# Patient Record
Sex: Male | Born: 1950 | Race: Black or African American | Hispanic: No | Marital: Married | Smoking: Current every day smoker
Health system: Southern US, Community
[De-identification: ages and names within clinical notes are randomized; demographics above are authoritative.]

## PROBLEM LIST (undated history)

## (undated) DIAGNOSIS — E559 Vitamin D deficiency, unspecified: Secondary | ICD-10-CM

## (undated) DIAGNOSIS — K219 Gastro-esophageal reflux disease without esophagitis: Secondary | ICD-10-CM

## (undated) DIAGNOSIS — E291 Testicular hypofunction: Secondary | ICD-10-CM

## (undated) DIAGNOSIS — F17210 Nicotine dependence, cigarettes, uncomplicated: Secondary | ICD-10-CM

## (undated) DIAGNOSIS — N528 Other male erectile dysfunction: Secondary | ICD-10-CM

## (undated) DIAGNOSIS — E119 Type 2 diabetes mellitus without complications: Secondary | ICD-10-CM

## (undated) DIAGNOSIS — E785 Hyperlipidemia, unspecified: Secondary | ICD-10-CM

## (undated) DIAGNOSIS — I10 Essential (primary) hypertension: Secondary | ICD-10-CM

## (undated) HISTORY — DX: Testicular hypofunction: E29.1

## (undated) HISTORY — DX: Vitamin D deficiency, unspecified: E55.9

## (undated) HISTORY — DX: Nicotine dependence, cigarettes, uncomplicated: F17.210

## (undated) HISTORY — DX: Other male erectile dysfunction: N52.8

---

## 1998-04-15 DIAGNOSIS — K279 Peptic ulcer, site unspecified, unspecified as acute or chronic, without hemorrhage or perforation: Secondary | ICD-10-CM

## 1998-04-15 HISTORY — DX: Peptic ulcer, site unspecified, unspecified as acute or chronic, without hemorrhage or perforation: K27.9

## 2009-07-14 ENCOUNTER — Ambulatory Visit: Payer: Self-pay | Admitting: Diagnostic Radiology

## 2009-07-14 ENCOUNTER — Emergency Department (HOSPITAL_BASED_OUTPATIENT_CLINIC_OR_DEPARTMENT_OTHER): Admission: EM | Admit: 2009-07-14 | Discharge: 2009-07-14 | Payer: Self-pay | Admitting: Emergency Medicine

## 2010-01-27 ENCOUNTER — Emergency Department (HOSPITAL_BASED_OUTPATIENT_CLINIC_OR_DEPARTMENT_OTHER): Admission: EM | Admit: 2010-01-27 | Discharge: 2010-01-27 | Payer: Self-pay | Admitting: Emergency Medicine

## 2010-06-27 LAB — URINE CULTURE
Colony Count: NO GROWTH
Culture: NO GROWTH

## 2010-06-27 LAB — URINALYSIS, ROUTINE W REFLEX MICROSCOPIC
Glucose, UA: NEGATIVE mg/dL
Nitrite: NEGATIVE
Urobilinogen, UA: 1 mg/dL (ref 0.0–1.0)
pH: 6 (ref 5.0–8.0)

## 2010-12-22 ENCOUNTER — Inpatient Hospital Stay (HOSPITAL_COMMUNITY)
Admission: AD | Admit: 2010-12-22 | Discharge: 2010-12-23 | DRG: 392 | Disposition: A | Discharge status: Home / Self Care | Payer: Self-pay | Source: Other Acute Inpatient Hospital | Attending: Internal Medicine | Admitting: Internal Medicine

## 2010-12-22 ENCOUNTER — Other Ambulatory Visit: Payer: Self-pay

## 2010-12-22 ENCOUNTER — Encounter: Payer: Self-pay | Admitting: Emergency Medicine

## 2010-12-22 ENCOUNTER — Emergency Department (HOSPITAL_BASED_OUTPATIENT_CLINIC_OR_DEPARTMENT_OTHER)
Admission: EM | Admit: 2010-12-22 | Discharge: 2010-12-22 | Disposition: A | Discharge status: Other Acute Inpatient Hospital | Payer: Self-pay | Attending: Emergency Medicine | Admitting: Emergency Medicine

## 2010-12-22 ENCOUNTER — Emergency Department (INDEPENDENT_AMBULATORY_CARE_PROVIDER_SITE_OTHER): Payer: Self-pay

## 2010-12-22 DIAGNOSIS — R112 Nausea with vomiting, unspecified: Secondary | ICD-10-CM

## 2010-12-22 DIAGNOSIS — K219 Gastro-esophageal reflux disease without esophagitis: Principal | ICD-10-CM | POA: Diagnosis present

## 2010-12-22 DIAGNOSIS — R61 Generalized hyperhidrosis: Secondary | ICD-10-CM

## 2010-12-22 DIAGNOSIS — E785 Hyperlipidemia, unspecified: Secondary | ICD-10-CM | POA: Insufficient documentation

## 2010-12-22 DIAGNOSIS — R109 Unspecified abdominal pain: Secondary | ICD-10-CM | POA: Insufficient documentation

## 2010-12-22 DIAGNOSIS — R0789 Other chest pain: Secondary | ICD-10-CM | POA: Diagnosis present

## 2010-12-22 DIAGNOSIS — F17200 Nicotine dependence, unspecified, uncomplicated: Secondary | ICD-10-CM | POA: Insufficient documentation

## 2010-12-22 DIAGNOSIS — R0602 Shortness of breath: Secondary | ICD-10-CM

## 2010-12-22 DIAGNOSIS — R079 Chest pain, unspecified: Secondary | ICD-10-CM | POA: Insufficient documentation

## 2010-12-22 HISTORY — DX: Gastro-esophageal reflux disease without esophagitis: K21.9

## 2010-12-22 HISTORY — DX: Hyperlipidemia, unspecified: E78.5

## 2010-12-22 LAB — HEPATIC FUNCTION PANEL
Albumin: 4 g/dL (ref 3.5–5.2)
Alkaline Phosphatase: 87 U/L (ref 39–117)
Indirect Bilirubin: 0.3 mg/dL (ref 0.3–0.9)

## 2010-12-22 LAB — LIPASE, BLOOD: Lipase: 69 U/L — ABNORMAL HIGH (ref 11–59)

## 2010-12-22 LAB — BASIC METABOLIC PANEL
GFR calc Af Amer: >60 mL/min (ref >60)
GFR calc non Af Amer: 57 mL/min — ABNORMAL LOW (ref >60)
Potassium: 4.6 mEq/L (ref 3.5–5.1)

## 2010-12-22 LAB — CARDIAC PANEL(CRET KIN+CKTOT+MB+TROPI)
Relative Index: 2.1 (ref 0.0–2.5)
Troponin I: <0.3 ng/mL (ref <0.30)

## 2010-12-22 LAB — CBC
HCT: 43.1 % (ref 39.0–52.0)
MCHC: 35.5 g/dL (ref 30.0–36.0)
MCV: 92.3 fL (ref 78.0–100.0)
Platelets: 145 10*3/uL — ABNORMAL LOW (ref 150–400)
RBC: 4.67 MIL/uL (ref 4.22–5.81)
RDW: 11.6 % (ref 11.5–15.5)
WBC: 3.3 10*3/uL — ABNORMAL LOW (ref 4.0–10.5)

## 2010-12-22 MED ORDER — ASPIRIN 325 MG PO TABS: 325.0000 mg | ORAL_TABLET | ORAL | Status: DC

## 2010-12-22 MED ORDER — ASPIRIN 81 MG PO CHEW
CHEWABLE_TABLET | ORAL | Status: AC
  Administered 2010-12-22: 324 mg via ORAL
  Filled 2010-12-22: qty 4

## 2010-12-22 MED ORDER — ASPIRIN 81 MG PO CHEW
324.0000 mg | CHEWABLE_TABLET | Freq: Once | ORAL | Status: AC
  Administered 2010-12-22: 324 mg via ORAL

## 2010-12-22 NOTE — ED Provider Notes (Signed)
History     CSN: [id number] Arrival date & time: 12/22/2010 12:08 PM  Chief Complaint  Patient presents with  . Abdominal Pain  . Chest Pain   HPI History provided by pt.  Pt woke w/ mild, sharp, non-radiating pain in left lateral chest this morning.  Only feels it if he moves or takes a deep breath.   Seems to worsen w/ exertion.  Currently has discomfort in left anterior chest.  Has had a cough productive of white sputum recently but no associated fever, SOB, diaphoresis, N/V.  Pt had an episode of "feeling bad" three days ago.  Did not have CP/SOB at that time but became diaphoretic, lightheaded and nauseous. Risk factors for MI include smoking and hyperlipidemia.  No risk factors for PE and denies LE pain/edema.  No known trauma.   Has also had intermittent pain in center of abd that is aggravated by eating spicy food x 2 weeks.  No vomiting or diarrhea.  H/o acid reflux and this feels similar.   Past Medical History  Diagnosis Date  . Hyperlipemia   . GERD (gastroesophageal reflux disease)     History reviewed. No pertinent past surgical history.  History reviewed. No pertinent family history.  History  Substance Use Topics  . Smoking status: Current Everyday Smoker -- 1.0 packs/day for 35 years    Types: Cigarettes  . Smokeless tobacco: Not on file  . Alcohol Use: 14.4 oz/week    24 Cans of beer per week     daily      Review of Systems  All other systems reviewed and are negative.    Physical Exam  BP 145/93  Pulse 84  Temp(Src) 98.3 F (36.8 C) (Oral)  Resp 22  Ht 6\' 3"  (1.905 m)  Wt 210 lb (95.255 kg)  BMI 26.25 kg/m2  SpO2 98%  Physical Exam  Nursing note and vitals reviewed. Constitutional: He is oriented to person, place, and time. He appears well-developed and well-nourished. No distress.  HENT:  Head: Normocephalic and atraumatic.  Eyes:       Normal appearance  Neck: Normal range of motion.  Cardiovascular: Normal rate and regular rhythm.     Pulmonary/Chest: Effort normal and breath sounds normal. No respiratory distress.       Mild left lateral chest ttp  Abdominal: Soft. Bowel sounds are normal. He exhibits no distension. There is no guarding.       Mild ttp of left upper/lower quadrant  Musculoskeletal: He exhibits no edema and no tenderness.       2+ Dorsalis Pedis pulses  Neurological: He is alert and oriented to person, place, and time.  Skin: Skin is warm and dry. No rash noted. He is not diaphoretic.  Psychiatric: He has a normal mood and affect. His behavior is normal.    Date: 12/23/2010  Rate: 86  Rhythm: normal sinus rhythm  QRS Axis: normal  Intervals: normal  ST/T Wave abnormalities: normal  Conduction Disutrbances:none  Narrative Interpretation:   Old EKG Reviewed: none available   ED Course  Procedures  MDM 59yo smoker w/ hyperlipidema, otherwise healthy, presents w/ pleuritic left lateral CP since waking this am.  No associated symptoms.  Had an episode of diaphoresis, nausea and lightheadedness 3 days ago.  No risk factors for PE, not tachycardic/tachypneic and no LE edema/ttp on exam.  CP is not reproducible w/ palpation and movement.  EKG w/out signs of ischemia, first troponin neg and labs otherwise unremarkable.  CXR shows  early CHF which is new since 07/2009.  VSS and pt currently feeling better.  Dr. Juleen China has seen pt and agrees that he should be evaluated further.  Will consult [facility] for admission b/c no CP protocol on weekend.      [facility] to admit for CP.  4:37 PM     Otilio Miu, PA 12/23/10 1121

## 2010-12-22 NOTE — ED Notes (Signed)
Family at bedside. Update on transfer to [facility]- pt denies pain- iv nsl

## 2010-12-22 NOTE — ED Notes (Signed)
Pending admission bed assignment- pt aware of admission

## 2010-12-22 NOTE — ED Notes (Signed)
Attempt to call report to [facility]- room assignment 1420 received- RN will call back when able to take report

## 2010-12-22 NOTE — ED Notes (Addendum)
Pt c/o generalized abdominal pain since Wednesday, started having chest pain along left ribs today.  Some diaphoresis, nausea and lightheadedness on Wed.  No Diarrhea.  Some SOB today.  No dizziness today.  Eating and drinking ok.  Gait steady.  No weakness today.  No recent travel.  No known fever.  Some productive cough, white sputum.  No peripheral edema.

## 2010-12-22 NOTE — ED Notes (Signed)
Report given to [name]

## 2010-12-23 LAB — CARDIAC PANEL(CRET KIN+CKTOT+MB+TROPI)
CK, MB: 2 ng/mL (ref 0.3–4.0)
CK, MB: 2 ng/mL (ref 0.3–4.0)
CK, MB: 2 ng/mL (ref 0.3–4.0)
Relative Index: INVALID (ref 0.0–2.5)
Relative Index: INVALID (ref 0.0–2.5)
Total CK: 95 U/L (ref 7–232)
Total CK: 89 U/L (ref 7–232)
Troponin I: <0.3 ng/mL (ref <0.30)
Troponin I: <0.3 ng/mL (ref <0.30)

## 2010-12-23 LAB — LIPASE, BLOOD: Lipase: 59 U/L (ref 11–59)

## 2010-12-23 LAB — LIPID PANEL
Cholesterol: 159 mg/dL (ref 0–200)
VLDL: 23 mg/dL (ref 0–40)

## 2010-12-23 LAB — CBC
HCT: 41.7 % (ref 39.0–52.0)
MCHC: 35 g/dL (ref 30.0–36.0)
MCV: 93.3 fL (ref 78.0–100.0)
RBC: 4.47 MIL/uL (ref 4.22–5.81)
WBC: 3.2 10*3/uL — ABNORMAL LOW (ref 4.0–10.5)

## 2010-12-23 NOTE — Discharge Summary (Signed)
NAMESHERLOCK, NANCARROW               ACCOUNT NO.:  [id number]  MEDICAL RECORD NO.:  [id number]  LOCATION:  1420                         FACILITY:  [facility]  PHYSICIAN:  Talmage Nap, MD  DATE OF BIRTH:  November 30, 1950  DATE OF ADMISSION:  12/22/2010 DATE OF DISCHARGE:  12/23/2010                        DISCHARGE SUMMARY - REFERRING   PRIMARY CARE PHYSICIAN:  None.  DISCHARGE DIAGNOSES: 1. Chest pain, most likely noncardiac.  Cardiac enzymes are negative.     The patient to have stress test to be arranged as outpatient when     appointment is made for him to follow up at [facility]. 2. Gastroesophageal reflux disease. 3. Hyperlipidemia.  HISTORY OF PRESENT ILLNESS:  The patient is a 60 year old African American male with history of GERD and hyperlipidemia, and also chronic tobacco use, who was admitted to the hospital on December 22, 2010, by Dr. Calvert Cantor with complaints of chest pain.  The patient described pain as constant, located in the left chest and was reproducible.  He denied any associated shortness of breath.  He denied any radiation of pain.  He denied any diaphoresis, nausea, or vomiting.  No fever, no chills, no rigor.  The patient claimed he took Aleve, pain did not improve; hence [facility].  Before the [facility] got [facility], pain had resolved and at [facility], he was given aspirin and subsequently asked to come to [facility] for further evaluation.  MEDICATIONS:  Preadmission med without dosages include Nexium.  SOCIAL HISTORY:  The patient smokes about a pack of cigarettes per day for the past 30-35 years and drinks 2 ounces of beer every evening, and works in a funeral home.  FAMILY HISTORY:  York Spaniel to be positive for lung carcinoma and throat carcinoma.  Past surgical history and review of systems, please refer to the initial history and physical dictated by Dr. Calvert Cantor.  PHYSICAL EXAMINATION:  At the time the  patient was seen by the admitting physician; VITAL SIGNS:  Temperature was 98.3, pulse 70, respiratory 18, blood pressure 166/89, and pulse ox was 99%.  Repeat of blood pressure was 136/84. GENERAL:  He was said not to be in any distress. HEENT:  Pupils are reactive to light and extraocular muscles are intact. NECK:  He had no jugular venous distention, no carotid bruit, and no lymphadenopathy. CHEST:  Clear to auscultation. HEART:  Sounds are 1 and 2. ABDOMEN:  Soft, nontender.  Liver, spleen, kidney not palpable.  Bowel sounds are positive. EXTREMITIES:  No pedal edema. NEUROLOGIC:  Exam was nonfocal. MUSCULOSKELETAL SYSTEM:  Unremarkable. SKIN:  Normal turgor.  LAB DATA:  Initial complete blood count with no differential showed WBC of 3.3, hemoglobin of 15.3, hematocrit of 43.1, MCV of 92.3, platelet count of 145.  Basic metabolic panel showed sodium of 139, potassium of 4.2, chloride of 102, a bicarb of 30, glucose is 94, BUN is 14, creatinine is 1.30.  Three set of cardiac markers troponin-I less than 0.30, less than 0.30, less than 0.30, all were within normal range and CK-MB was normal as well.  LFT normal.  Lipase is 69, slightly elevated and a repeat lipase  done on December 23, 2010, was 5.  Lipid panel was essentially unremarkable and a repeat CBC done on December 23, 2010, showed WBC of 3.2, hemoglobin of 14.6, hematocrit of 41.7, MCV of 93.2, with a platelet count of 149.  EKG done on admission was said to have showed normal sinus rhythm with a rate of 86 beats per minute.  No ST wave change noted.  Chest x-ray by my own interpretation was essentially normal; however, a partial report showed borderline cardiomegaly with mild changes of congestive heart failure (I do not believe that this is not true).  HOSPITAL COURSE:  The patient was admitted to telemetry, was given Zofran for nausea, Ambien for insomnia.  Pain control was done with Tylenol and hydrocodone/APAP.   He was also given a nicotine transdermal patch.  The patient was seen by me for the very first time in this index admission today which is December 23, 2010, chest pain-free; however, according to the patient, when he developed chest pain initially, it was reproducible.  Presently, the patient is chest pain-free.  He denies any shortness of breath, no diaphoresis, or palpitations.  Examination was essentially unremarkable.  Vital Signs:  Blood pressure is 146/81, pulse 69, respiratory 18, temperature is 98.2.  The patient is, however, medically stable.  PLAN:  The patient to be discharged home today on activity as tolerated. Low-sodium, low-cholesterol diet.  Appointment will be made for the patient to follow up at [facility] that is arranged by the case manager.  DISCHARGE MEDICATIONS:  Medication to be taken at home include: 1. Aspirin 81 mg p.o. daily. 2. Hydrocodone/APAP 5/325 1-2 tablets p.o. q.4 p.r.n. 3. Nitroglycerin 0.4 mg sublingual q.5 minutes x3 p.r.n. 4. Protonix 40 mg p.o. daily. 5. Zocor 20 mg p.o. daily.     Talmage Nap, MD     CN/MEDQ  D:  12/23/2010  T:  12/23/2010  Job:  [id number]  cc:   [facility] Fax: [phone]  Electronically Signed by Talmage Nap  on 12/23/2010 06:56:09 PM

## 2011-01-03 NOTE — ED Provider Notes (Signed)
Medical screening examination/treatment/procedure(s) were performed by non-physician practitioner and as supervising physician I was immediately available for consultation/collaboration.  Raeford Razor, MD 01/03/11 [phone]

## 2011-01-30 NOTE — H&P (Signed)
Nathaniel Nicholson, Nathaniel Nicholson               ACCOUNT NO.:  [id number]  MEDICAL RECORD NO.:  [id number]  LOCATION:  1420                         FACILITY:  [facility]  PHYSICIAN:  Calvert Cantor, M.D.     DATE OF BIRTH:  [birth year]  DATE OF ADMISSION:  12/22/2010 DATE OF DISCHARGE:                             HISTORY & PHYSICAL   PRIMARY CARE PHYSICIAN:  None.  PRESENTING COMPLAINT:  Chest pain.  HISTORY OF PRESENT ILLNESS:  This is a 60 year old male with a history of hypercholesterolemia, gastroesophageal reflux disease, and smoking. The patient states he woke up this morning with left-sided chest pain. He points to the very side of his left chest, almost towards the axillary line.  The patient states that it was constant.  He took some Aleve for it, but it did not improve and, therefore, went to [facility].  At [facility], he was evaluated and referred for admission.  The patient states that the pain is gone.  He only received 4 baby aspirin at [facility].  He did not receive any nitroglycerin. The pain was not associated with any shortness of breath, diaphoresis, or nausea.  He did note that when he had the pain, he felt like he had some trouble moving his left arm as well and may have had some trouble taking a deep breath.  A few days ago, he did have an episode where he went into the bank to pick up some mail and developed diaphoresis, nausea, and felt like he was going to pass out.  He may have been short of breath, but he cannot recall.  He came in and sat in his car, rested, and then drove home and went to sleep for a few hours.  When he woke up, his symptoms had resolved and did not return.  However, he has been feeling a little unwell since then.  When asked about a cough, he admits to having a cough productive of whitish-colored sputum.  PAST MEDICAL HISTORY: 1. Hyperlipidemia.  He takes a cholesterol pill that starts with a P. 2. Gastroesophageal  reflux disease.  He takes Nexium when he can go to     a doctor, get a prescription, and afford to buy the medication.  PAST SURGICAL HISTORY:  Boil removed from his left arm.  SOCIAL HISTORY:  Smokes about a pack a day, has been smoking for 30-35 years.  He works in a funeral home.  He is married.  Drinks two 40 ounces of beer every evening.  FAMILY HISTORY:  Father had lung cancer.  Brother had throat cancer.  MEDICATIONS: 1. Vitamins 2. Cholesterol pill that starts with a P.  ALLERGIES:  No known drug allergies.  REVIEW OF SYSTEMS:  Weight is about the same.  No frequent headaches. No blurred vision, double vision, sore throat, sinus trouble, or earache.  RESPIRATORY:  Positive for cough with sputum.  No wheezing. No shortness of breath.  CARDIAC:  Chest pain as mentioned in HPI.  No palpitations or pedal edema.  GI:  Has reflux and when he cannot afford his Nexium, this is bad.  GU:  No dysuria, hematuria, or incontinence. HEMATOLOGIC:  No easy bruising.  SKIN:  No rash.  MUSCULOSKELETAL:  Has pain across his shoulders sometimes.  NEUROLOGIC:  No history of stroke, seizure, or mini strokes.  No numbness or tingling.  PSYCHOLOGIC:  No anxiety or depression.  PHYSICAL EXAMINATION:  VITAL SIGNS:  Temperature 98.3, pulse 70, respirations 18, blood pressure 166/89, pulse ox 99% on room air.  Blood pressure over at [facility] was 136/84. HEENT:  Pupils equal, round, reactive to light.  Extraocular movements are intact.  Conjunctivae are pink.  No scleral icterus.  Oral mucosa moist. NECK:  Supple.  No thyromegaly, lymphadenopathy, or carotid bruits. HEART:  Regular rate and rhythm.  No murmurs, rubs, or gallops. LUNGS:  Clear bilaterally.  Normal respiratory effort.  No use of accessory muscles. ABDOMEN:  Soft, nontender, nondistended.  Bowel sounds positive.  No organomegaly. CHEST:  There is no reproducible tenderness in the area.  No reproducible pain when he moves  his left arm, but he states he still feels sore. EXTREMITIES:  No cyanosis, clubbing, or edema.  Pedal pulses positive. NEUROLOGIC:  Cranial nerves II through XII intact.  Strength intact in all 4 extremities. PSYCHOLOGIC:  Awake, alert, oriented x3.  Mood and affect normal. SKIN:  Warm and dry.  No rash or bruising.  BLOOD WORK:  WBC count is 3.3, platelets 145, lipase is very mildly elevated at 69.  Point-of-care markers is negative.  EKG, sinus rhythm at 86 beats per minute with no ST or T-wave changes.  ASSESSMENT AND PLAN: 1. Chest pain, sounds quite musculoskeletal.  He states that he still     feels some soreness and is asking me for pain medicine.  I will     order some Vicodin for him.  We will check 3 sets of cardiac     enzymes.  I will also check a lipid profile and repeat his CBC and     lipase as there are some abnormalities present.  We will monitor     him on telemetry.  I will not order a stress test as of yet.  I     have told him if chest pain continues, then he should follow up     with a PCP for further investigations and possibly a stress test.     He does not have any insurance and, therefore, we can most likely     give him a number and an appointment with [facility]. 2. Hyperlipidemia.  I suspect he takes pravastatin for this.  We can     give him a prescription if he is running low. 3. Gastroesophageal reflux disease.  I recommended that he try over-     the-counter Prilosec, but he looks hesitant to believe that it will     work.  We may need to give him a prescription for Nexium at     discharge. 4. Mild leukopenia. 5. Mild thrombocytopenia. 6. Mildly elevated lipase.  We will be repeating blood work as mentioned to recheck those abnormal labs.  The patient will need the number for [facility] on discharge.  Time on the patient's care was 45 minutes.     Calvert Cantor, M.D.     SR/MEDQ  D:  12/22/2010  T:  12/22/2010  Job:   [id number]  Electronically Signed by Calvert Cantor M.D. on 01/30/2011 57:84:69 PM

## 2011-05-21 ENCOUNTER — Emergency Department (INDEPENDENT_AMBULATORY_CARE_PROVIDER_SITE_OTHER): Payer: Self-pay

## 2011-05-21 ENCOUNTER — Emergency Department (HOSPITAL_BASED_OUTPATIENT_CLINIC_OR_DEPARTMENT_OTHER)
Admission: EM | Admit: 2011-05-21 | Discharge: 2011-05-21 | Disposition: A | Discharge status: Home / Self Care | Payer: Self-pay | Attending: Emergency Medicine | Admitting: Emergency Medicine

## 2011-05-21 ENCOUNTER — Encounter (HOSPITAL_BASED_OUTPATIENT_CLINIC_OR_DEPARTMENT_OTHER): Payer: Self-pay | Admitting: *Deleted

## 2011-05-21 DIAGNOSIS — M25469 Effusion, unspecified knee: Secondary | ICD-10-CM

## 2011-05-21 DIAGNOSIS — M25569 Pain in unspecified knee: Secondary | ICD-10-CM | POA: Insufficient documentation

## 2011-05-21 DIAGNOSIS — F17200 Nicotine dependence, unspecified, uncomplicated: Secondary | ICD-10-CM | POA: Insufficient documentation

## 2011-05-21 DIAGNOSIS — Z79899 Other long term (current) drug therapy: Secondary | ICD-10-CM | POA: Insufficient documentation

## 2011-05-21 DIAGNOSIS — K219 Gastro-esophageal reflux disease without esophagitis: Secondary | ICD-10-CM | POA: Insufficient documentation

## 2011-05-21 DIAGNOSIS — M25562 Pain in left knee: Secondary | ICD-10-CM

## 2011-05-21 DIAGNOSIS — E785 Hyperlipidemia, unspecified: Secondary | ICD-10-CM | POA: Insufficient documentation

## 2011-05-21 DIAGNOSIS — M79609 Pain in unspecified limb: Secondary | ICD-10-CM

## 2011-05-21 MED ORDER — IBUPROFEN 800 MG PO TABS
800.0000 mg | ORAL_TABLET | Freq: Once | ORAL | Status: AC
  Administered 2011-05-21: 800 mg via ORAL
  Filled 2011-05-21: qty 1

## 2011-05-21 MED ORDER — OXYCODONE-ACETAMINOPHEN 5-325 MG PO TABS
1.0000 | ORAL_TABLET | Freq: Four times a day (QID) | ORAL | Status: AC | PRN
Start: 2011-05-21 — End: 2011-05-31

## 2011-05-21 MED ORDER — NAPROXEN 500 MG PO TABS: 500.0000 mg | ORAL_TABLET | Freq: Two times a day (BID) | ORAL | Status: AC

## 2011-05-21 MED ORDER — OXYCODONE-ACETAMINOPHEN 5-325 MG PO TABS
2.0000 | ORAL_TABLET | Freq: Once | ORAL | Status: AC
  Administered 2011-05-21: 2 via ORAL
  Filled 2011-05-21: qty 2

## 2011-05-21 NOTE — ED Provider Notes (Signed)
History     CSN: [id number]  Arrival date & time 05/21/11  1619   First MD Initiated Contact with Patient 05/21/11 1710      Chief Complaint  Patient presents with  . Knee Pain    left    (Consider location/radiation/quality/duration/timing/severity/associated sxs/prior treatment) HPI Comments: Gradually worsening left knee swelling. No known injury. Denies chest pain, shortness of breath. No prior history of arthritis or injury to this knee. Denies paresthesias  Patient is a 61 y.o. male presenting with knee pain. The history is provided by the patient. No language interpreter was used.  Knee Pain This is a new problem. The current episode started more than 2 days ago (4 days). The problem occurs constantly. The problem has been gradually worsening. Pertinent negatives include no chest pain, no abdominal pain, no headaches and no shortness of breath. The symptoms are aggravated by walking and bending. The symptoms are relieved by nothing. He has tried acetaminophen and rest for the symptoms. The treatment provided no relief.    Past Medical History  Diagnosis Date  . Hyperlipemia   . GERD (gastroesophageal reflux disease)     History reviewed. No pertinent past surgical history.  No family history on file.  History  Substance Use Topics  . Smoking status: Current Everyday Smoker -- 1.0 packs/day for 35 years    Types: Cigarettes  . Smokeless tobacco: Not on file  . Alcohol Use: 3.6 oz/week    6 Cans of beer per week     daily      Review of Systems  Constitutional: Negative for fever, activity change, appetite change and fatigue.  HENT: Negative for congestion, sore throat, rhinorrhea, neck pain and neck stiffness.   Respiratory: Negative for cough and shortness of breath.   Cardiovascular: Negative for chest pain and palpitations.  Gastrointestinal: Negative for nausea, vomiting and abdominal pain.  Genitourinary: Negative for dysuria, urgency, frequency and flank  pain.  Musculoskeletal: Positive for myalgias, joint swelling and arthralgias. Negative for back pain.  Neurological: Negative for dizziness, weakness, light-headedness, numbness and headaches.  All other systems reviewed and are negative.    Allergies  Review of patient's allergies indicates no known allergies.  Home Medications   Current Outpatient Rx  Name Route Sig Dispense Refill  . CYANOCOBALAMIN 100 MCG PO TABS Oral Take 100 mcg by mouth daily.      . IBUPROFEN 200 MG PO TABS Oral Take 400 mg by mouth every 6 (six) hours as needed. For pain    . ONE-DAILY MULTI VITAMINS PO TABS Oral Take 1 tablet by mouth daily.      Marland Kitchen NAPHAZOLINE HCL 0.012 % OP SOLN Both Eyes Place 1 drop into both eyes daily.      Marland Kitchen FISH OIL 1000 MG PO CAPS Oral Take 1 capsule by mouth 3 (three) times daily.      Marland Kitchen OMEPRAZOLE 40 MG PO CPDR Oral Take 40 mg by mouth daily.      Marland Kitchen PRAVASTATIN SODIUM 40 MG PO TABS Oral Take 40 mg by mouth daily.      Marland Kitchen RANITIDINE HCL 75 MG PO TABS Oral Take 75 mg by mouth daily as needed. Acid reflux     . VITAMIN C 500 MG PO TABS Oral Take 500 mg by mouth daily.      Marland Kitchen NAPROXEN 500 MG PO TABS Oral Take 1 tablet (500 mg total) by mouth 2 (two) times daily. 30 tablet 0  . OXYCODONE-ACETAMINOPHEN 5-325 MG PO  TABS Oral Take 1-2 tablets by mouth every 6 (six) hours as needed for pain. 20 tablet 0    BP 138/83  Pulse 98  Temp(Src) 99.4 F (37.4 C) (Oral)  Resp 20  Ht 6\' 3"  (1.905 m)  Wt 210 lb (95.255 kg)  BMI 26.25 kg/m2  SpO2 99%  Physical Exam  Nursing note and vitals reviewed. Constitutional: He is oriented to person, place, and time. He appears well-developed and well-nourished. No distress.  HENT:  Head: Normocephalic and atraumatic.  Mouth/Throat: Oropharynx is clear and moist.  Eyes: Conjunctivae and EOM are normal. Pupils are equal, round, and reactive to light.  Neck: Normal range of motion. Neck supple.  Cardiovascular: Normal rate, regular rhythm, normal heart  sounds and intact distal pulses.  Exam reveals no gallop and no friction rub.   No murmur heard. Pulmonary/Chest: Effort normal and breath sounds normal. No respiratory distress.  Abdominal: Soft. Bowel sounds are normal. There is no tenderness.  Musculoskeletal:       Left knee: He exhibits decreased range of motion, swelling, effusion and bony tenderness. He exhibits no ecchymosis, no deformity, no LCL laxity and normal patellar mobility. tenderness found. Medial joint line and lateral joint line tenderness noted. No MCL and no LCL tenderness noted.       Legs: Neurological: He is alert and oriented to person, place, and time. No cranial nerve deficit.  Skin: Skin is warm and dry. No rash noted.    ED Course  Procedures (including critical care time)  Labs Reviewed - No data to display Dg Knee 1-2 Views Left  05/21/2011  *RADIOLOGY REPORT*  Clinical Data: Pain and swelling  LEFT KNEE - 1-2 VIEW  Comparison: None.  Findings: Small effusion in the suprapatellar bursa.  Negative for fracture, dislocation, or other acute bone injury.  No significant osseous degenerative change.  Normal mineralization and alignment.  IMPRESSION:  Small effusion.  No bony abnormality.  Original Report Authenticated By: Osa Craver, M.D.   US Venous Img Lower Unilateral Left  05/21/2011  *RADIOLOGY REPORT*  Clinical Data: Leg pain.  LEFT LOWER EXTREMITY VENOUS DUPLEX ULTRASOUND  Technique:  Gray-scale sonography with graded compression, as well as color Doppler and duplex ultrasound were performed to evaluate the deep venous system of the lower extremity from the level of the common femoral vein through the popliteal and proximal calf veins. Spectral Doppler was utilized to evaluate flow at rest and with distal augmentation maneuvers.  Comparison:  None.  Findings:  Normal compressibility of the common femoral, superficial femoral, and popliteal veins is demonstrated, as well as the visualized proximal calf  veins.  No filling defects to suggest DVT on grayscale or color Doppler imaging.  Doppler waveforms show normal direction of venous flow, normal respiratory phasicity and response to augmentation.  IMPRESSION: No evidence of lower extremity deep vein thrombosis.  Original Report Authenticated By: Elsie Stain, M.D.     1. Knee pain, left       MDM  No erythema or warmth to suggest a septic joint. He is a mild joint effusion. There is tenderness at the joint lines. Concern he has some early arthritis. DVT study ordered history is negative for DVT. He be placed in the immobilizer provided crutches. Chart and followup with sports medicine in one week. I encouraged him to take the anti-inflammatory medication scheduled for one week. I also encouraged him to apply ice several times daily for 15-20 minutes at a time to help  with swelling. Provided clear signs and symptoms for which to return        Dayton Bailiff, MD 05/21/11 [phone]

## 2011-05-21 NOTE — ED Notes (Signed)
Patient states he has had left knee pain for the last 4 days.  Pain and swelling has worsened over the last 2 days and is not in his left calf.  No known injury.

## 2012-08-06 ENCOUNTER — Emergency Department (HOSPITAL_BASED_OUTPATIENT_CLINIC_OR_DEPARTMENT_OTHER)
Admission: EM | Admit: 2012-08-06 | Discharge: 2012-08-06 | Disposition: A | Discharge status: Home / Self Care | Payer: Self-pay | Attending: Emergency Medicine | Admitting: Emergency Medicine

## 2012-08-06 ENCOUNTER — Encounter (HOSPITAL_BASED_OUTPATIENT_CLINIC_OR_DEPARTMENT_OTHER): Payer: Self-pay | Admitting: *Deleted

## 2012-08-06 ENCOUNTER — Emergency Department (HOSPITAL_BASED_OUTPATIENT_CLINIC_OR_DEPARTMENT_OTHER): Payer: Self-pay

## 2012-08-06 ENCOUNTER — Other Ambulatory Visit: Payer: Self-pay

## 2012-08-06 DIAGNOSIS — K219 Gastro-esophageal reflux disease without esophagitis: Secondary | ICD-10-CM | POA: Insufficient documentation

## 2012-08-06 DIAGNOSIS — E785 Hyperlipidemia, unspecified: Secondary | ICD-10-CM | POA: Insufficient documentation

## 2012-08-06 DIAGNOSIS — R0789 Other chest pain: Secondary | ICD-10-CM

## 2012-08-06 DIAGNOSIS — Z7982 Long term (current) use of aspirin: Secondary | ICD-10-CM | POA: Insufficient documentation

## 2012-08-06 DIAGNOSIS — F17200 Nicotine dependence, unspecified, uncomplicated: Secondary | ICD-10-CM | POA: Insufficient documentation

## 2012-08-06 DIAGNOSIS — R071 Chest pain on breathing: Secondary | ICD-10-CM | POA: Insufficient documentation

## 2012-08-06 DIAGNOSIS — Z79899 Other long term (current) drug therapy: Secondary | ICD-10-CM | POA: Insufficient documentation

## 2012-08-06 LAB — URINALYSIS, ROUTINE W REFLEX MICROSCOPIC
Leukocytes, UA: NEGATIVE
Protein, ur: NEGATIVE mg/dL
Urobilinogen, UA: 0.2 mg/dL (ref 0.0–1.0)

## 2012-08-06 LAB — CBC WITH DIFFERENTIAL/PLATELET
Basophils Relative: 0 % (ref 0–1)
Eosinophils Absolute: 0.1 10*3/uL (ref 0.0–0.7)
Lymphs Abs: 1.3 10*3/uL (ref 0.7–4.0)
MCH: 33.5 pg (ref 26.0–34.0)
Neutrophils Relative %: 44 % (ref 43–77)
Platelets: 131 10*3/uL — ABNORMAL LOW (ref 150–400)
RBC: 4.83 MIL/uL (ref 4.22–5.81)
WBC: 3.2 10*3/uL — ABNORMAL LOW (ref 4.0–10.5)

## 2012-08-06 LAB — BASIC METABOLIC PANEL
GFR calc Af Amer: >90 mL/min (ref >90)
GFR calc non Af Amer: >90 mL/min (ref >90)
Glucose, Bld: 168 mg/dL — ABNORMAL HIGH (ref 70–99)
Potassium: 4.2 mEq/L (ref 3.5–5.1)
Sodium: 138 mEq/L (ref 135–145)

## 2012-08-06 MED ORDER — IBUPROFEN 800 MG PO TABS: 800.0000 mg | ORAL_TABLET | Freq: Three times a day (TID) | ORAL | Status: DC

## 2012-08-06 MED ORDER — ASPIRIN 81 MG PO CHEW
324.0000 mg | CHEWABLE_TABLET | Freq: Once | ORAL | Status: AC
  Administered 2012-08-06: 324 mg via ORAL
  Filled 2012-08-06: qty 4

## 2012-08-06 NOTE — ED Notes (Signed)
Sharp left lateral chest pain x 2 weeks. Denies arm pain. Sore feels a little sore for 2 weeks. Cough for a while but pain with cough at onset of chest pain.

## 2012-08-06 NOTE — ED Provider Notes (Signed)
History     CSN: [id number]  Arrival date & time 08/06/12  1058   First MD Initiated Contact with Patient 08/06/12 1151      Chief Complaint  Patient presents with  . Chest Pain    (Consider location/radiation/quality/duration/timing/severity/associated sxs/prior treatment) Patient is a 62 y.o. male presenting with chest pain.  Chest Pain  Pt reports about 2 weeks of persistent mild sharp pain to bilateral lateral ribs, worse with coughing, denies SOB, sternal chest pain. No fever. He is a smoker, denies history of CAD, HTN or DM.   Also reports mild aching pain in R groin for over a month. Has had urinary hesitancy for that same time frame but no dysuria or swelling. He reports increased frequency the last few days.   Past Medical History  Diagnosis Date  . Hyperlipemia   . GERD (gastroesophageal reflux disease)     History reviewed. No pertinent past surgical history.  No family history on file.  History  Substance Use Topics  . Smoking status: Current Every Day Smoker -- 1.00 packs/day for 35 years    Types: Cigarettes  . Smokeless tobacco: Not on file  . Alcohol Use: 3.6 oz/week    6 Cans of beer per week     Comment: daily      Review of Systems  Cardiovascular: Positive for chest pain.   All other systems reviewed and are negative except as noted in HPI.   Allergies  Review of patient's allergies indicates no known allergies.  Home Medications   Current Outpatient Rx  Name  Route  Sig  Dispense  Refill  . aspirin 81 MG tablet   Oral   Take 81 mg by mouth daily.         . cyanocobalamin 100 MCG tablet   Oral   Take 100 mcg by mouth daily.           Marland Kitchen ibuprofen (ADVIL,MOTRIN) 200 MG tablet   Oral   Take 400 mg by mouth every 6 (six) hours as needed. For pain         . Multiple Vitamin (MULTIVITAMIN) tablet   Oral   Take 1 tablet by mouth daily.           . naphazoline (CLEAR EYES) 0.012 % ophthalmic solution   Both Eyes   Place 1  drop into both eyes daily.           . Omega-3 Fatty Acids (FISH OIL) 1000 MG CAPS   Oral   Take 1 capsule by mouth 3 (three) times daily.           Marland Kitchen omeprazole (PRILOSEC) 40 MG capsule   Oral   Take 40 mg by mouth daily.           . pravastatin (PRAVACHOL) 40 MG tablet   Oral   Take 40 mg by mouth daily.           . ranitidine (ZANTAC) 75 MG tablet   Oral   Take 75 mg by mouth daily as needed. Acid reflux          . vitamin C (ASCORBIC ACID) 500 MG tablet   Oral   Take 500 mg by mouth daily.             BP 139/86  Pulse 84  Temp(Src) 98.4 F (36.9 C) (Oral)  Resp 18  SpO2 99%  Physical Exam  Nursing note and vitals reviewed. Constitutional: He is oriented to person,  place, and time. He appears well-developed and well-nourished.  HENT:  Head: Normocephalic and atraumatic.  Eyes: EOM are normal. Pupils are equal, round, and reactive to light.  Neck: Normal range of motion. Neck supple.  Cardiovascular: Normal rate, normal heart sounds and intact distal pulses.   Pulmonary/Chest: Effort normal and breath sounds normal. He exhibits no tenderness.  Abdominal: Bowel sounds are normal. He exhibits no distension. There is no tenderness.  Genitourinary:  No hernias, no testicular pain or swelling, no penile sores or discharge  Musculoskeletal: Normal range of motion. He exhibits no edema and no tenderness.  Neurological: He is alert and oriented to person, place, and time. He has normal strength. No cranial nerve deficit or sensory deficit.  Skin: Skin is warm and dry. No rash noted.  Psychiatric: He has a normal mood and affect.    ED Course  Procedures (including critical care time)  Labs Reviewed  CBC WITH DIFFERENTIAL - Abnormal; Notable for the following:    WBC 3.2 (*)    RDW 11.3 (*)    Platelets 131 (*)    Neutro Abs 1.4 (*)    Monocytes Relative 13 (*)    All other components within normal limits  BASIC METABOLIC PANEL - Abnormal; Notable for  the following:    Glucose, Bld 168 (*)    All other components within normal limits  URINALYSIS, ROUTINE W REFLEX MICROSCOPIC - Abnormal; Notable for the following:    Specific Gravity, Urine 1.004 (*)    All other components within normal limits  TROPONIN I   Dg Chest 2 View  08/06/2012  *RADIOLOGY REPORT*  Clinical Data: Cough and chest pain  CHEST - 2 VIEW  Comparison: December 22, 2010  Findings:  Lungs are clear except for mild stable right apical pleural thickening.  Heart size and pulmonary vascularity are normal.  No adenopathy.  No bone lesions.  IMPRESSION: No edema or consolidation.  Stable mild right apical pleural thickening.   Original Report Authenticated By: Bretta Bang, M.D.      1. Chest wall pain       MDM   Date: 08/06/2012  Rate: 87  Rhythm: normal sinus rhythm  QRS Axis: normal  Intervals: normal  ST/T Wave abnormalities: normal  Conduction Disutrbances: none  Narrative Interpretation: unremarkable   1:55 PM Labs and imaging unremarkable. Pain is atypical for ACS and ongoing for 2 weeks. EKG shows no signs of ischemia. NSAIDs for chest wall pain. Advised to stop smoking. No signs of UTI or acute testicular process to account for his urinary symptoms. Advised PCP followup.           Lilana Blasko B. Bernette Mayers, MD 08/06/12 1356

## 2014-07-12 IMAGING — CR DG CHEST 2V
2 series · 2 of 2 positions shown · non-contrast
Comparison: December 22, 2010

CLINICAL DATA: Cough and chest pain

CHEST - 2 VIEW

[w chest pa]
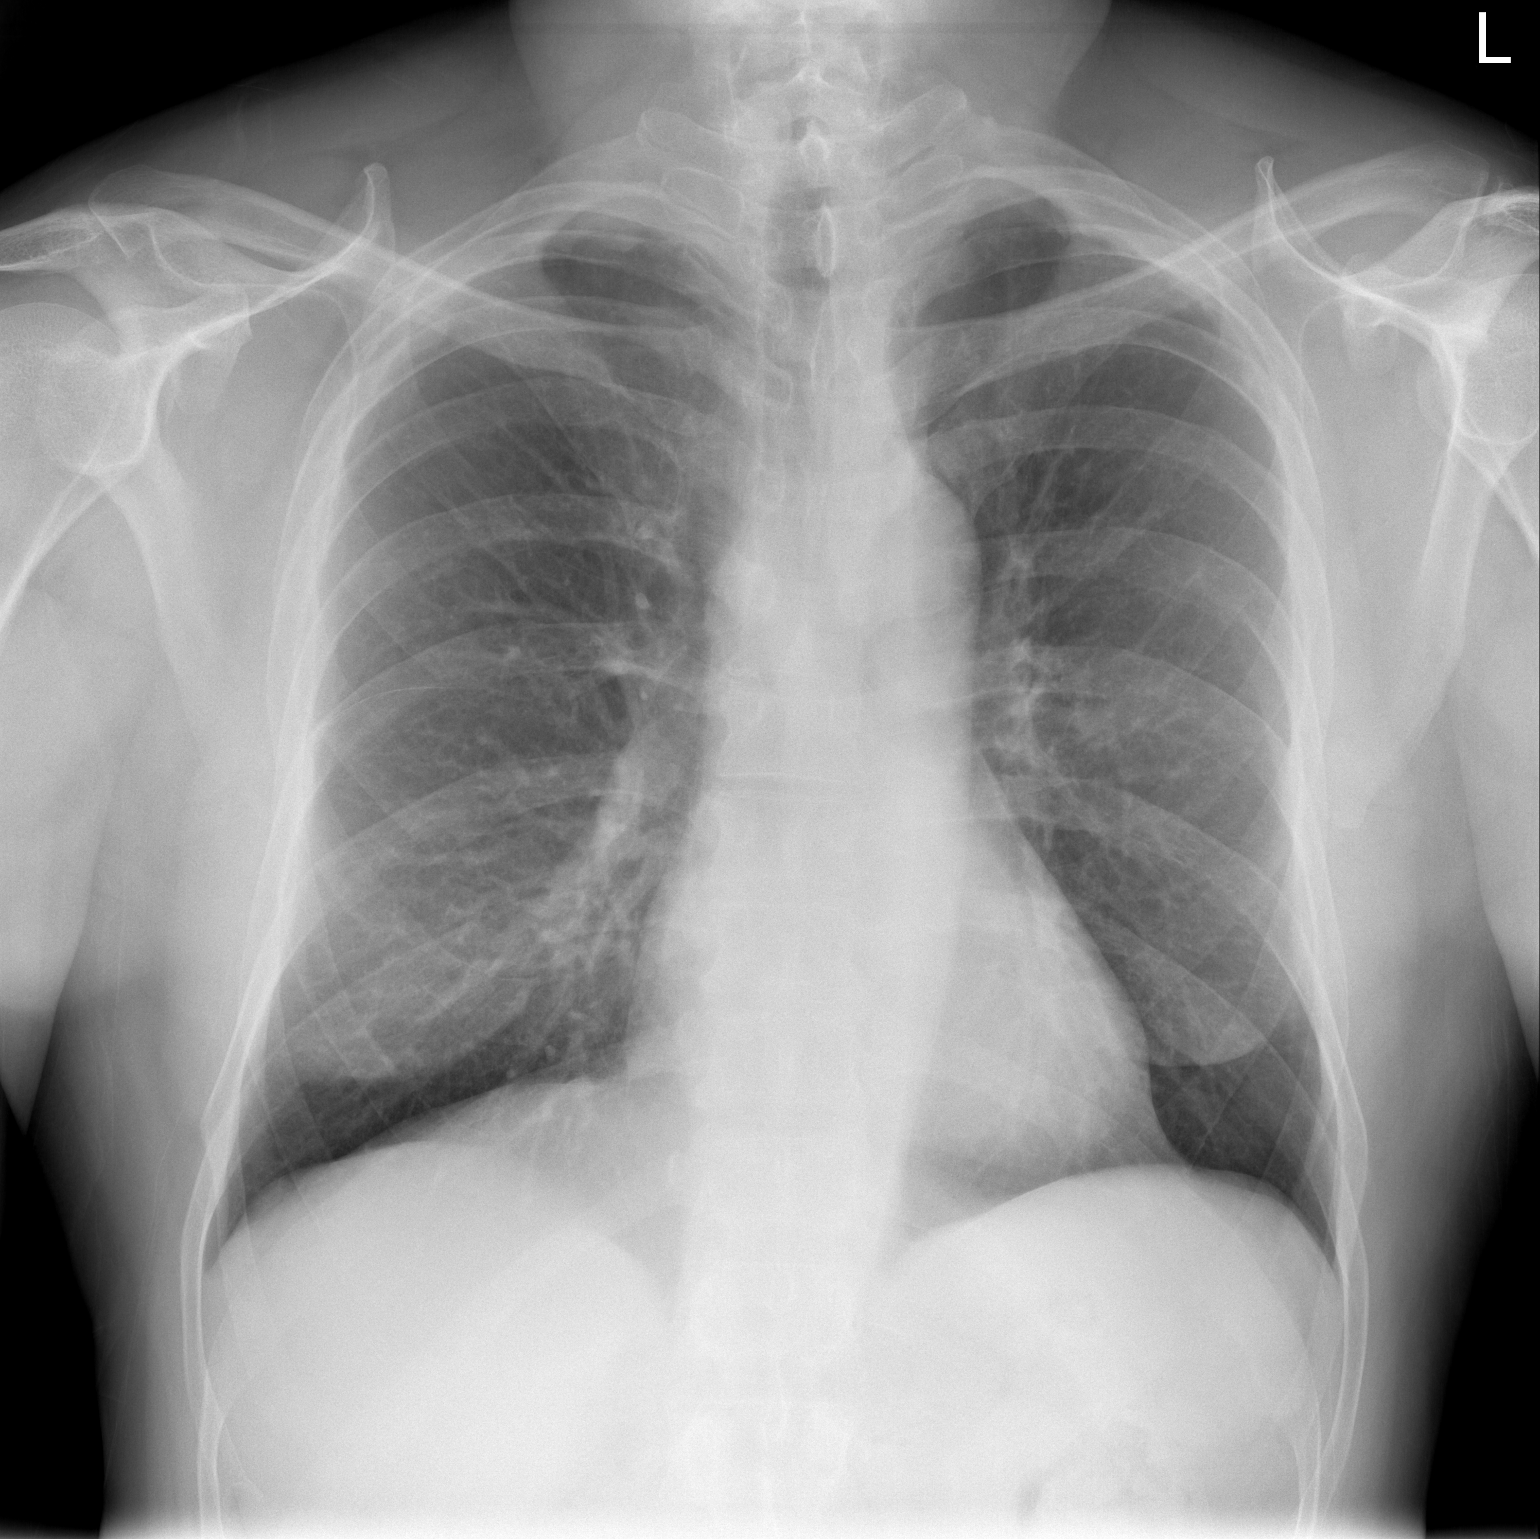

[w chest lat]
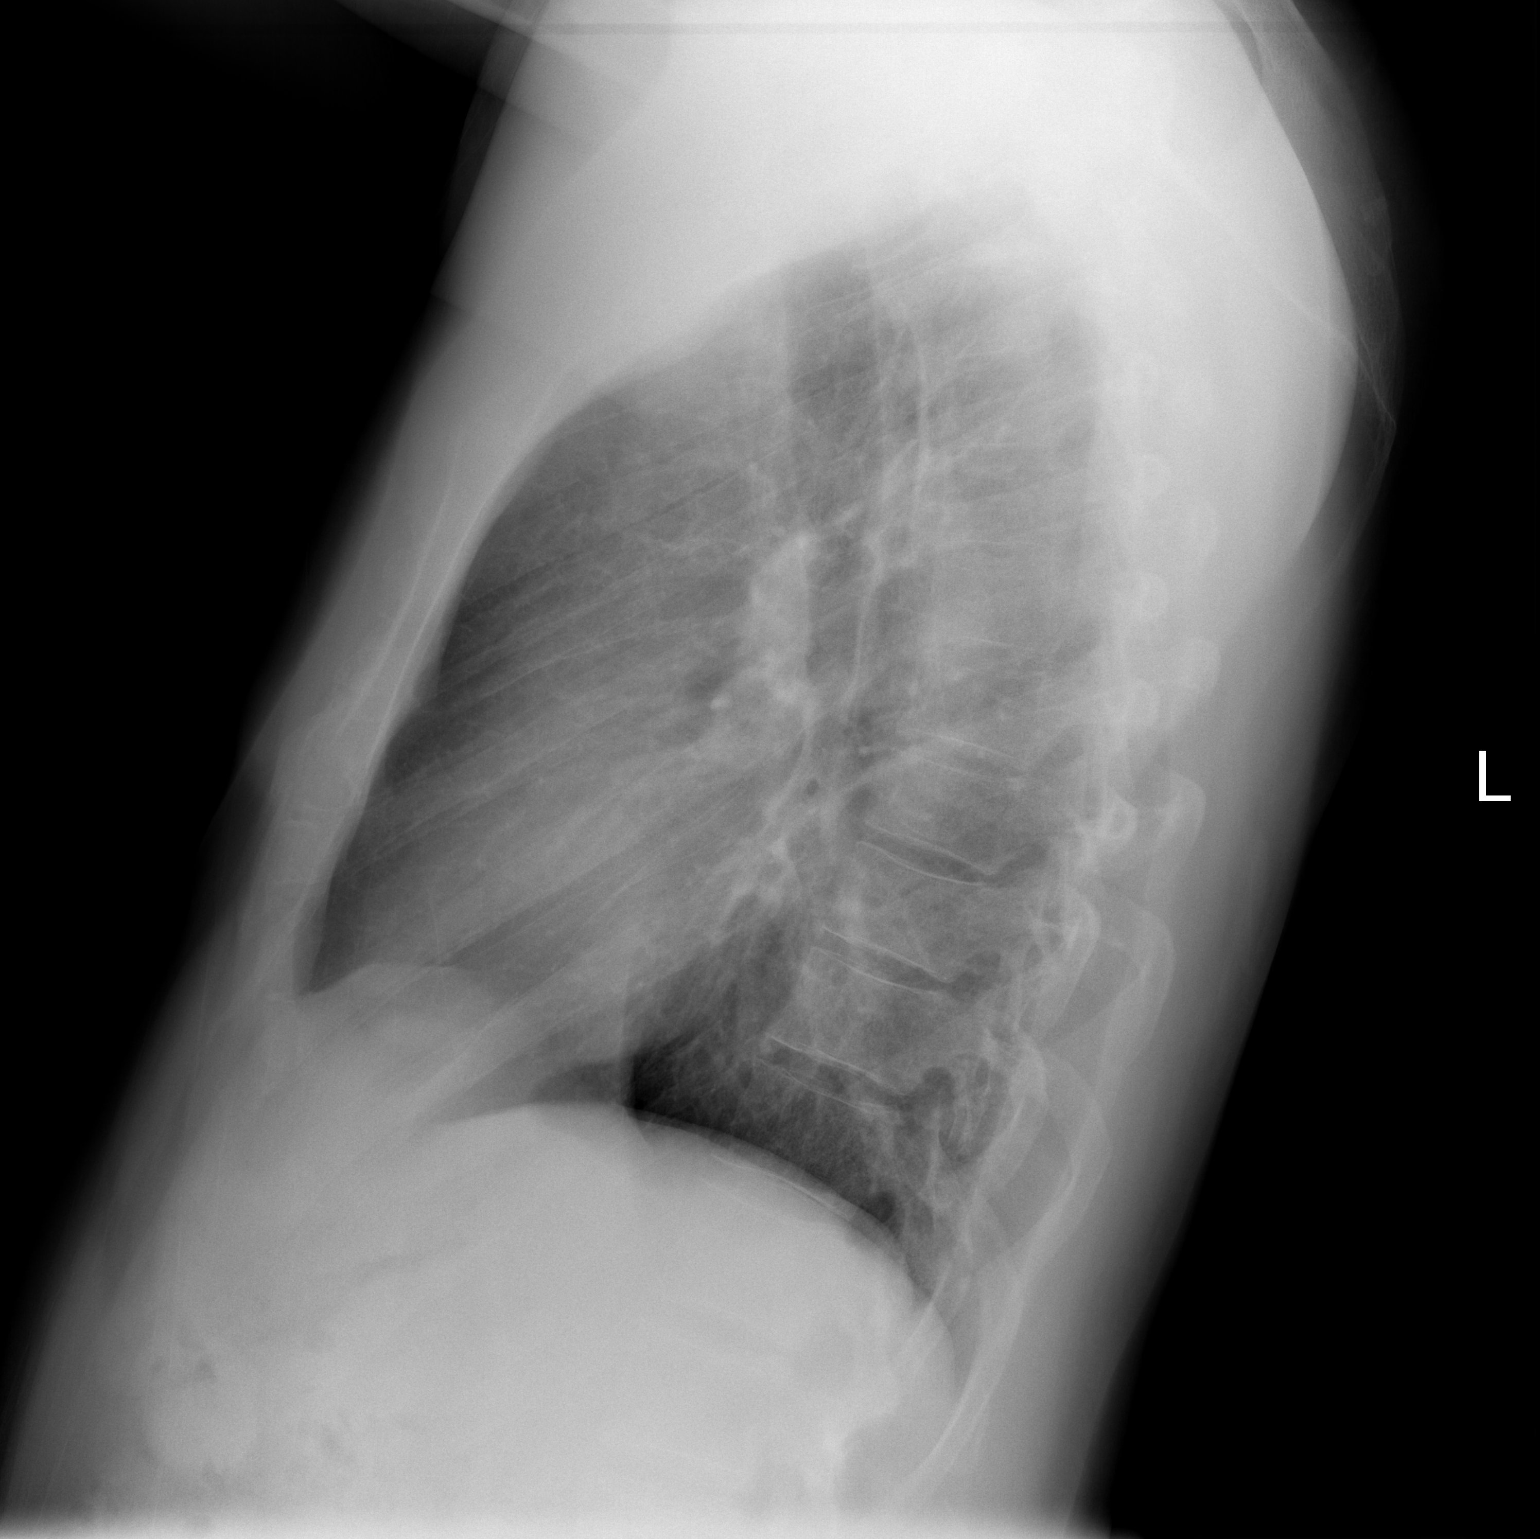

[2 of 2 positions shown; findings below may reference images not displayed]

FINDINGS: Lungs are clear except for mild stable right apical
pleural thickening.  Heart size and pulmonary vascularity are
normal.  No adenopathy.  No bone lesions.
IMPRESSION: No edema or consolidation.  Stable mild right apical
pleural thickening.

## 2015-04-16 HISTORY — PX: CATARACT EXTRACTION: SUR2

## 2015-08-28 DIAGNOSIS — H5213 Myopia, bilateral: Secondary | ICD-10-CM | POA: Insufficient documentation

## 2015-08-28 DIAGNOSIS — H2510 Age-related nuclear cataract, unspecified eye: Secondary | ICD-10-CM | POA: Insufficient documentation

## 2016-04-03 DIAGNOSIS — K219 Gastro-esophageal reflux disease without esophagitis: Secondary | ICD-10-CM | POA: Insufficient documentation

## 2016-08-06 ENCOUNTER — Emergency Department (HOSPITAL_BASED_OUTPATIENT_CLINIC_OR_DEPARTMENT_OTHER)
Admission: EM | Admit: 2016-08-06 | Discharge: 2016-08-06 | Disposition: A | Discharge status: Home / Self Care | Payer: Medicare Other | Attending: Emergency Medicine | Admitting: Emergency Medicine

## 2016-08-06 ENCOUNTER — Encounter (HOSPITAL_BASED_OUTPATIENT_CLINIC_OR_DEPARTMENT_OTHER): Payer: Self-pay | Admitting: Emergency Medicine

## 2016-08-06 DIAGNOSIS — E1165 Type 2 diabetes mellitus with hyperglycemia: Secondary | ICD-10-CM | POA: Diagnosis present

## 2016-08-06 DIAGNOSIS — F17210 Nicotine dependence, cigarettes, uncomplicated: Secondary | ICD-10-CM | POA: Insufficient documentation

## 2016-08-06 DIAGNOSIS — R739 Hyperglycemia, unspecified: Secondary | ICD-10-CM

## 2016-08-06 HISTORY — DX: Type 2 diabetes mellitus without complications: E11.9

## 2016-08-06 LAB — CBC WITH DIFFERENTIAL/PLATELET
BASOS PCT: 0 %
Basophils Absolute: 0 10*3/uL (ref 0.0–0.1)
EOS ABS: 0 10*3/uL (ref 0.0–0.7)
EOS PCT: 1 %
HCT: 41.5 % (ref 39.0–52.0)
Hemoglobin: 14.8 g/dL (ref 13.0–17.0)
LYMPHS ABS: 1.3 10*3/uL (ref 0.7–4.0)
Lymphocytes Relative: 42 %
MCH: 32.7 pg (ref 26.0–34.0)
MCHC: 35.7 g/dL (ref 30.0–36.0)
MCV: 91.8 fL (ref 78.0–100.0)
MONO ABS: 0.4 10*3/uL (ref 0.1–1.0)
Monocytes Relative: 11 %
Neutro Abs: 1.5 10*3/uL — ABNORMAL LOW (ref 1.7–7.7)
Neutrophils Relative %: 46 %
PLATELETS: 116 10*3/uL — AB (ref 150–400)
RBC: 4.52 MIL/uL (ref 4.22–5.81)
RDW: 10.6 % — AB (ref 11.5–15.5)
WBC: 3.2 10*3/uL — ABNORMAL LOW (ref 4.0–10.5)

## 2016-08-06 LAB — COMPREHENSIVE METABOLIC PANEL
ALT: 34 U/L (ref 17–63)
AST: 32 U/L (ref 15–41)
Albumin: 3.7 g/dL (ref 3.5–5.0)
Alkaline Phosphatase: 83 U/L (ref 38–126)
Anion gap: 13 (ref 5–15)
BUN: 22 mg/dL — ABNORMAL HIGH (ref 6–20)
CHLORIDE: 95 mmol/L — AB (ref 101–111)
CO2: 24 mmol/L (ref 22–32)
Calcium: 9.6 mg/dL (ref 8.9–10.3)
Creatinine, Ser: 1.21 mg/dL (ref 0.61–1.24)
Glucose, Bld: 450 mg/dL — ABNORMAL HIGH (ref 65–99)
POTASSIUM: 4.7 mmol/L (ref 3.5–5.1)
SODIUM: 132 mmol/L — AB (ref 135–145)
Total Bilirubin: 0.9 mg/dL (ref 0.3–1.2)
Total Protein: 6.9 g/dL (ref 6.5–8.1)

## 2016-08-06 LAB — URINALYSIS, ROUTINE W REFLEX MICROSCOPIC
BILIRUBIN URINE: NEGATIVE
Glucose, UA: >=500 mg/dL — AB
HGB URINE DIPSTICK: NEGATIVE
Leukocytes, UA: NEGATIVE
NITRITE: NEGATIVE
Protein, ur: NEGATIVE mg/dL
SPECIFIC GRAVITY, URINE: 1.036 — AB (ref 1.005–1.030)
pH: 5.5 (ref 5.0–8.0)

## 2016-08-06 LAB — URINALYSIS, MICROSCOPIC (REFLEX)
RBC / HPF: NONE SEEN RBC/hpf (ref 0–5)
WBC UA: NONE SEEN WBC/hpf (ref 0–5)

## 2016-08-06 LAB — CBG MONITORING, ED
GLUCOSE-CAPILLARY: 481 mg/dL — AB (ref 65–99)
GLUCOSE-CAPILLARY: 352 mg/dL — AB (ref 65–99)

## 2016-08-06 MED ORDER — SODIUM CHLORIDE 0.9 % IV BOLUS (SEPSIS)
1000.0000 mL | Freq: Once | INTRAVENOUS | Status: AC
  Administered 2016-08-06: 1000 mL via INTRAVENOUS

## 2016-08-06 NOTE — Discharge Instructions (Signed)
Continue taking your Metformin twice daily with meals as directed.  Continue following low-carb, low-sugar diabetic diet.  Follow up with your primary care provider at your scheduled appointment next week.  Return to ER for new or worsening symptoms, any additional concerns.

## 2016-08-06 NOTE — ED Triage Notes (Signed)
Pt dx with type 2 diabetes yesterday and started on metformin. Pt states sugar was in the 400's today. Pt reports feeling weak and having blurred vision.

## 2016-08-06 NOTE — ED Notes (Signed)
ED Provider at bedside to review labs and discharge instructions.

## 2016-08-06 NOTE — ED Provider Notes (Signed)
[facility]   CSN: [id number] Arrival date & time: 08/06/16  1751   By signing my name below, I, Clarisse Gouge, attest that this documentation has been prepared under the direction and in the presence of Tilden Fossa, MD. Electronically signed, Clarisse Gouge, ED Scribe. 08/06/16. 6:14 PM.   History   Chief Complaint Chief Complaint  Patient presents with  . Hyperglycemia   The history is provided by the patient and medical records. No language interpreter was used.    Nathaniel Nicholson is a 66 y.o. male with hx of GERD, HLD, DM2 who presents to the Emergency Department with concerned for persistent fatigue and weakness. Patient notes blurred vision, fatigue, weakness, excessive thirst, and weight loss over a 2-3 week period. He was seen by PCP yesterday and diagnosed with diabetes. CBG at that time in the high 300's. He was started on Metformin and took two doses (last night and this morning). He notes he is here today because he feels a little more tired and weak than yesterday. CBG at home was 478. He has had no worsening in blurry vision since this started 2-3 weeks ago. No headache or dizziness. Last ate = cheeseburger ~12:15 today. No abdominal pain, leg swelling, SOB, chest pain, fevers, h/o HTN or any other complaints noted at this time.  Past Medical History:  Diagnosis Date  . Diabetes mellitus without complication (HCC)   . GERD (gastroesophageal reflux disease)   . Hyperlipemia     There are no active problems to display for this patient.   History reviewed. No pertinent surgical history.     Home Medications    Prior to Admission medications   Medication Sig Start Date End Date Taking? Authorizing Provider  esomeprazole (NEXIUM) 40 MG capsule Take 40 mg by mouth daily at 12 noon.   Yes Historical Provider, MD  metFORMIN (GLUCOPHAGE) 1000 MG tablet Take 1,000 mg by mouth 2 (two) times daily with a meal.   Yes Historical Provider, MD    aspirin 81 MG tablet Take 81 mg by mouth daily.    Historical Provider, MD  ibuprofen (ADVIL,MOTRIN) 800 MG tablet Take 1 tablet (800 mg total) by mouth 3 (three) times daily. 08/06/12   Susy Frizzle, MD    Family History No family history on file.  Social History Social History  Substance Use Topics  . Smoking status: Current Every Day Smoker    Packs/day: 1.00    Years: 35.00    Types: Cigarettes  . Smokeless tobacco: Never Used  . Alcohol use 3.6 oz/week    6 Cans of beer per week     Comment: daily     Allergies   Patient has no known allergies.   Review of Systems Review of Systems  Constitutional: Positive for appetite change and fatigue.  Eyes: Positive for visual disturbance.  Respiratory: Negative for shortness of breath.   Cardiovascular: Negative for chest pain and leg swelling.  Gastrointestinal: Negative for abdominal pain.  Endocrine: Positive for polydipsia.  Neurological: Negative for dizziness and headaches.  All other systems reviewed and are negative.    Physical Exam Updated Vital Signs BP (!) 150/97 (BP Location: Left Arm)   Pulse (!) 102   Temp 98.2 F (36.8 C) (Oral)   Resp 20   Ht  (1.905 m)   Wt 196 lb (88.9 kg)   SpO2 99%   BMI 24.50 kg/m   Physical Exam  Constitutional: He is oriented to person, place,  and time. He appears well-developed and well-nourished. No distress.  HENT:  Head: Normocephalic and atraumatic.  Eyes: Conjunctivae and EOM are normal. Pupils are equal, round, and reactive to light.  No visual field deficits  Cardiovascular: Normal rate, regular rhythm and normal heart sounds.   No murmur heard. Pulmonary/Chest: Effort normal and breath sounds normal. No respiratory distress.  Abdominal: Soft. He exhibits no distension. There is no tenderness.  Musculoskeletal: He exhibits no edema.  5/5 muscle strength in all 4 extremities  Neurological: He is alert and oriented to person, place, and time.  Skin:  Skin is warm and dry.  Nursing note and vitals reviewed.    ED Treatments / Results  DIAGNOSTIC STUDIES: Oxygen Saturation is 99% on RA, NL by my interpretation.    COORDINATION OF CARE: 6:11 PM-Discussed next steps with pt. Pt verbalized understanding and is agreeable with the plan. Will order blood work, labs and IV fluids.   Labs (all labs ordered are listed, but only abnormal results are displayed) Labs Reviewed  URINALYSIS, ROUTINE W REFLEX MICROSCOPIC - Abnormal; Notable for the following:       Result Value   Specific Gravity, Urine 1.036 (*)    Glucose, UA >=500 (*)    Ketones, ur >80 (*)    All other components within normal limits  CBC WITH DIFFERENTIAL/PLATELET - Abnormal; Notable for the following:    WBC 3.2 (*)    RDW 10.6 (*)    Platelets 116 (*)    Neutro Abs 1.5 (*)    All other components within normal limits  COMPREHENSIVE METABOLIC PANEL - Abnormal; Notable for the following:    Sodium 132 (*)    Chloride 95 (*)    Glucose, Bld 450 (*)    BUN 22 (*)    All other components within normal limits  URINALYSIS, MICROSCOPIC (REFLEX) - Abnormal; Notable for the following:    Bacteria, UA RARE (*)    Squamous Epithelial / LPF 0-5 (*)    All other components within normal limits  CBG MONITORING, ED - Abnormal; Notable for the following:    Glucose-Capillary 481 (*)    All other components within normal limits  CBG MONITORING, ED - Abnormal; Notable for the following:    Glucose-Capillary 352 (*)    All other components within normal limits  CBC WITH DIFFERENTIAL/PLATELET    EKG  EKG Interpretation None       Radiology No results found.  Procedures Procedures (including critical care time)  Medications Ordered in ED Medications  sodium chloride 0.9 % bolus 1,000 mL (0 mLs Intravenous Stopped 08/06/16 2027)     Initial Impression / Assessment and Plan / ED Course  I have reviewed the triage vital signs and the nursing notes.  Pertinent labs  & imaging results that were available during my care of the patient were reviewed by me and considered in my medical decision making (see chart for details).    Nathaniel Nicholson is a 66 y.o. male who presents to ED for hyperglycemia. Recently diagnosed with diabetes and started on Metformin yesterday. Patient states that he has had no improvement in his symptoms (blurred vision, fatigue, polydipsia) since starting the medication. No worsening of symptoms either. Patient not in DKA today. 1L IV fluids given. On reevaluation, patient feels much improved. CBG down to 352. Feels comfortable with discharge to home. Has an appointment with PCP scheduled for next week and was encouraged to keep this scheduled appointment. Reasons to return to  ER discussed and all questions answered.   Final Clinical Impressions(s) / ED Diagnoses   Final diagnoses:  Hyperglycemia    New Prescriptions Discharge Medication List as of 08/06/2016  9:26 PM     I personally performed the services described in this documentation, which was scribed in my presence. The recorded information has been reviewed and is accurate.    [facility], PA-C 08/06/16 1610    Tilden Fossa, MD 08/07/16 [phone]

## 2016-08-06 NOTE — ED Notes (Signed)
Patient denies pain and is resting comfortably.  

## 2017-01-07 ENCOUNTER — Encounter (HOSPITAL_BASED_OUTPATIENT_CLINIC_OR_DEPARTMENT_OTHER): Payer: Self-pay | Admitting: *Deleted

## 2017-01-07 ENCOUNTER — Emergency Department (HOSPITAL_BASED_OUTPATIENT_CLINIC_OR_DEPARTMENT_OTHER)
Admission: EM | Admit: 2017-01-07 | Discharge: 2017-01-07 | Disposition: A | Discharge status: Home / Self Care | Payer: Medicare Other | Attending: Emergency Medicine | Admitting: Emergency Medicine

## 2017-01-07 DIAGNOSIS — L239 Allergic contact dermatitis, unspecified cause: Secondary | ICD-10-CM

## 2017-01-07 DIAGNOSIS — Z79899 Other long term (current) drug therapy: Secondary | ICD-10-CM | POA: Diagnosis not present

## 2017-01-07 DIAGNOSIS — E119 Type 2 diabetes mellitus without complications: Secondary | ICD-10-CM | POA: Insufficient documentation

## 2017-01-07 DIAGNOSIS — F17210 Nicotine dependence, cigarettes, uncomplicated: Secondary | ICD-10-CM | POA: Diagnosis not present

## 2017-01-07 DIAGNOSIS — R21 Rash and other nonspecific skin eruption: Secondary | ICD-10-CM | POA: Diagnosis present

## 2017-01-07 DIAGNOSIS — Z7984 Long term (current) use of oral hypoglycemic drugs: Secondary | ICD-10-CM | POA: Diagnosis not present

## 2017-01-07 DIAGNOSIS — Z7982 Long term (current) use of aspirin: Secondary | ICD-10-CM | POA: Insufficient documentation

## 2017-01-07 LAB — BASIC METABOLIC PANEL
Anion gap: 7 (ref 5–15)
BUN: 13 mg/dL (ref 6–20)
CHLORIDE: 99 mmol/L — AB (ref 101–111)
CO2: 28 mmol/L (ref 22–32)
CREATININE: 1.24 mg/dL (ref 0.61–1.24)
Calcium: 9.2 mg/dL (ref 8.9–10.3)
GFR calc non Af Amer: 60 mL/min — ABNORMAL LOW (ref >60)
Glucose, Bld: 115 mg/dL — ABNORMAL HIGH (ref 65–99)
POTASSIUM: 3.9 mmol/L (ref 3.5–5.1)
SODIUM: 134 mmol/L — AB (ref 135–145)

## 2017-01-07 LAB — CBC
HCT: 43.2 % (ref 39.0–52.0)
Hemoglobin: 15.1 g/dL (ref 13.0–17.0)
MCH: 32.3 pg (ref 26.0–34.0)
MCHC: 35 g/dL (ref 30.0–36.0)
MCV: 92.5 fL (ref 78.0–100.0)
PLATELETS: 113 10*3/uL — AB (ref 150–400)
RBC: 4.67 MIL/uL (ref 4.22–5.81)
RDW: 11.6 % (ref 11.5–15.5)
WBC: 2.8 10*3/uL — ABNORMAL LOW (ref 4.0–10.5)

## 2017-01-07 MED ORDER — DIPHENHYDRAMINE HCL 50 MG/ML IJ SOLN
25.0000 mg | Freq: Once | INTRAMUSCULAR | Status: AC
  Administered 2017-01-07: 25 mg via INTRAVENOUS
  Filled 2017-01-07: qty 1

## 2017-01-07 MED ORDER — METHYLPREDNISOLONE SODIUM SUCC 125 MG IJ SOLR
125.0000 mg | Freq: Once | INTRAMUSCULAR | Status: AC
  Administered 2017-01-07: 125 mg via INTRAVENOUS
  Filled 2017-01-07: qty 2

## 2017-01-07 MED ORDER — HYDROXYZINE HCL 25 MG PO TABS
25.0000 mg | ORAL_TABLET | Freq: Once | ORAL | Status: AC
  Administered 2017-01-07: 25 mg via ORAL
  Filled 2017-01-07: qty 1

## 2017-01-07 MED ORDER — PREDNISONE 10 MG (21) PO TBPK: ORAL_TABLET | ORAL | 0 refills | Status: DC

## 2017-01-07 MED ORDER — FAMOTIDINE IN NACL 20-0.9 MG/50ML-% IV SOLN
20.0000 mg | Freq: Once | INTRAVENOUS | Status: AC
  Administered 2017-01-07: 20 mg via INTRAVENOUS
  Filled 2017-01-07: qty 50

## 2017-01-07 MED ORDER — HYDROXYZINE HCL 25 MG PO TABS: 25.0000 mg | ORAL_TABLET | Freq: Four times a day (QID) | ORAL | 0 refills | Status: DC

## 2017-01-07 NOTE — ED Notes (Signed)
Pt verbalizes understanding of d/c instructions and denies any further needs at this time. 

## 2017-01-07 NOTE — ED Notes (Signed)
Pt stated that he noticed a red rash to his right lateral LE.  He was mowing his lawn Friday and Saturday.  He noticed that his rashes is all over and itches.  Wife gave Benadryl Monday night and pt slept all night.    Pt received Famotidine, Benadryl and Solu-Medrol and stated that he still itches a little bit.

## 2017-01-07 NOTE — ED Triage Notes (Signed)
Rash x 3 days since working in the yard.

## 2017-01-07 NOTE — ED Notes (Signed)
Red elevated rashes all over his body.

## 2017-01-07 NOTE — ED Provider Notes (Signed)
[facility]   CSN: [id number] Arrival date & time: 01/07/17  1815     History   Chief Complaint Chief Complaint  Patient presents with  . Rash    HPI Nathaniel Nicholson is a 66 y.o. male.  Pt presents to the ED today with a rash all over his body.  He was working in the yard on Saturday the 22nd.  He noticed a red spot on his right lower leg yesterday.  The rash spread all over his body yesterday.  He took a benadryl last night, but the rash is still there now.  The pt denies f/c.  No other sx.      Past Medical History:  Diagnosis Date  . Diabetes mellitus without complication (HCC)   . GERD (gastroesophageal reflux disease)   . Hyperlipemia     There are no active problems to display for this patient.   History reviewed. No pertinent surgical history.     Home Medications    Prior to Admission medications   Medication Sig Start Date End Date Taking? Authorizing Provider  aspirin 81 MG tablet Take 81 mg by mouth daily.    [provider]  esomeprazole (NEXIUM) 40 MG capsule Take 40 mg by mouth daily at 12 noon.    [provider]  hydrOXYzine (ATARAX/VISTARIL) 25 MG tablet Take 1 tablet (25 mg total) by mouth every 6 (six) hours. 01/07/17   Jacalyn Lefevre, MD  ibuprofen (ADVIL,MOTRIN) 800 MG tablet Take 1 tablet (800 mg total) by mouth 3 (three) times daily. 08/06/12   Susy Frizzle, MD  metFORMIN (GLUCOPHAGE) 1000 MG tablet Take 1,000 mg by mouth 2 (two) times daily with a meal.    [provider]  predniSONE (STERAPRED UNI-PAK 21 TAB) 10 MG (21) TBPK tablet Take 6 tabs by mouth daily  for 2 days, then 5 tabs for 2 days, then 4 tabs for 2 days, then 3 tabs for 2 days, 2 tabs for 2 days, then 1 tab by mouth daily for 2 days 01/07/17   Jacalyn Lefevre, MD    Family History No family history on file.  Social History Social History  Substance Use Topics  . Smoking status: Current Every Day Smoker    Packs/day:  1.00    Years: 35.00    Types: Cigarettes  . Smokeless tobacco: Never Used  . Alcohol use 3.6 oz/week    6 Cans of beer per week     Comment: daily     Allergies   Patient has no known allergies.   Review of Systems Review of Systems  Skin: Positive for rash.  All other systems reviewed and are negative.    Physical Exam Updated Vital Signs BP (!) 153/95   Pulse (!) 104   Temp 99.1 F (37.3 C) (Oral)   Resp 20   Ht  (1.905 m)   Wt 93 kg (205 lb)   SpO2 99%   BMI 25.62 kg/m   Physical Exam  Constitutional: He is oriented to person, place, and time. He appears well-developed and well-nourished.  HENT:  Head: Normocephalic and atraumatic.  Right Ear: External ear normal.  Left Ear: External ear normal.  Nose: Nose normal.  Mouth/Throat: Oropharynx is clear and moist.  Eyes: Pupils are equal, round, and reactive to light. Conjunctivae and EOM are normal.  Neck: Normal range of motion. Neck supple.  Cardiovascular: Normal rate, regular rhythm, normal heart sounds and intact distal pulses.   Pulmonary/Chest:  Effort normal and breath sounds normal.  Abdominal: Soft. Bowel sounds are normal.  Musculoskeletal: Normal range of motion.  Neurological: He is alert and oriented to person, place, and time.  Skin: Skin is warm. Rash noted.  Psychiatric: He has a normal mood and affect. His behavior is normal. Judgment and thought content normal.  Nursing note and vitals reviewed.    ED Treatments / Results  Labs (all labs ordered are listed, but only abnormal results are displayed) Labs Reviewed  BASIC METABOLIC PANEL - Abnormal; Notable for the following:       Result Value   Sodium 134 (*)    Chloride 99 (*)    Glucose, Bld 115 (*)    GFR calc non Af Amer 60 (*)    All other components within normal limits  CBC - Abnormal; Notable for the following:    WBC 2.8 (*)    Platelets 113 (*)    All other components within normal limits    EKG  EKG  Interpretation None       Radiology No results found.  Procedures Procedures (including critical care time)  Medications Ordered in ED Medications  diphenhydrAMINE (BENADRYL) injection 25 mg (25 mg Intravenous Given 01/07/17 1846)  methylPREDNISolone sodium succinate (SOLU-MEDROL) 125 mg/2 mL injection 125 mg (125 mg Intravenous Given 01/07/17 1844)  famotidine (PEPCID) IVPB 20 mg premix (0 mg Intravenous Stopped 01/07/17 1937)  hydrOXYzine (ATARAX/VISTARIL) tablet 25 mg (25 mg Oral Given 01/07/17 1936)     Initial Impression / Assessment and Plan / ED Course  I have reviewed the triage vital signs and the nursing notes.  Pertinent labs & imaging results that were available during my care of the patient were reviewed by me and considered in my medical decision making (see chart for details).     Rash has improved.  He is much less itchy.  He knows to return if worse.  Final Clinical Impressions(s) / ED Diagnoses   Final diagnoses:  Allergic contact dermatitis, unspecified trigger    New Prescriptions New Prescriptions   HYDROXYZINE (ATARAX/VISTARIL) 25 MG TABLET    Take 1 tablet (25 mg total) by mouth every 6 (six) hours.   PREDNISONE (STERAPRED UNI-PAK 21 TAB) 10 MG (21) TBPK TABLET    Take 6 tabs by mouth daily  for 2 days, then 5 tabs for 2 days, then 4 tabs for 2 days, then 3 tabs for 2 days, 2 tabs for 2 days, then 1 tab by mouth daily for 2 days     Jacalyn Lefevre, MD 01/07/17 2038

## 2017-05-02 DIAGNOSIS — H401122 Primary open-angle glaucoma, left eye, moderate stage: Secondary | ICD-10-CM | POA: Diagnosis not present

## 2017-05-02 DIAGNOSIS — H401111 Primary open-angle glaucoma, right eye, mild stage: Secondary | ICD-10-CM | POA: Diagnosis not present

## 2017-05-02 DIAGNOSIS — H40042 Steroid responder, left eye: Secondary | ICD-10-CM | POA: Diagnosis not present

## 2017-05-15 DIAGNOSIS — L84 Corns and callosities: Secondary | ICD-10-CM | POA: Diagnosis not present

## 2017-05-15 DIAGNOSIS — B354 Tinea corporis: Secondary | ICD-10-CM | POA: Diagnosis not present

## 2017-05-30 DIAGNOSIS — E559 Vitamin D deficiency, unspecified: Secondary | ICD-10-CM | POA: Diagnosis not present

## 2017-05-30 DIAGNOSIS — K219 Gastro-esophageal reflux disease without esophagitis: Secondary | ICD-10-CM | POA: Diagnosis not present

## 2017-05-30 DIAGNOSIS — D6959 Other secondary thrombocytopenia: Secondary | ICD-10-CM | POA: Diagnosis not present

## 2017-05-30 DIAGNOSIS — R69 Illness, unspecified: Secondary | ICD-10-CM | POA: Diagnosis not present

## 2017-05-30 DIAGNOSIS — Z125 Encounter for screening for malignant neoplasm of prostate: Secondary | ICD-10-CM | POA: Diagnosis not present

## 2017-05-30 DIAGNOSIS — E782 Mixed hyperlipidemia: Secondary | ICD-10-CM | POA: Diagnosis not present

## 2017-05-30 DIAGNOSIS — E1142 Type 2 diabetes mellitus with diabetic polyneuropathy: Secondary | ICD-10-CM | POA: Diagnosis not present

## 2017-05-30 DIAGNOSIS — N528 Other male erectile dysfunction: Secondary | ICD-10-CM | POA: Diagnosis not present

## 2017-05-30 DIAGNOSIS — I10 Essential (primary) hypertension: Secondary | ICD-10-CM | POA: Diagnosis not present

## 2017-05-30 DIAGNOSIS — R194 Change in bowel habit: Secondary | ICD-10-CM | POA: Diagnosis not present

## 2017-06-06 DIAGNOSIS — M79671 Pain in right foot: Secondary | ICD-10-CM | POA: Diagnosis not present

## 2017-06-06 DIAGNOSIS — M79661 Pain in right lower leg: Secondary | ICD-10-CM | POA: Diagnosis not present

## 2017-07-08 DIAGNOSIS — R69 Illness, unspecified: Secondary | ICD-10-CM | POA: Diagnosis not present

## 2017-07-08 DIAGNOSIS — E1142 Type 2 diabetes mellitus with diabetic polyneuropathy: Secondary | ICD-10-CM | POA: Diagnosis not present

## 2017-07-08 DIAGNOSIS — N528 Other male erectile dysfunction: Secondary | ICD-10-CM | POA: Diagnosis not present

## 2017-07-08 DIAGNOSIS — E291 Testicular hypofunction: Secondary | ICD-10-CM | POA: Diagnosis not present

## 2017-07-08 DIAGNOSIS — I10 Essential (primary) hypertension: Secondary | ICD-10-CM | POA: Diagnosis not present

## 2017-08-29 DIAGNOSIS — I10 Essential (primary) hypertension: Secondary | ICD-10-CM | POA: Diagnosis not present

## 2017-08-29 DIAGNOSIS — R69 Illness, unspecified: Secondary | ICD-10-CM | POA: Diagnosis not present

## 2017-08-29 DIAGNOSIS — E782 Mixed hyperlipidemia: Secondary | ICD-10-CM | POA: Diagnosis not present

## 2017-08-29 DIAGNOSIS — N528 Other male erectile dysfunction: Secondary | ICD-10-CM | POA: Diagnosis not present

## 2017-08-29 DIAGNOSIS — E1142 Type 2 diabetes mellitus with diabetic polyneuropathy: Secondary | ICD-10-CM | POA: Diagnosis not present

## 2017-08-29 DIAGNOSIS — E291 Testicular hypofunction: Secondary | ICD-10-CM | POA: Diagnosis not present

## 2017-09-24 DIAGNOSIS — H40042 Steroid responder, left eye: Secondary | ICD-10-CM | POA: Diagnosis not present

## 2017-09-24 DIAGNOSIS — H401111 Primary open-angle glaucoma, right eye, mild stage: Secondary | ICD-10-CM | POA: Diagnosis not present

## 2017-09-24 DIAGNOSIS — H401122 Primary open-angle glaucoma, left eye, moderate stage: Secondary | ICD-10-CM | POA: Diagnosis not present

## 2017-12-11 DIAGNOSIS — R69 Illness, unspecified: Secondary | ICD-10-CM | POA: Diagnosis not present

## 2017-12-11 DIAGNOSIS — E1142 Type 2 diabetes mellitus with diabetic polyneuropathy: Secondary | ICD-10-CM | POA: Diagnosis not present

## 2017-12-11 DIAGNOSIS — E782 Mixed hyperlipidemia: Secondary | ICD-10-CM | POA: Diagnosis not present

## 2017-12-11 DIAGNOSIS — E291 Testicular hypofunction: Secondary | ICD-10-CM | POA: Diagnosis not present

## 2017-12-11 DIAGNOSIS — N528 Other male erectile dysfunction: Secondary | ICD-10-CM | POA: Diagnosis not present

## 2017-12-11 DIAGNOSIS — R10816 Epigastric abdominal tenderness: Secondary | ICD-10-CM | POA: Diagnosis not present

## 2017-12-11 DIAGNOSIS — I10 Essential (primary) hypertension: Secondary | ICD-10-CM | POA: Diagnosis not present

## 2018-01-12 ENCOUNTER — Emergency Department (HOSPITAL_BASED_OUTPATIENT_CLINIC_OR_DEPARTMENT_OTHER)
Admission: EM | Admit: 2018-01-12 | Discharge: 2018-01-12 | Disposition: A | Discharge status: Home / Self Care | Payer: Medicare HMO | Attending: Emergency Medicine | Admitting: Emergency Medicine

## 2018-01-12 ENCOUNTER — Other Ambulatory Visit: Payer: Self-pay

## 2018-01-12 ENCOUNTER — Emergency Department (HOSPITAL_BASED_OUTPATIENT_CLINIC_OR_DEPARTMENT_OTHER): Payer: Medicare HMO

## 2018-01-12 ENCOUNTER — Encounter (HOSPITAL_BASED_OUTPATIENT_CLINIC_OR_DEPARTMENT_OTHER): Payer: Self-pay | Admitting: *Deleted

## 2018-01-12 DIAGNOSIS — F17210 Nicotine dependence, cigarettes, uncomplicated: Secondary | ICD-10-CM | POA: Insufficient documentation

## 2018-01-12 DIAGNOSIS — K852 Alcohol induced acute pancreatitis without necrosis or infection: Secondary | ICD-10-CM | POA: Insufficient documentation

## 2018-01-12 DIAGNOSIS — R69 Illness, unspecified: Secondary | ICD-10-CM | POA: Diagnosis not present

## 2018-01-12 DIAGNOSIS — E119 Type 2 diabetes mellitus without complications: Secondary | ICD-10-CM | POA: Insufficient documentation

## 2018-01-12 DIAGNOSIS — Z7984 Long term (current) use of oral hypoglycemic drugs: Secondary | ICD-10-CM | POA: Diagnosis not present

## 2018-01-12 DIAGNOSIS — Z7982 Long term (current) use of aspirin: Secondary | ICD-10-CM | POA: Insufficient documentation

## 2018-01-12 DIAGNOSIS — I10 Essential (primary) hypertension: Secondary | ICD-10-CM | POA: Diagnosis not present

## 2018-01-12 DIAGNOSIS — R1013 Epigastric pain: Secondary | ICD-10-CM | POA: Diagnosis present

## 2018-01-12 DIAGNOSIS — E785 Hyperlipidemia, unspecified: Secondary | ICD-10-CM | POA: Insufficient documentation

## 2018-01-12 DIAGNOSIS — Z79899 Other long term (current) drug therapy: Secondary | ICD-10-CM | POA: Insufficient documentation

## 2018-01-12 HISTORY — DX: Essential (primary) hypertension: I10

## 2018-01-12 LAB — COMPREHENSIVE METABOLIC PANEL
ALT: 35 U/L (ref 0–44)
AST: 17 U/L (ref 15–41)
Albumin: 4 g/dL (ref 3.5–5.0)
Alkaline Phosphatase: 85 U/L (ref 38–126)
Anion gap: 11 (ref 5–15)
BUN: 23 mg/dL (ref 8–23)
CO2: 28 mmol/L (ref 22–32)
Calcium: 9.4 mg/dL (ref 8.9–10.3)
Chloride: 91 mmol/L — ABNORMAL LOW (ref 98–111)
Creatinine, Ser: 1.42 mg/dL — ABNORMAL HIGH (ref 0.61–1.24)
GFR calc Af Amer: 58 mL/min — ABNORMAL LOW (ref >60)
GFR calc non Af Amer: 50 mL/min — ABNORMAL LOW (ref >60)
Glucose, Bld: 429 mg/dL — ABNORMAL HIGH (ref 70–99)
Potassium: 4.6 mmol/L (ref 3.5–5.1)
Sodium: 130 mmol/L — ABNORMAL LOW (ref 135–145)
Total Bilirubin: 0.7 mg/dL (ref 0.3–1.2)
Total Protein: 7.4 g/dL (ref 6.5–8.1)

## 2018-01-12 LAB — URINALYSIS, ROUTINE W REFLEX MICROSCOPIC
Bilirubin Urine: NEGATIVE
Glucose, UA: >=500 mg/dL — AB
Hgb urine dipstick: NEGATIVE
Ketones, ur: NEGATIVE mg/dL
Leukocytes, UA: NEGATIVE
Nitrite: NEGATIVE
Protein, ur: NEGATIVE mg/dL
Specific Gravity, Urine: 1.01 (ref 1.005–1.030)
pH: 6 (ref 5.0–8.0)

## 2018-01-12 LAB — CBC WITH DIFFERENTIAL/PLATELET
Basophils Absolute: 0 10*3/uL (ref 0.0–0.1)
Basophils Relative: 0 %
Eosinophils Absolute: 0.1 10*3/uL (ref 0.0–0.7)
Eosinophils Relative: 1 %
HCT: 44 % (ref 39.0–52.0)
Hemoglobin: 15.6 g/dL (ref 13.0–17.0)
Lymphocytes Relative: 13 %
Lymphs Abs: 1.1 10*3/uL (ref 0.7–4.0)
MCH: 32.3 pg (ref 26.0–34.0)
MCHC: 35.5 g/dL (ref 30.0–36.0)
MCV: 91.1 fL (ref 78.0–100.0)
Monocytes Absolute: 0.7 10*3/uL (ref 0.1–1.0)
Monocytes Relative: 8 %
Neutro Abs: 6.4 10*3/uL (ref 1.7–7.7)
Neutrophils Relative %: 78 %
Platelets: 122 10*3/uL — ABNORMAL LOW (ref 150–400)
RBC: 4.83 MIL/uL (ref 4.22–5.81)
RDW: 10.9 % — ABNORMAL LOW (ref 11.5–15.5)
WBC: 8.3 10*3/uL (ref 4.0–10.5)

## 2018-01-12 LAB — URINALYSIS, MICROSCOPIC (REFLEX)

## 2018-01-12 LAB — LIPASE, BLOOD: Lipase: 151 U/L — ABNORMAL HIGH (ref 11–51)

## 2018-01-12 MED ORDER — MORPHINE SULFATE (PF) 4 MG/ML IV SOLN
4.0000 mg | Freq: Once | INTRAVENOUS | Status: DC
  Filled 2018-01-12: qty 1

## 2018-01-12 MED ORDER — ONDANSETRON HCL 4 MG PO TABS: 4.0000 mg | ORAL_TABLET | Freq: Four times a day (QID) | ORAL | 0 refills | Status: DC

## 2018-01-12 MED ORDER — OXYCODONE-ACETAMINOPHEN 5-325 MG PO TABS
2.0000 | ORAL_TABLET | Freq: Once | ORAL | Status: AC
  Administered 2018-01-12: 2 via ORAL
  Filled 2018-01-12: qty 2

## 2018-01-12 MED ORDER — SODIUM CHLORIDE 0.9 % IV BOLUS
1000.0000 mL | Freq: Once | INTRAVENOUS | Status: AC
Start: 2018-01-12 — End: 2018-01-12
  Administered 2018-01-12: 1000 mL via INTRAVENOUS

## 2018-01-12 MED ORDER — OXYCODONE-ACETAMINOPHEN 5-325 MG PO TABS
1.0000 | ORAL_TABLET | Freq: Four times a day (QID) | ORAL | 0 refills | Status: DC | PRN
Start: 2018-01-12 — End: 2019-09-03

## 2018-01-12 MED ORDER — CHLORDIAZEPOXIDE HCL 25 MG PO CAPS: ORAL_CAPSULE | ORAL | 0 refills | Status: DC

## 2018-01-12 MED ORDER — IOPAMIDOL (ISOVUE-300) INJECTION 61%
100.0000 mL | Freq: Once | INTRAVENOUS | Status: AC | PRN
  Administered 2018-01-12: 100 mL via INTRAVENOUS

## 2018-01-12 NOTE — Discharge Instructions (Addendum)
Abide by a clear liquid diet for at least 48 hours until you have no pain.  As tolerated, you can progress to a very bland diet.  Avoid alcohol.  Take Librium as prescribed to help prevent alcohol withdrawals.  Take Zofran every 6 hours as needed for nausea or vomiting.  Take Percocet every 6 hours as needed for your pain.  Please follow-up with your doctor before the end of the week for recheck.  Please return the emergency department if you develop any new or worsening symptoms including worsening pain, intractable vomiting, fevers, tremors, seizures, or any other concerning symptom.  Do not drink alcohol, drive, operate machinery or participate in any other potentially dangerous activities while taking opiate pain medication as it may make you sleepy. Do not take this medication with any other sedating medications, either prescription or over-the-counter. If you were prescribed Percocet or Vicodin, do not take these with acetaminophen (Tylenol) as it is already contained within these medications and overdose of Tylenol is dangerous.   This medication is an opiate (or narcotic) pain medication and can be habit forming.  Use it as little as possible to achieve adequate pain control.  Do not use or use it with extreme caution if you have a history of opiate abuse or dependence. This medication is intended for your use only - do not give any to anyone else and keep it in a secure place where nobody else, especially children, have access to it. It will also cause or worsen constipation, so you may want to consider taking an over-the-counter stool softener while you are taking this medication.

## 2018-01-12 NOTE — ED Provider Notes (Signed)
[facility] EMERGENCY DEPARTMENT Provider Note   CSN: [id number] Arrival date & time: 01/12/18  1459     History   Chief Complaint Chief Complaint  Patient presents with  . Abdominal Pain    HPI Nathaniel Nicholson is a 67 y.o. male with history of hypertension, GERD, diabetes who presents with a 42-month history of intermittent abdominal pain.  Patient describes the pain as sharp.  He has pain mostly in his epigastrium, however it radiates to his periumbilical and left and right occasionally.  He also reports occasional pain in his low back.  He also reports some heart burn in his chest occasionally.  He has been taking Gas-X, Maalox, Pepto-Bismol, Nexium, and Zantac without significant relief.  He denies any current chest pain or shortness of breath.  He has had some associated nausea, but no vomiting.  He denies any diarrhea or bloody stools.  He denies any fevers.  HPI  Past Medical History:  Diagnosis Date  . Diabetes mellitus without complication (HCC)   . GERD (gastroesophageal reflux disease)   . Hyperlipemia   . Hypertension     There are no active problems to display for this patient.   History reviewed. No pertinent surgical history.      Home Medications    Prior to Admission medications   Medication Sig Start Date End Date Taking? Authorizing Provider  aspirin 81 MG tablet Take 81 mg by mouth daily.    [provider]  chlordiazePOXIDE (LIBRIUM) 25 MG capsule 50mg  PO TID x 1D, then 25-50mg  PO BID X 1D, then 25-50mg  PO QD X 1D 01/12/18   Novah Goza M, PA-C  esomeprazole (NEXIUM) 40 MG capsule Take 40 mg by mouth daily at 12 noon.    [provider]  hydrOXYzine (ATARAX/VISTARIL) 25 MG tablet Take 1 tablet (25 mg total) by mouth every 6 (six) hours. 01/07/17   Jacalyn Lefevre, MD  ibuprofen (ADVIL,MOTRIN) 800 MG tablet Take 1 tablet (800 mg total) by mouth 3 (three) times daily. 08/06/12   Susy Frizzle, MD  metFORMIN (GLUCOPHAGE) 1000  MG tablet Take 1,000 mg by mouth 2 (two) times daily with a meal.    [provider]  ondansetron (ZOFRAN) 4 MG tablet Take 1 tablet (4 mg total) by mouth every 6 (six) hours. 01/12/18   Dijon Cosens, Waylan Boga, PA-C  oxyCODONE-acetaminophen (PERCOCET/ROXICET) 5-325 MG tablet Take 1-2 tablets by mouth every 6 (six) hours as needed for severe pain. 01/12/18   Apphia Cropley, Waylan Boga, PA-C  predniSONE (STERAPRED UNI-PAK 21 TAB) 10 MG (21) TBPK tablet Take 6 tabs by mouth daily  for 2 days, then 5 tabs for 2 days, then 4 tabs for 2 days, then 3 tabs for 2 days, 2 tabs for 2 days, then 1 tab by mouth daily for 2 days 01/07/17   Jacalyn Lefevre, MD    Family History History reviewed. No pertinent family history.  Social History Social History   Tobacco Use  . Smoking status: Current Every Day Smoker    Packs/day: 1.00    Years: 35.00    Pack years: 35.00    Types: Cigarettes  . Smokeless tobacco: Never Used  Substance Use Topics  . Alcohol use: Yes    Alcohol/week: 6.0 standard drinks    Types: 6 Cans of beer per week    Comment: daily  . Drug use: No     Allergies   Patient has no known allergies.   Review of Systems Review of Systems  Constitutional: Negative for chills and fever.  HENT: Negative for facial swelling and sore throat.   Respiratory: Negative for shortness of breath.   Cardiovascular: Negative for chest pain.  Gastrointestinal: Positive for abdominal pain and nausea. Negative for blood in stool, constipation, diarrhea and vomiting.  Genitourinary: Negative for dysuria.  Musculoskeletal: Negative for back pain.  Skin: Negative for rash and wound.  Neurological: Negative for headaches.  Psychiatric/Behavioral: The patient is not nervous/anxious.      Physical Exam Updated Vital Signs BP (!) 160/93 (BP Location: Left Arm)   Pulse 93   Temp 98.5 F (36.9 C) (Oral)   Resp 19   Ht 6\' 3"  (1.905 m)   Wt 98.9 kg   SpO2 98%   BMI 27.25 kg/m   Physical Exam    Constitutional: He appears well-developed and well-nourished. No distress.  HENT:  Head: Normocephalic and atraumatic.  Mouth/Throat: Oropharynx is clear and moist. No oropharyngeal exudate.  Eyes: Pupils are equal, round, and reactive to light. Conjunctivae are normal. Right eye exhibits no discharge. Left eye exhibits no discharge. No scleral icterus.  Neck: Normal range of motion. Neck supple. No thyromegaly present.  Cardiovascular: Normal rate, regular rhythm, normal heart sounds and intact distal pulses. Exam reveals no gallop and no friction rub.  No murmur heard. Pulmonary/Chest: Effort normal and breath sounds normal. No stridor. No respiratory distress. He has no wheezes. He has no rales.  Abdominal: Soft. Bowel sounds are normal. He exhibits no distension. There is tenderness in the epigastric area, left upper quadrant and left lower quadrant. There is no rebound, no guarding and no CVA tenderness.  Musculoskeletal: He exhibits no edema.  Lymphadenopathy:    He has no cervical adenopathy.  Neurological: He is alert. Coordination normal.  Skin: Skin is warm and dry. No rash noted. He is not diaphoretic. No pallor.  Psychiatric: He has a normal mood and affect.  Nursing note and vitals reviewed.    ED Treatments / Results  Labs (all labs ordered are listed, but only abnormal results are displayed) Labs Reviewed  URINALYSIS, ROUTINE W REFLEX MICROSCOPIC - Abnormal; Notable for the following components:      Result Value   Glucose, UA >=500 (*)    All other components within normal limits  URINALYSIS, MICROSCOPIC (REFLEX) - Abnormal; Notable for the following components:   Bacteria, UA RARE (*)    All other components within normal limits  COMPREHENSIVE METABOLIC PANEL - Abnormal; Notable for the following components:   Sodium 130 (*)    Chloride 91 (*)    Glucose, Bld 429 (*)    Creatinine, Ser 1.42 (*)    GFR calc non Af Amer 50 (*)    GFR calc Af Amer 58 (*)    All  other components within normal limits  LIPASE, BLOOD - Abnormal; Notable for the following components:   Lipase 151 (*)    All other components within normal limits  CBC WITH DIFFERENTIAL/PLATELET - Abnormal; Notable for the following components:   RDW 10.9 (*)    Platelets 122 (*)    All other components within normal limits    EKG None  Radiology Ct Abdomen Pelvis W Contrast  Result Date: 01/12/2018 CLINICAL DATA:  Lower abdominal pain EXAM: CT ABDOMEN AND PELVIS WITH CONTRAST TECHNIQUE: Multidetector CT imaging of the abdomen and pelvis was performed using the standard protocol following bolus administration of intravenous contrast. CONTRAST:  ISOVUE-300 IOPAMIDOL (ISOVUE-300) INJECTION 61% COMPARISON:  Report 04/04/2005 FINDINGS:  Lower chest: No acute abnormality. Hepatobiliary: No focal liver abnormality is seen. No gallstones, gallbladder wall thickening, or biliary dilatation. Pancreas: Edema at the tail of the pancreas with small amount of fluid and soft tissue stranding in the left anterior pararenal space. No ductal dilatation. No definite organized fluid collection. Spleen: Normal in size without focal abnormality. Adrenals/Urinary Tract: Adrenal glands are unremarkable. Kidneys are normal, without renal calculi, focal lesion, or hydronephrosis. Bladder is unremarkable. Stomach/Bowel: Stomach is within normal limits. Appendix appears normal. No evidence of bowel wall thickening, distention, or inflammatory changes. Diffuse diverticular disease of the colon without acute inflammatory change. Vascular/Lymphatic: Mild aortic atherosclerosis. No aneurysm. No significantly enlarged lymph nodes Reproductive: Slightly enlarged heterogeneous prostate Other: No free air.  Small fat in the left inguinal canal. Musculoskeletal: No acute or significant osseous findings. IMPRESSION: 1. Indistinct appearance of the tail of the pancreas with surrounding edema and small amount of fluid, suspicious  for acute pancreatitis. No definite organized fluid collection. No evidence for necrosis. 2. Slightly enlarged prostate 3. 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 Electronically Signed   By: Jasmine Pang M.D.   On: 01/12/2018 20:11    Procedures Procedures (including critical care time)  Medications Ordered in ED Medications  iopamidol (ISOVUE-300) 61 % injection 100 mL (100 mLs Intravenous Contrast Given 01/12/18 1934)  sodium chloride 0.9 % bolus 1,000 mL ( Intravenous Stopped 01/12/18 2142)  oxyCODONE-acetaminophen (PERCOCET/ROXICET) 5-325 MG per tablet 2 tablet (2 tablets Oral Given 01/12/18 2041)     Initial Impression / Assessment and Plan / ED Course  I have reviewed the triage vital signs and the nursing notes.  Pertinent labs & imaging results that were available during my care of the patient were reviewed by me and considered in my medical decision making (see chart for details).     Patient with pancreatitis.  He has mild dehydration.  Fluids given in the ED.  CBC is unremarkable except for mild thrombocytopenia.  Lipase is 151.  CT of abdomen pelvis shows indistinct appearance of the tail the pancreas with surrounding edema and small amount of fluid suspicious for acute pancreatitis, no organized fluid collection or necrosis; slightly enlarged prostate; diverticular disease without acute inflammatory change.  Patient's pain controlled in the ED with Percocet.  Patient is tolerating oral fluids.  Will discharge home with pain control as well as Zofran and Librium, as patient reports drinking 6 beers daily.  I advised him to try to stop drinking as this was most likely the cause of his pancreatitis.  He has no history of alcohol withdrawal symptoms and no seizure history.  He reports he has stopped in the past without issue.  Clear liquid diet with progression as tolerated.  I reviewed the [place] narcotic database and found no discrepancies.  Follow-up to PCP in  2 days for recheck.  Strict return precautions given.  Patient understands and agrees with plan.  Patient vitals stable throughout ED course and discharged in satisfactory condition. I discussed patient case with Dr. Juleen China who guided the patient's management and agrees with plan.   Final Clinical Impressions(s) / ED Diagnoses   Final diagnoses:  Alcohol-induced acute pancreatitis without infection or necrosis    ED Discharge Orders         Ordered    chlordiazePOXIDE (LIBRIUM) 25 MG capsule     01/12/18 2146    oxyCODONE-acetaminophen (PERCOCET/ROXICET) 5-325 MG tablet  Every 6 hours PRN     01/12/18 2147  ondansetron (ZOFRAN) 4 MG tablet  Every 6 hours     01/12/18 2147           Emi Holes, PA-C 01/12/18 2350    Raeford Razor, MD 01/21/18 [phone]

## 2018-01-12 NOTE — ED Triage Notes (Signed)
Pt c/o abd pain x 3 months , seen by PMD for same Dx GERD

## 2018-01-13 DIAGNOSIS — R69 Illness, unspecified: Secondary | ICD-10-CM | POA: Diagnosis not present

## 2018-03-10 DIAGNOSIS — E119 Type 2 diabetes mellitus without complications: Secondary | ICD-10-CM | POA: Diagnosis not present

## 2018-03-10 DIAGNOSIS — Z961 Presence of intraocular lens: Secondary | ICD-10-CM | POA: Diagnosis not present

## 2018-03-10 DIAGNOSIS — H25811 Combined forms of age-related cataract, right eye: Secondary | ICD-10-CM | POA: Diagnosis not present

## 2018-03-10 DIAGNOSIS — H40023 Open angle with borderline findings, high risk, bilateral: Secondary | ICD-10-CM | POA: Diagnosis not present

## 2018-03-19 DIAGNOSIS — N528 Other male erectile dysfunction: Secondary | ICD-10-CM | POA: Diagnosis not present

## 2018-03-19 DIAGNOSIS — E1142 Type 2 diabetes mellitus with diabetic polyneuropathy: Secondary | ICD-10-CM | POA: Diagnosis not present

## 2018-03-19 DIAGNOSIS — E782 Mixed hyperlipidemia: Secondary | ICD-10-CM | POA: Diagnosis not present

## 2018-03-19 DIAGNOSIS — Z23 Encounter for immunization: Secondary | ICD-10-CM | POA: Diagnosis not present

## 2018-03-19 DIAGNOSIS — I10 Essential (primary) hypertension: Secondary | ICD-10-CM | POA: Diagnosis not present

## 2018-03-19 DIAGNOSIS — N401 Enlarged prostate with lower urinary tract symptoms: Secondary | ICD-10-CM | POA: Diagnosis not present

## 2018-03-19 DIAGNOSIS — R69 Illness, unspecified: Secondary | ICD-10-CM | POA: Diagnosis not present

## 2018-03-19 DIAGNOSIS — Z1211 Encounter for screening for malignant neoplasm of colon: Secondary | ICD-10-CM | POA: Diagnosis not present

## 2018-03-19 DIAGNOSIS — E291 Testicular hypofunction: Secondary | ICD-10-CM | POA: Diagnosis not present

## 2018-04-13 DIAGNOSIS — Z0000 Encounter for general adult medical examination without abnormal findings: Secondary | ICD-10-CM | POA: Diagnosis not present

## 2018-04-13 DIAGNOSIS — Z6825 Body mass index (BMI) 25.0-25.9, adult: Secondary | ICD-10-CM | POA: Diagnosis not present

## 2018-04-13 DIAGNOSIS — Z122 Encounter for screening for malignant neoplasm of respiratory organs: Secondary | ICD-10-CM | POA: Diagnosis not present

## 2018-07-02 DIAGNOSIS — E782 Mixed hyperlipidemia: Secondary | ICD-10-CM | POA: Diagnosis not present

## 2018-07-02 DIAGNOSIS — E1142 Type 2 diabetes mellitus with diabetic polyneuropathy: Secondary | ICD-10-CM | POA: Diagnosis not present

## 2018-07-02 DIAGNOSIS — R69 Illness, unspecified: Secondary | ICD-10-CM | POA: Diagnosis not present

## 2018-07-02 DIAGNOSIS — I10 Essential (primary) hypertension: Secondary | ICD-10-CM | POA: Diagnosis not present

## 2018-07-02 DIAGNOSIS — E291 Testicular hypofunction: Secondary | ICD-10-CM | POA: Diagnosis not present

## 2018-07-02 DIAGNOSIS — E559 Vitamin D deficiency, unspecified: Secondary | ICD-10-CM | POA: Diagnosis not present

## 2018-07-02 DIAGNOSIS — N528 Other male erectile dysfunction: Secondary | ICD-10-CM | POA: Diagnosis not present

## 2018-09-28 DIAGNOSIS — H40023 Open angle with borderline findings, high risk, bilateral: Secondary | ICD-10-CM | POA: Diagnosis not present

## 2018-10-06 DIAGNOSIS — I10 Essential (primary) hypertension: Secondary | ICD-10-CM | POA: Diagnosis not present

## 2018-10-06 DIAGNOSIS — E782 Mixed hyperlipidemia: Secondary | ICD-10-CM | POA: Diagnosis not present

## 2018-10-06 DIAGNOSIS — E1142 Type 2 diabetes mellitus with diabetic polyneuropathy: Secondary | ICD-10-CM | POA: Diagnosis not present

## 2018-10-06 DIAGNOSIS — E559 Vitamin D deficiency, unspecified: Secondary | ICD-10-CM | POA: Diagnosis not present

## 2018-10-06 DIAGNOSIS — E291 Testicular hypofunction: Secondary | ICD-10-CM | POA: Diagnosis not present

## 2018-10-06 DIAGNOSIS — R69 Illness, unspecified: Secondary | ICD-10-CM | POA: Diagnosis not present

## 2018-10-06 DIAGNOSIS — N528 Other male erectile dysfunction: Secondary | ICD-10-CM | POA: Diagnosis not present

## 2018-10-22 DIAGNOSIS — I10 Essential (primary) hypertension: Secondary | ICD-10-CM | POA: Diagnosis not present

## 2018-10-22 DIAGNOSIS — E559 Vitamin D deficiency, unspecified: Secondary | ICD-10-CM | POA: Diagnosis not present

## 2018-10-22 DIAGNOSIS — E1142 Type 2 diabetes mellitus with diabetic polyneuropathy: Secondary | ICD-10-CM | POA: Diagnosis not present

## 2018-10-22 DIAGNOSIS — E782 Mixed hyperlipidemia: Secondary | ICD-10-CM | POA: Diagnosis not present

## 2019-01-20 DIAGNOSIS — Z122 Encounter for screening for malignant neoplasm of respiratory organs: Secondary | ICD-10-CM | POA: Diagnosis not present

## 2019-01-20 DIAGNOSIS — E1142 Type 2 diabetes mellitus with diabetic polyneuropathy: Secondary | ICD-10-CM | POA: Diagnosis not present

## 2019-01-20 DIAGNOSIS — I10 Essential (primary) hypertension: Secondary | ICD-10-CM | POA: Diagnosis not present

## 2019-01-20 DIAGNOSIS — E782 Mixed hyperlipidemia: Secondary | ICD-10-CM | POA: Diagnosis not present

## 2019-01-20 DIAGNOSIS — Z23 Encounter for immunization: Secondary | ICD-10-CM | POA: Diagnosis not present

## 2019-01-20 DIAGNOSIS — R69 Illness, unspecified: Secondary | ICD-10-CM | POA: Diagnosis not present

## 2019-01-20 DIAGNOSIS — B351 Tinea unguium: Secondary | ICD-10-CM | POA: Diagnosis not present

## 2019-01-20 DIAGNOSIS — N528 Other male erectile dysfunction: Secondary | ICD-10-CM | POA: Diagnosis not present

## 2019-01-20 DIAGNOSIS — E291 Testicular hypofunction: Secondary | ICD-10-CM | POA: Diagnosis not present

## 2019-03-19 DIAGNOSIS — H40013 Open angle with borderline findings, low risk, bilateral: Secondary | ICD-10-CM | POA: Diagnosis not present

## 2019-03-19 DIAGNOSIS — Z961 Presence of intraocular lens: Secondary | ICD-10-CM | POA: Diagnosis not present

## 2019-03-19 DIAGNOSIS — E119 Type 2 diabetes mellitus without complications: Secondary | ICD-10-CM | POA: Diagnosis not present

## 2019-03-19 DIAGNOSIS — H25811 Combined forms of age-related cataract, right eye: Secondary | ICD-10-CM | POA: Diagnosis not present

## 2019-04-23 DIAGNOSIS — M79675 Pain in left toe(s): Secondary | ICD-10-CM | POA: Diagnosis not present

## 2019-04-30 DIAGNOSIS — I10 Essential (primary) hypertension: Secondary | ICD-10-CM | POA: Diagnosis not present

## 2019-04-30 DIAGNOSIS — N528 Other male erectile dysfunction: Secondary | ICD-10-CM | POA: Diagnosis not present

## 2019-04-30 DIAGNOSIS — E291 Testicular hypofunction: Secondary | ICD-10-CM | POA: Diagnosis not present

## 2019-04-30 DIAGNOSIS — E782 Mixed hyperlipidemia: Secondary | ICD-10-CM | POA: Diagnosis not present

## 2019-04-30 DIAGNOSIS — E1142 Type 2 diabetes mellitus with diabetic polyneuropathy: Secondary | ICD-10-CM | POA: Diagnosis not present

## 2019-05-03 DIAGNOSIS — E782 Mixed hyperlipidemia: Secondary | ICD-10-CM | POA: Diagnosis not present

## 2019-05-03 DIAGNOSIS — E559 Vitamin D deficiency, unspecified: Secondary | ICD-10-CM | POA: Diagnosis not present

## 2019-05-03 DIAGNOSIS — E1142 Type 2 diabetes mellitus with diabetic polyneuropathy: Secondary | ICD-10-CM | POA: Diagnosis not present

## 2019-06-14 ENCOUNTER — Other Ambulatory Visit: Payer: Self-pay | Admitting: Family Medicine

## 2019-06-14 MED ORDER — METFORMIN HCL 1000 MG PO TABS: 1000.0000 mg | ORAL_TABLET | Freq: Two times a day (BID) | ORAL | 1 refills | Status: DC

## 2019-06-15 ENCOUNTER — Other Ambulatory Visit: Payer: Self-pay | Admitting: Family Medicine

## 2019-06-15 MED ORDER — METFORMIN HCL 1000 MG PO TABS: 1000.0000 mg | ORAL_TABLET | Freq: Two times a day (BID) | ORAL | 1 refills | Status: DC

## 2019-06-22 ENCOUNTER — Other Ambulatory Visit: Payer: Self-pay | Admitting: Family Medicine

## 2019-06-22 MED ORDER — GABAPENTIN 300 MG PO CAPS: 300.0000 mg | ORAL_CAPSULE | Freq: Two times a day (BID) | ORAL | 1 refills | Status: DC

## 2019-07-13 ENCOUNTER — Other Ambulatory Visit: Payer: Self-pay

## 2019-07-13 MED ORDER — LOSARTAN POTASSIUM 50 MG PO TABS: 50.0000 mg | ORAL_TABLET | Freq: Every day | ORAL | 0 refills | Status: DC

## 2019-07-14 ENCOUNTER — Other Ambulatory Visit: Payer: Self-pay

## 2019-07-14 MED ORDER — LOSARTAN POTASSIUM 50 MG PO TABS: 50.0000 mg | ORAL_TABLET | Freq: Every day | ORAL | 0 refills | Status: DC

## 2019-07-19 ENCOUNTER — Other Ambulatory Visit: Payer: Self-pay | Admitting: Family Medicine

## 2019-07-19 MED ORDER — LOSARTAN POTASSIUM 50 MG PO TABS: 50.0000 mg | ORAL_TABLET | Freq: Every day | ORAL | 0 refills | Status: DC

## 2019-08-02 ENCOUNTER — Other Ambulatory Visit: Payer: Self-pay | Admitting: Family Medicine

## 2019-08-02 MED ORDER — TADALAFIL 5 MG PO TABS: 5.0000 mg | ORAL_TABLET | Freq: Every day | ORAL | 5 refills | Status: DC

## 2019-08-31 NOTE — Progress Notes (Signed)
Subjective:  Patient ID: Nathaniel Nicholson, male    DOB: 09-Jan-1951  Age: 69 y.o. MRN: [id number]  Chief Complaint  Patient presents with  . Diabetes  . Hypertension  . Hyperlipidemia  . Gastroesophageal Reflux    HPI Patient is a 69 year old male who presents for follow-up of his diabetes, hyperlipidemia, hypertension, and GERD.  Patient reports he is eating healthy.  He is very active at work.  He is compliant with taking his medications.  He is concerned he is having a side effect erectile dysfunction related to the losartan but he is unwilling to stop it because he realizes he needs it for his blood pressure.  Sugars are running in the 130s.  Patient checks his feet daily.  Patient has seen the eye doctor in the last 12 months.  He is currently taking Metformin for his diabetes.  He takes losartan for his blood pressure and for diabetic nephropathy.  He takes gabapentin for his diabetic neuropathy.  He is on Nexium for his reflux. Derian reports left knee pain intermittently.  This responds well to colchicine.  He has to take this approximately once a week.  He has not currently on anything long-term for his gout.  He is questioning whether or not we can increase his Cialis to 10 mg once daily to help with his BPH and erectile dysfunction.  Past Medical History:  Diagnosis Date  . Diabetes mellitus without complication ([place])   . GERD (gastroesophageal reflux disease)   . Hyperlipemia   . Hypertension   . Nicotine dependence, cigarettes, uncomplicated   . Other male erectile dysfunction   . Testicular hypofunction   . Vitamin D deficiency    History reviewed. No pertinent surgical history.  Family History  Problem Relation Age of Onset  . Cancer Father        lung  . Cancer Sister        lung  . Cancer Brother        throat  . Hypertension Other   . Diabetes Other    Social History   Socioeconomic History  . Marital status: Married    Spouse name: Not on file  . Number of  children: Not on file  . Years of education: Not on file  . Highest education level: Not on file  Occupational History  . Occupation: Nurse, learning disability  Tobacco Use  . Smoking status: Current Every Day Smoker    Packs/day: 0.50    Years: 35.00    Pack years: 17.50    Types: Cigarettes  . Smokeless tobacco: Never Used  . Tobacco comment: 1 ppd for > 30 years.  Substance and Sexual Activity  . Alcohol use: Yes    Alcohol/week: 6.0 standard drinks    Types: 6 Cans of beer per week    Comment: daily  . Drug use: No  . Sexual activity: Not on file  Other Topics Concern  . Not on file  Social History Narrative  . Not on file   Social Determinants of Health   Financial Resource Strain:   . Difficulty of Paying Living Expenses:   Food Insecurity:   . Worried About Charity fundraiser in the Last Year:   . Arboriculturist in the Last Year:   Transportation Needs:   . Film/video editor (Medical):   Marland Kitchen Lack of Transportation (Non-Medical):   Physical Activity:   . Days of Exercise per Week:   . Minutes of Exercise per  Session:   Stress:   . Feeling of Stress :   Social Connections:   . Frequency of Communication with Friends and Family:   . Frequency of Social Gatherings with Friends and Family:   . Attends Religious Services:   . Active Member of Clubs or Organizations:   . Attends Banker Meetings:   Marland Kitchen Marital Status:     Review of Systems  Constitutional: Negative for chills, diaphoresis, fatigue and fever.  HENT: Negative for congestion, ear pain and sore throat.   Respiratory: Negative for cough and shortness of breath.   Cardiovascular: Negative for chest pain, palpitations and leg swelling.  Gastrointestinal: Negative for abdominal pain, constipation, diarrhea, nausea and vomiting.  Endocrine: Negative for polydipsia, polyphagia and polyuria.  Genitourinary: Negative for dysuria, frequency and urgency.  Musculoskeletal: Positive for arthralgias  (left knee pain - colchicine helps. ). Negative for myalgias.  Neurological: Negative for dizziness and headaches.  Psychiatric/Behavioral: Negative for dysphoric mood.     Objective:  BP 132/86   Pulse (!) 116   Temp (!) 97.2 F (36.2 C)   Resp 18   Ht 6\' 3"  (1.905 m)   Wt 214 lb 12.8 oz (97.4 kg)   BMI 26.85 kg/m   BP/Weight 09/03/2019 01/12/2018 01/07/2017  Systolic BP 132 160 141  Diastolic BP 86 93 94  Wt. (Lbs) 214.8 218 205  BMI 26.85 27.25 25.62    Physical Exam Vitals reviewed.  Constitutional:      Appearance: Normal appearance. He is normal weight.  Cardiovascular:     Rate and Rhythm: Normal rate and regular rhythm.  Pulmonary:     Effort: Pulmonary effort is normal.     Breath sounds: Normal breath sounds.  Abdominal:     General: Abdomen is flat. Bowel sounds are normal.     Palpations: Abdomen is soft.  Musculoskeletal:        General: No tenderness.  Neurological:     Mental Status: He is alert and oriented to person, place, and time.  Psychiatric:        Mood and Affect: Mood normal.        Behavior: Behavior normal.     Diabetic Foot Exam - Simple   Simple Foot Form Diabetic Foot exam was performed with the following findings: Yes 09/03/2019  8:17 AM  Visual Inspection See comments: Yes Sensation Testing See comments: Yes Pulse Check Posterior Tibialis and Dorsalis pulse intact bilaterally: Yes Comments Patient has decreased sensation on feet. He has thickened toe nails       Assessment & Plan:   1. Diabetes mellitus without complication (HCC) Control: good Recommend check sugars fasting daily. Recommend check feet daily. Recommend annual eye exams. Medicines: no changes Continue to work on eating a healthy diet and exercise.  Labs drawn today.  - Hemoglobin A1c - CBC with Differential/Platelet - POCT UA - Microalbumin: normal  2. Mixed hyperlipidemia Well controlled.  No changes to medicines.  Continue to work on eating a  healthy diet and exercise.  Labs drawn today.  - Lipid panel  3. Essential hypertension Well controlled.  No changes to medicines.  Continue to work on eating a healthy diet and exercise.  Labs drawn today.  - Comprehensive metabolic panel  4. Encounter for screening for lung cancer  Armany has decreased his smoking to half pack per day.  He did smoke at least a pack a day for 30 years however so needs to have his CT scan for  lung cancer screening. - CT CHEST LUNG CA SCREEN LOW DOSE W/O CM; Future  5. Idiopathic chronic gout of right knee without tophus Start on allopurinol 100 mg once daily.  Reduce colchicine for flareups still. - allopurinol (ZYLOPRIM) 100 MG tablet; Take 1 tablet (100 mg total) by mouth daily.  Dispense: 90 tablet; Refill: 0    Meds ordered this encounter  Medications  . allopurinol (ZYLOPRIM) 100 MG tablet    Sig: Take 1 tablet (100 mg total) by mouth daily.    Dispense:  90 tablet    Refill:  0  . tadalafil (CIALIS) 10 MG tablet    Sig: Take 1 tablet (10 mg total) by mouth daily.    Dispense:  30 tablet    Refill:  3    Orders Placed This Encounter  Procedures  . CT CHEST LUNG CA SCREEN LOW DOSE W/O CM  . Lipid panel  . Hemoglobin A1c  . Comprehensive metabolic panel  . CBC with Differential/Platelet  . POCT UA - Microalbumin     Follow-up: Return in about 4 months (around 01/04/2020).  An After Visit Summary was printed and given to the patient.  Blane Ohara [facility] [phone]

## 2019-09-01 ENCOUNTER — Encounter: Payer: Self-pay | Admitting: Family Medicine

## 2019-09-01 DIAGNOSIS — I152 Hypertension secondary to endocrine disorders: Secondary | ICD-10-CM | POA: Insufficient documentation

## 2019-09-01 DIAGNOSIS — E1159 Type 2 diabetes mellitus with other circulatory complications: Secondary | ICD-10-CM | POA: Insufficient documentation

## 2019-09-01 DIAGNOSIS — E785 Hyperlipidemia, unspecified: Secondary | ICD-10-CM | POA: Insufficient documentation

## 2019-09-01 DIAGNOSIS — E119 Type 2 diabetes mellitus without complications: Secondary | ICD-10-CM | POA: Insufficient documentation

## 2019-09-03 ENCOUNTER — Encounter: Payer: Self-pay | Admitting: Family Medicine

## 2019-09-03 ENCOUNTER — Ambulatory Visit (INDEPENDENT_AMBULATORY_CARE_PROVIDER_SITE_OTHER): Payer: Medicare HMO | Admitting: Family Medicine

## 2019-09-03 ENCOUNTER — Other Ambulatory Visit: Payer: Self-pay

## 2019-09-03 VITALS — BP 132/86 | HR 116 | Temp 97.2°F | Resp 18 | Ht 75.0 in | Wt 214.8 lb

## 2019-09-03 DIAGNOSIS — N401 Enlarged prostate with lower urinary tract symptoms: Secondary | ICD-10-CM

## 2019-09-03 DIAGNOSIS — I10 Essential (primary) hypertension: Secondary | ICD-10-CM | POA: Diagnosis not present

## 2019-09-03 DIAGNOSIS — E298 Other testicular dysfunction: Secondary | ICD-10-CM | POA: Diagnosis not present

## 2019-09-03 DIAGNOSIS — M1A0610 Idiopathic chronic gout, right knee, without tophus (tophi): Secondary | ICD-10-CM | POA: Diagnosis not present

## 2019-09-03 DIAGNOSIS — R351 Nocturia: Secondary | ICD-10-CM | POA: Diagnosis not present

## 2019-09-03 DIAGNOSIS — Z122 Encounter for screening for malignant neoplasm of respiratory organs: Secondary | ICD-10-CM

## 2019-09-03 DIAGNOSIS — E782 Mixed hyperlipidemia: Secondary | ICD-10-CM | POA: Diagnosis not present

## 2019-09-03 DIAGNOSIS — E119 Type 2 diabetes mellitus without complications: Secondary | ICD-10-CM | POA: Diagnosis not present

## 2019-09-03 LAB — POCT UA - MICROALBUMIN: Microalbumin Ur, POC: 30 mg/L

## 2019-09-03 MED ORDER — TADALAFIL 10 MG PO TABS: 10.0000 mg | ORAL_TABLET | Freq: Every day | ORAL | 3 refills | Status: DC

## 2019-09-03 MED ORDER — ALLOPURINOL 100 MG PO TABS: 100.0000 mg | ORAL_TABLET | Freq: Every day | ORAL | 0 refills | Status: DC

## 2019-09-04 LAB — CBC WITH DIFFERENTIAL/PLATELET
Basophils Absolute: 0 10*3/uL (ref 0.0–0.2)
Basos: 0 %
EOS (ABSOLUTE): 0.2 10*3/uL (ref 0.0–0.4)
Eos: 5 %
Hematocrit: 41.9 % (ref 37.5–51.0)
Hemoglobin: 14.3 g/dL (ref 13.0–17.7)
Immature Grans (Abs): 0 10*3/uL (ref 0.0–0.1)
Immature Granulocytes: 0 %
Lymphocytes Absolute: 1.5 10*3/uL (ref 0.7–3.1)
Lymphs: 43 %
MCH: 33.6 pg — ABNORMAL HIGH (ref 26.6–33.0)
MCHC: 34.1 g/dL (ref 31.5–35.7)
MCV: 98 fL — ABNORMAL HIGH (ref 79–97)
Monocytes Absolute: 0.5 10*3/uL (ref 0.1–0.9)
Monocytes: 13 %
Neutrophils Absolute: 1.4 10*3/uL (ref 1.4–7.0)
Neutrophils: 39 %
Platelets: 184 10*3/uL (ref 150–450)
RBC: 4.26 x10E6/uL (ref 4.14–5.80)
RDW: 11.8 % (ref 11.6–15.4)
WBC: 3.6 10*3/uL (ref 3.4–10.8)

## 2019-09-04 LAB — LIPID PANEL
Chol/HDL Ratio: 2.5 ratio (ref 0.0–5.0)
Cholesterol, Total: 136 mg/dL (ref 100–199)
HDL: 55 mg/dL (ref >39)
LDL Chol Calc (NIH): 53 mg/dL (ref 0–99)
Triglycerides: 169 mg/dL — ABNORMAL HIGH (ref 0–149)
VLDL Cholesterol Cal: 28 mg/dL (ref 5–40)

## 2019-09-04 LAB — COMPREHENSIVE METABOLIC PANEL
ALT: 32 IU/L (ref 0–44)
AST: 22 IU/L (ref 0–40)
Albumin/Globulin Ratio: 2 (ref 1.2–2.2)
Albumin: 4.8 g/dL (ref 3.8–4.8)
Alkaline Phosphatase: 80 IU/L (ref 48–121)
BUN/Creatinine Ratio: 13 (ref 10–24)
BUN: 16 mg/dL (ref 8–27)
Bilirubin Total: 0.3 mg/dL (ref 0.0–1.2)
CO2: 22 mmol/L (ref 20–29)
Calcium: 10 mg/dL (ref 8.6–10.2)
Chloride: 104 mmol/L (ref 96–106)
Creatinine, Ser: 1.21 mg/dL (ref 0.76–1.27)
GFR calc Af Amer: 71 mL/min/{1.73_m2} (ref >59)
GFR calc non Af Amer: 62 mL/min/{1.73_m2} (ref >59)
Globulin, Total: 2.4 g/dL (ref 1.5–4.5)
Glucose: 148 mg/dL — ABNORMAL HIGH (ref 65–99)
Potassium: 5 mmol/L (ref 3.5–5.2)
Sodium: 141 mmol/L (ref 134–144)
Total Protein: 7.2 g/dL (ref 6.0–8.5)

## 2019-09-04 LAB — CARDIOVASCULAR RISK ASSESSMENT

## 2019-09-04 LAB — HEMOGLOBIN A1C
Est. average glucose Bld gHb Est-mCnc: 169 mg/dL
Hgb A1c MFr Bld: 7.5 % — ABNORMAL HIGH (ref 4.8–5.6)

## 2019-09-09 ENCOUNTER — Ambulatory Visit (INDEPENDENT_AMBULATORY_CARE_PROVIDER_SITE_OTHER): Payer: Medicare HMO

## 2019-09-09 ENCOUNTER — Other Ambulatory Visit: Payer: Self-pay

## 2019-09-09 VITALS — BP 132/72 | HR 74 | Temp 98.2°F | Ht 75.0 in | Wt 217.0 lb

## 2019-09-09 DIAGNOSIS — F17219 Nicotine dependence, cigarettes, with unspecified nicotine-induced disorders: Secondary | ICD-10-CM

## 2019-09-09 DIAGNOSIS — Z0000 Encounter for general adult medical examination without abnormal findings: Secondary | ICD-10-CM

## 2019-09-09 DIAGNOSIS — R69 Illness, unspecified: Secondary | ICD-10-CM | POA: Diagnosis not present

## 2019-09-09 NOTE — Progress Notes (Signed)
Subjective:   Nathaniel Nicholson is a 69 y.o. male who presents for Medicare Annual/Subsequent preventive examination.  This wellness visit is conducted by a nurse.  The patient's medications were reviewed and reconciled since the patient's last visit.  History details were provided by the patient and his wife.  The history appears to be reliable.    Patient's last AWV was >1 year ago.   Medical History: Patient history and Family history was reviewed  Medications, Allergies, and preventative health maintenance was reviewed and updated.  Review of Systems:  Review of Systems  Constitutional: Negative.   HENT: Negative.   Eyes: Negative.   Respiratory: Negative.  Negative for chest tightness and shortness of breath.   Cardiovascular: Negative.  Negative for chest pain and palpitations.  Gastrointestinal: Negative.   Musculoskeletal: Negative.  Negative for back pain and neck pain.  Neurological: Negative.  Negative for dizziness and headaches.  Psychiatric/Behavioral: Negative.  Negative for agitation, confusion and suicidal ideas. The patient is not nervous/anxious.    Cardiac Risk Factors include: advanced age (>2men, >80 women);diabetes mellitus;male gender;smoking/ tobacco exposure     Objective:    Vitals: BP 132/72 (BP Location: Left Arm, Patient Position: Sitting, Cuff Size: Large)   Pulse 74   Temp 98.2 F (36.8 C) (Temporal)   Ht 6\' 3"  (1.905 m)   Wt 217 lb (98.4 kg)   BMI 27.12 kg/m   Body mass index is 27.12 kg/m.  Advanced Directives 09/09/2019 01/12/2018 01/07/2017 08/06/2016  Does Patient Have a Medical Advance Directive? No No No No  Would patient like information on creating a medical advance directive? Yes (MAU/Ambulatory/Procedural Areas - Information given) - - -    Tobacco Social History   Tobacco Use  Smoking Status Current Every Day Smoker  . Packs/day: 0.50  . Years: 35.00  . Pack years: 17.50  . Types: Cigarettes  Smokeless Tobacco Never Used   Tobacco Comment   1 ppd for > 30 years.     Ready to quit: No Counseling given: Yes Comment: 1 ppd for > 30 years.   Clinical Intake:  Pre-visit preparation completed: Yes  Pain : No/denies pain Pain Score: 0-No pain     BMI - recorded: 27.12 Nutritional Status: BMI 25 -29 Overweight Nutritional Risks: None Diabetes: Yes CBG done?: No Did pt. bring in CBG monitor from home?: No  How often do you need to have someone help you when you read instructions, pamphlets, or other written materials from your doctor or pharmacy?: 1 - Never  Interpreter Needed?: No     Past Medical History:  Diagnosis Date  . Diabetes mellitus without complication (HCC)   . GERD (gastroesophageal reflux disease)   . Hyperlipemia   . Hypertension   . Nicotine dependence, cigarettes, uncomplicated   . Other male erectile dysfunction   . Testicular hypofunction   . Vitamin D deficiency    History reviewed. No pertinent surgical history. Family History  Problem Relation Age of Onset  . Cancer Father        lung  . Cancer Sister        lung  . Cancer Brother        throat  . Hypertension Other   . Diabetes Other    Social History   Socioeconomic History  . Marital status: Married    Spouse name: Nathaniel Nicholson  . Number of children: 5  Occupational History  . Occupation: 10-08-2005  Tobacco Use  . Smoking status: Current Every  Day Smoker    Packs/day: 0.50    Years: 35.00    Pack years: 17.50    Types: Cigarettes  . Smokeless tobacco: Never Used  . Tobacco comment: 1 ppd for > 30 years.  Substance and Sexual Activity  . Alcohol use: Yes    Alcohol/week: 6.0 standard drinks    Types: 6 Cans of beer per week    Comment: daily  . Drug use: No    Outpatient Encounter Medications as of 09/09/2019  Medication Sig  . allopurinol (ZYLOPRIM) 100 MG tablet Take 1 tablet (100 mg total) by mouth daily.  . colchicine 0.6 MG tablet Take 0.6 mg by mouth daily.  Marland Kitchen esomeprazole  (NEXIUM) 40 MG capsule Take 40 mg by mouth daily at 12 noon.  . gabapentin (NEURONTIN) 300 MG capsule Take 1 capsule (300 mg total) by mouth 2 (two) times daily.  Marland Kitchen ibuprofen (ADVIL,MOTRIN) 800 MG tablet Take 1 tablet (800 mg total) by mouth 3 (three) times daily.  Marland Kitchen losartan (COZAAR) 50 MG tablet Take 1 tablet (50 mg total) by mouth daily.  . metFORMIN (GLUCOPHAGE) 1000 MG tablet Take 1 tablet (1,000 mg total) by mouth 2 (two) times daily with a meal.  . tadalafil (CIALIS) 10 MG tablet Take 1 tablet (10 mg total) by mouth daily.   No facility-administered encounter medications on file as of 09/09/2019.    Activities of Daily Living In your present state of health, do you have any difficulty performing the following activities: 09/09/2019  Hearing? N  Vision? N  Difficulty concentrating or making decisions? N  Walking or climbing stairs? N  Dressing or bathing? N  Doing errands, shopping? N  Preparing Food and eating ? N  Using the Toilet? N  In the past six months, have you accidently leaked urine? N  Do you have problems with loss of bowel control? N  Managing your Medications? N  Managing your Finances? N  Housekeeping or managing your Housekeeping? N  Some recent data might be hidden    Patient Care Team: Blane Ohara, MD as PCP - General (Family Medicine)   Assessment:   This is a routine wellness examination for Nathaniel Nicholson.  Exercise Activities and Dietary recommendations Current Exercise Habits: Home exercise routine, Type of exercise: walking, Time (Minutes): 15, Frequency (Times/Week): 5, Weekly Exercise (Minutes/Week): 75, Intensity: Mild, Exercise limited by: None identified  Goals    . Complete Cologuard    . HEMOGLOBIN A1C < 7    . Obtain Annual Eye (retinal)  Exam     . Get Td and Shingrix Vaccine at pharmacy    . Quit Smoking       Fall Risk Fall Risk  09/09/2019 09/03/2019  Falls in the past year? 0 0  Number falls in past yr: 0 0  Injury with Fall? 0 -  Risk  for fall due to : No Fall Risks -  Follow up Falls evaluation completed;Falls prevention discussed -   Is the patient's home free of loose throw rugs in walkways, pet beds, electrical cords, etc?   yes      Grab bars in the bathroom? no      Handrails on the stairs?   yes      Adequate lighting?   yes   Depression Screen PHQ 2/9 Scores 09/09/2019 09/03/2019  PHQ - 2 Score 0 0    Cognitive Function     6CIT Screen 09/09/2019  What Year? 0 points  What month? 0 points  What time? 0 points  Count back from 20 0 points  Months in reverse 0 points  Repeat phrase 0 points  Total Score 0    Immunization History  Administered Date(s) Administered  . PFIZER SARS-COV-2 Vaccination 06/26/2019, 07/17/2019  . Pneumococcal Conjugate-13 03/19/2018  . Pneumococcal Polysaccharide-23 02/21/2017    Qualifies for Shingles Vaccine? YES - recommended getting at pharmacy  Screening Tests Health Maintenance  Topic Date Due  . Hepatitis C Screening  Never done  . TETANUS/TDAP  Never done  . COLONOSCOPY  Never done  . INFLUENZA VACCINE  11/14/2019  . HEMOGLOBIN A1C  03/05/2020  . OPHTHALMOLOGY EXAM  03/18/2020  . FOOT EXAM  09/02/2020  . PNA vac Low Risk Adult (2 of 2 - PPSV23) 02/21/2022  . COVID-19 Vaccine  Completed   Cancer Screenings: Lung: Low Dose CT Chest recommended if Age 56-80 years, 30 pack-year currently smoking OR have quit w/in 15years. Patient does qualify. Colorectal: has order for cologuard       Plan:  Counseling was provided today regarding the following topics: healthy eating habits, home safety, vitamin and mineral supplementation (Multivitiman), regular exercise, tobacco cessation, limitation of alcohol intake, use of seat belts, firearm safety, and fall prevention.  Annual recommendations include: influenza vaccine, dental cleanings, and eye exams.  Recommended vaccines: Shingrix & Td available at pharmacy  Recommended he complete the Cologuard  I have  personally reviewed and noted the following in the patient's chart:   . Medical and social history . Use of alcohol, tobacco or illicit drugs  . Current medications and supplements . Functional ability and status . Nutritional status . Physical activity . Advanced directives . List of other physicians . Hospitalizations, surgeries, and ER visits in previous 12 months . Vitals . Screenings to include cognitive, depression, and falls . Referrals and appointments  In addition, I have reviewed and discussed with patient certain preventive protocols, quality metrics, and best practice recommendations. A written personalized care plan for preventive services as well as general preventive health recommendations were provided to patient.     Erie Noe, LPN  [id number]

## 2019-09-09 NOTE — Patient Instructions (Signed)
 Fall Prevention in the Home, Adult Falls can cause injuries. They can happen to people of all ages. There are many things you can do to make your home safe and to help prevent falls. Ask for help when making these changes, if needed. What actions can I take to prevent falls? General Instructions  Use good lighting in all rooms. Replace any light bulbs that burn out.  Turn on the lights when you go into a dark area. Use night-lights.  Keep items that you use often in easy-to-reach places. Lower the shelves around your home if necessary.  Set up your furniture so you have a clear path. Avoid moving your furniture around.  Do not have throw rugs and other things on the floor that can make you trip.  Avoid walking on wet floors.  If any of your floors are uneven, fix them.  Add color or contrast paint or tape to clearly mark and help you see: ? Any grab bars or handrails. ? First and last steps of stairways. ? Where the edge of each step is.  If you use a stepladder: ? Make sure that it is fully opened. Do not climb a closed stepladder. ? Make sure that both sides of the stepladder are locked into place. ? Ask someone to hold the stepladder for you while you use it.  If there are any pets around you, be aware of where they are. What can I do in the bathroom?      Keep the floor dry. Clean up any water that spills onto the floor as soon as it happens.  Remove soap buildup in the tub or shower regularly.  Use non-skid mats or decals on the floor of the tub or shower.  Attach bath mats securely with double-sided, non-slip rug tape.  If you need to sit down in the shower, use a plastic, non-slip stool.  Install grab bars by the toilet and in the tub and shower. Do not use towel bars as grab bars. What can I do in the bedroom?  Make sure that you have a light by your bed that is easy to reach.  Do not use any sheets or blankets that are too big for your bed. They should  not hang down onto the floor.  Have a firm chair that has side arms. You can use this for support while you get dressed. What can I do in the kitchen?  Clean up any spills right away.  If you need to reach something above you, use a strong step stool that has a grab bar.  Keep electrical cords out of the way.  Do not use floor polish or wax that makes floors slippery. If you must use wax, use non-skid floor wax. What can I do with my stairs?  Do not leave any items on the stairs.  Make sure that you have a light switch at the top of the stairs and the bottom of the stairs. If you do not have them, ask someone to add them for you.  Make sure that there are handrails on both sides of the stairs, and use them. Fix handrails that are broken or loose. Make sure that handrails are as long as the stairways.  Install non-slip stair treads on all stairs in your home.  Avoid having throw rugs at the top or bottom of the stairs. If you do have throw rugs, attach them to the floor with carpet tape.  Choose a carpet that   does not hide the edge of the steps on the stairway.  Check any carpeting to make sure that it is firmly attached to the stairs. Fix any carpet that is loose or worn. What can I do on the outside of my home?  Use bright outdoor lighting.  Regularly fix the edges of walkways and driveways and fix any cracks.  Remove anything that might make you trip as you walk through a door, such as a raised step or threshold.  Trim any bushes or trees on the path to your home.  Regularly check to see if handrails are loose or broken. Make sure that both sides of any steps have handrails.  Install guardrails along the edges of any raised decks and porches.  Clear walking paths of anything that might make someone trip, such as tools or rocks.  Have any leaves, snow, or ice cleared regularly.  Use sand or salt on walking paths during winter.  Clean up any spills in your garage right  away. This includes grease or oil spills. What other actions can I take?  Wear shoes that: ? Have a low heel. Do not wear high heels. ? Have rubber bottoms. ? Are comfortable and fit you well. ? Are closed at the toe. Do not wear open-toe sandals.  Use tools that help you move around (mobility aids) if they are needed. These include: ? Canes. ? Walkers. ? Scooters. ? Crutches.  Review your medicines with your doctor. Some medicines can make you feel dizzy. This can increase your chance of falling. Ask your doctor what other things you can do to help prevent falls. Where to find more information  Centers for Disease Control and Prevention, [employer]: https://cdc.gov  National Institute on Aging: https://go4life.nia.nih.gov Contact a doctor if:  You are afraid of falling at home.  You feel weak, drowsy, or dizzy at home.  You fall at home. Summary  There are many simple things that you can do to make your home safe and to help prevent falls.  Ways to make your home safe include removing tripping hazards and installing grab bars in the bathroom.  Ask for help when making these changes in your home. This information is not intended to replace advice given to you by your health care provider. Make sure you discuss any questions you have with your health care provider. Document Revised: 07/23/2018 Document Reviewed: 11/14/2016 Elsevier Patient Education  2020 Elsevier Inc.   Health Maintenance, Male Adopting a healthy lifestyle and getting preventive care are important in promoting health and wellness. Ask your health care provider about:  The right schedule for you to have regular tests and exams.  Things you can do on your own to prevent diseases and keep yourself healthy. What should I know about diet, weight, and exercise? Eat a healthy diet   Eat a diet that includes plenty of vegetables, fruits, low-fat dairy products, and lean protein.  Do not eat a lot of foods  that are high in solid fats, added sugars, or sodium. Maintain a healthy weight Body mass index (BMI) is a measurement that can be used to identify possible weight problems. It estimates body fat based on height and weight. Your health care provider can help determine your BMI and help you achieve or maintain a healthy weight. Get regular exercise Get regular exercise. This is one of the most important things you can do for your health. Most adults should:  Exercise for at least 150 minutes each week. The   exercise should increase your heart rate and make you sweat (moderate-intensity exercise).  Do strengthening exercises at least twice a week. This is in addition to the moderate-intensity exercise.  Spend less time sitting. Even light physical activity can be beneficial. Watch cholesterol and blood lipids Have your blood tested for lipids and cholesterol at 68 years of age, then have this test every 5 years. You may need to have your cholesterol levels checked more often if:  Your lipid or cholesterol levels are high.  You are older than 68 years of age.  You are at high risk for heart disease. What should I know about cancer screening? Many types of cancers can be detected early and may often be prevented. Depending on your health history and family history, you may need to have cancer screening at various ages. This may include screening for:  Colorectal cancer.  Prostate cancer.  Skin cancer.  Lung cancer. What should I know about heart disease, diabetes, and high blood pressure? Blood pressure and heart disease  High blood pressure causes heart disease and increases the risk of stroke. This is more likely to develop in people who have high blood pressure readings, are of African descent, or are overweight.  Talk with your health care provider about your target blood pressure readings.  Have your blood pressure checked: ? Every 3-5 years if you are 4-73 years of age. ?  Every year if you are 65 years old or older.  If you are between the ages of 93 and 75 and are a current or former smoker, ask your health care provider if you should have a one-time screening for abdominal aortic aneurysm (AAA). Diabetes Have regular diabetes screenings. This checks your fasting blood sugar level. Have the screening done:  Once every three years after age 37 if you are at a normal weight and have a low risk for diabetes.  More often and at a younger age if you are overweight or have a high risk for diabetes. What should I know about preventing infection? Hepatitis B If you have a higher risk for hepatitis B, you should be screened for this virus. Talk with your health care provider to find out if you are at risk for hepatitis B infection. Hepatitis C Blood testing is recommended for:  Everyone born from [birth year] through 1964.  Anyone with known risk factors for hepatitis C. Sexually transmitted infections (STIs)  You should be screened each year for STIs, including gonorrhea and chlamydia, if: ? You are sexually active and are younger than 68 years of age. ? You are older than 68 years of age and your health care provider tells you that you are at risk for this type of infection. ? Your sexual activity has changed since you were last screened, and you are at increased risk for chlamydia or gonorrhea. Ask your health care provider if you are at risk.  Ask your health care provider about whether you are at high risk for HIV. Your health care provider may recommend a prescription medicine to help prevent HIV infection. If you choose to take medicine to prevent HIV, you should first get tested for HIV. You should then be tested every 3 months for as long as you are taking the medicine. Follow these instructions at home: Lifestyle  Do not use any products that contain nicotine or tobacco, such as cigarettes, e-cigarettes, and chewing tobacco. If you need help quitting, ask your  health care provider.  Do not use street  drugs.  Do not share needles.  Ask your health care provider for help if you need support or information about quitting drugs. Alcohol use  Do not drink alcohol if your health care provider tells you not to drink.  If you drink alcohol: ? Limit how much you have to 0-2 drinks a day. ? Be aware of how much alcohol is in your drink. In the [place] one drink equals one 12 oz bottle of beer (355 mL), one 5 oz glass of wine (148 mL), or one 1 oz glass of hard liquor (44 mL). General instructions  Schedule regular health, dental, and eye exams.  Stay current with your vaccines.  Tell your health care provider if: ? You often feel depressed. ? You have ever been abused or do not feel safe at home. Summary  Adopting a healthy lifestyle and getting preventive care are important in promoting health and wellness.  Follow your health care provider's instructions about healthy diet, exercising, and getting tested or screened for diseases.  Follow your health care provider's instructions on monitoring your cholesterol and blood pressure. This information is not intended to replace advice given to you by your health care provider. Make sure you discuss any questions you have with your health care provider. Document Revised: 03/25/2018 Document Reviewed: 03/25/2018 Elsevier Patient Education  2020 [employer].   Steps to Quit Smoking Smoking tobacco is the leading cause of preventable death. It can affect almost every organ in the body. Smoking puts you and people around you at risk for many serious, long-lasting (chronic) diseases. Quitting smoking can be hard, but it is one of the best things that you can do for your health. It is never too late to quit. How do I get ready to quit? When you decide to quit smoking, make a plan to help you succeed. Before you quit:  Pick a date to quit. Set a date within the next 2 weeks to give you time to prepare.   Write down the reasons why you are quitting. Keep this list in places where you will see it often.  Tell your family, friends, and co-workers that you are quitting. Their support is important.  Talk with your doctor about the choices that may help you quit.  Find out if your health insurance will pay for these treatments.  Know the people, places, things, and activities that make you want to smoke (triggers). Avoid them. What first steps can I take to quit smoking?  Throw away all cigarettes at home, at work, and in your car.  Throw away the things that you use when you smoke, such as ashtrays and lighters.  Clean your car. Make sure to empty the ashtray.  Clean your home, including curtains and carpets. What can I do to help me quit smoking? Talk with your doctor about taking medicines and seeing a counselor at the same time. You are more likely to succeed when you do both.  If you are pregnant or breastfeeding, talk with your doctor about counseling or other ways to quit smoking. Do not take medicine to help you quit smoking unless your doctor tells you to do so. To quit smoking: Quit right away  Quit smoking totally, instead of slowly cutting back on how much you smoke over a period of time.  Go to counseling. You are more likely to quit if you go to counseling sessions regularly. Take medicine You may take medicines to help you quit. Some medicines need a  prescription, and some you can buy over-the-counter. Some medicines may contain a drug called nicotine to replace the nicotine in cigarettes. Medicines may:  Help you to stop having the desire to smoke (cravings).  Help to stop the problems that come when you stop smoking (withdrawal symptoms). Your doctor may ask you to use:  Nicotine patches, gum, or lozenges.  Nicotine inhalers or sprays.  Non-nicotine medicine that is taken by mouth. Find resources Find resources and other ways to help you quit smoking and remain  smoke-free after you quit. These resources are most helpful when you use them often. They include:  Online chats with a Veterinary surgeon.  Phone quitlines.  Printed Materials engineer.  Support groups or group counseling.  Text messaging programs.  Mobile phone apps. Use apps on your mobile phone or tablet that can help you stick to your quit plan. There are many free apps for mobile phones and tablets as well as websites. Examples include Quit Guide from the [employer] and smokefree.gov  What things can I do to make it easier to quit?   Talk to your family and friends. Ask them to support and encourage you.  Call a phone quitline (1-800-QUIT-NOW), reach out to support groups, or work with a Veterinary surgeon.  Ask people who smoke to not smoke around you.  Avoid places that make you want to smoke, such as: ? Bars. ? Parties. ? Smoke-break areas at work.  Spend time with people who do not smoke.  Lower the stress in your life. Stress can make you want to smoke. Try these things to help your stress: ? Getting regular exercise. ? Doing deep-breathing exercises. ? Doing yoga. ? Meditating. ? Doing a body scan. To do this, close your eyes, focus on one area of your body at a time from head to toe. Notice which parts of your body are tense. Try to relax the muscles in those areas. How will I feel when I quit smoking? Day 1 to 3 weeks Within the first 24 hours, you may start to have some problems that come from quitting tobacco. These problems are very bad 2-3 days after you quit, but they do not often last for more than 2-3 weeks. You may get these symptoms:  Mood swings.  Feeling restless, nervous, angry, or annoyed.  Trouble concentrating.  Dizziness.  Strong desire for high-sugar foods and nicotine.  Weight gain.  Trouble pooping (constipation).  Feeling like you may vomit (nausea).  Coughing or a sore throat.  Changes in how the medicines that you take for other issues work in  your body.  Depression.  Trouble sleeping (insomnia). Week 3 and afterward After the first 2-3 weeks of quitting, you may start to notice more positive results, such as:  Better sense of smell and taste.  Less coughing and sore throat.  Slower heart rate.  Lower blood pressure.  Clearer skin.  Better breathing.  Fewer sick days. Quitting smoking can be hard. Do not give up if you fail the first time. Some people need to try a few times before they succeed. Do your best to stick to your quit plan, and talk with your doctor if you have any questions or concerns. Summary  Smoking tobacco is the leading cause of preventable death. Quitting smoking can be hard, but it is one of the best things that you can do for your health.  When you decide to quit smoking, make a plan to help you succeed.  Quit smoking right away,  not slowly over a period of time.  When you start quitting, seek help from your doctor, family, or friends. This information is not intended to replace advice given to you by your health care provider. Make sure you discuss any questions you have with your health care provider. Document Revised: 12/25/2018 Document Reviewed: 06/20/2018 Elsevier Patient Education  [place].

## 2019-10-08 DIAGNOSIS — H40013 Open angle with borderline findings, low risk, bilateral: Secondary | ICD-10-CM | POA: Diagnosis not present

## 2019-10-16 ENCOUNTER — Other Ambulatory Visit: Payer: Self-pay | Admitting: Family Medicine

## 2019-11-08 ENCOUNTER — Other Ambulatory Visit: Payer: Self-pay

## 2019-11-08 DIAGNOSIS — E1140 Type 2 diabetes mellitus with diabetic neuropathy, unspecified: Secondary | ICD-10-CM

## 2019-11-08 MED ORDER — GABAPENTIN 300 MG PO CAPS: 300.0000 mg | ORAL_CAPSULE | Freq: Two times a day (BID) | ORAL | 1 refills | Status: DC

## 2019-11-08 NOTE — Telephone Encounter (Signed)
Refill for Gabapentin sent to [facility] in [place].

## 2019-11-15 ENCOUNTER — Other Ambulatory Visit: Payer: Self-pay

## 2019-11-15 DIAGNOSIS — E1140 Type 2 diabetes mellitus with diabetic neuropathy, unspecified: Secondary | ICD-10-CM

## 2019-11-15 MED ORDER — GABAPENTIN 300 MG PO CAPS: 300.0000 mg | ORAL_CAPSULE | Freq: Two times a day (BID) | ORAL | 1 refills | Status: DC

## 2019-12-10 ENCOUNTER — Other Ambulatory Visit: Payer: Self-pay | Admitting: Family Medicine

## 2019-12-17 ENCOUNTER — Other Ambulatory Visit: Payer: Self-pay | Admitting: Family Medicine

## 2019-12-17 DIAGNOSIS — M1A0610 Idiopathic chronic gout, right knee, without tophus (tophi): Secondary | ICD-10-CM

## 2019-12-18 IMAGING — CT CT ABD-PELV W/ CM
2 of 5 series · 16 of 46 positions shown, 18 images · IV contrast (APPLIED)
Comparison: Report 04/04/2005

CLINICAL DATA: Lower abdominal pain

EXAM:
CT ABDOMEN AND PELVIS WITH CONTRAST
TECHNIQUE: Multidetector CT imaging of the abdomen and pelvis was performed
using the standard protocol following bolus administration of
intravenous contrast.
CONTRAST:  100mL [id number] IOPAMIDOL ([id number]) INJECTION 61%

[Series 2: axial st · axial · 0.81mm/px · z∈[+249,+729]mm · 13 of 108 slices shown, 15 images]
[im 6/108  soft-tissue]
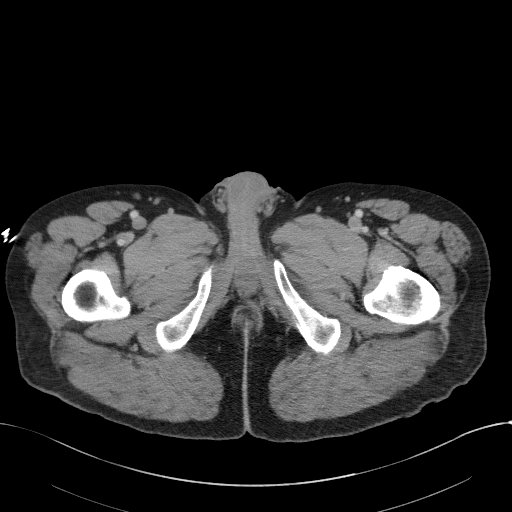
[im 6/108  bone]
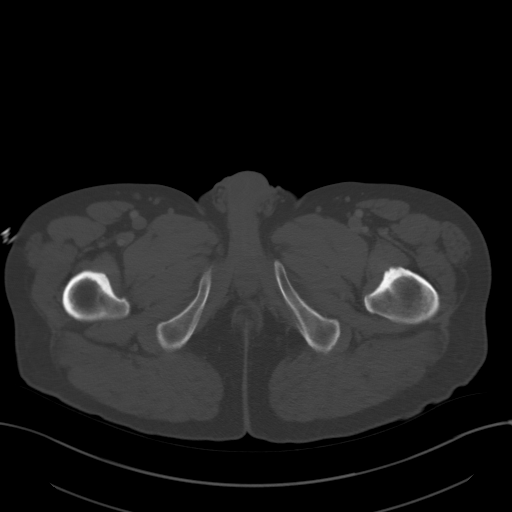
[im 12/108  soft-tissue]
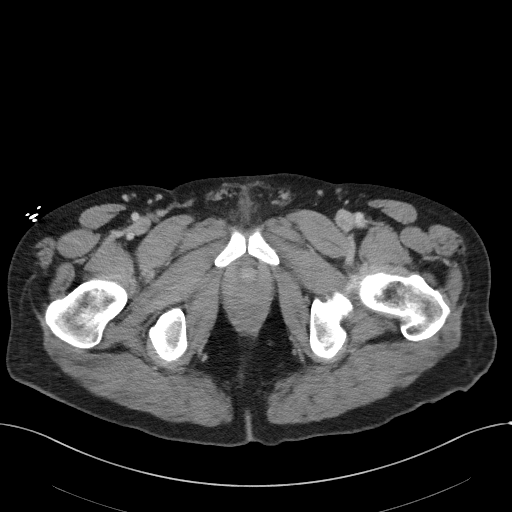
[im 24/108  soft-tissue]
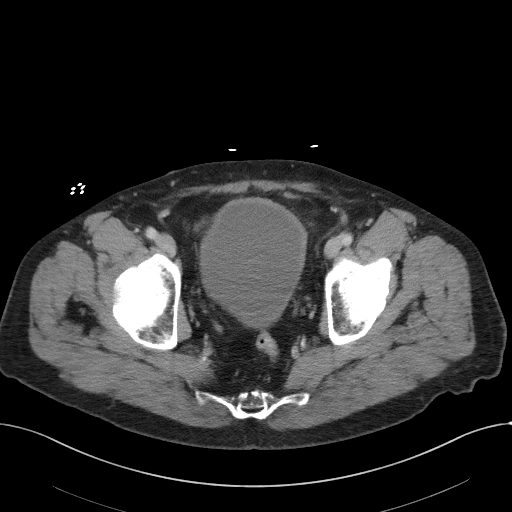
[im 30/108  soft-tissue]
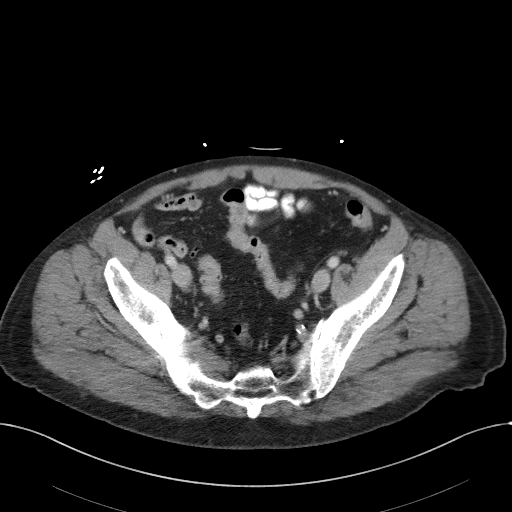
[im 36/108  soft-tissue]
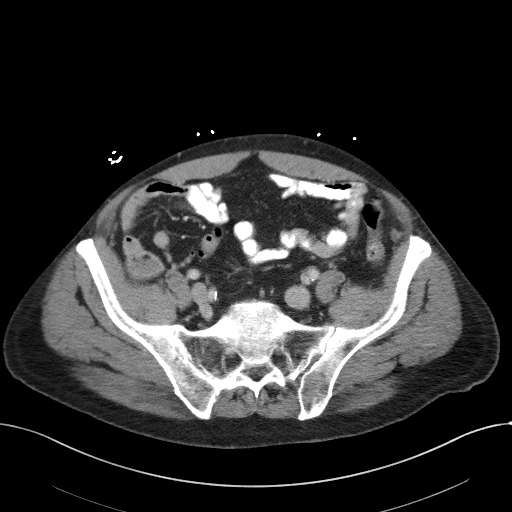
[im 48/108  soft-tissue]
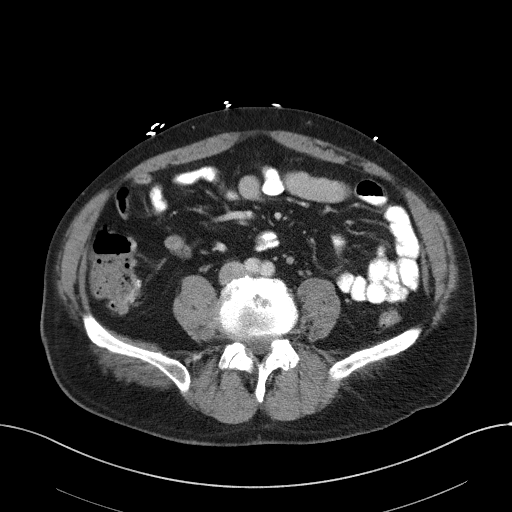
[im 54/108  soft-tissue]
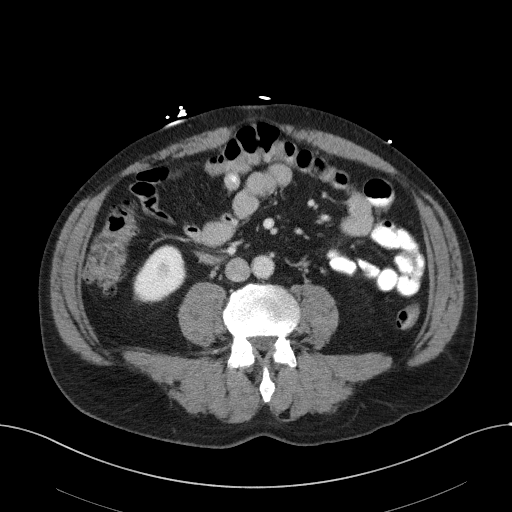
[im 60/108  soft-tissue]
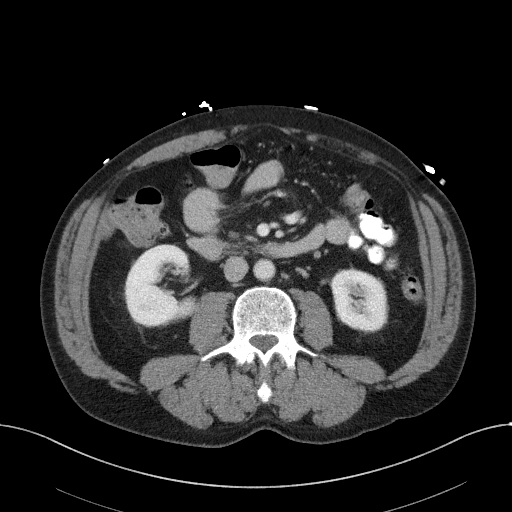
[im 72/108  soft-tissue]
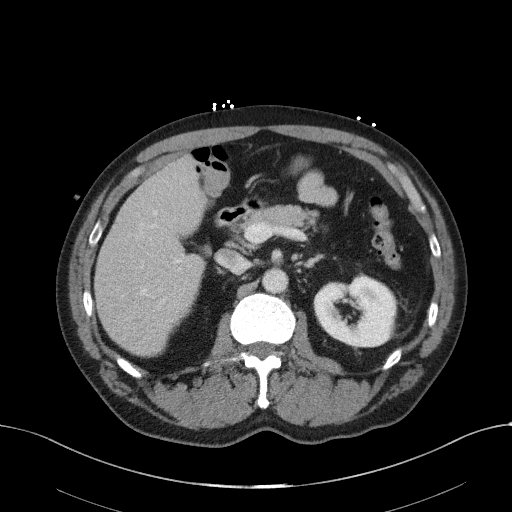
[im 72/108  bone]
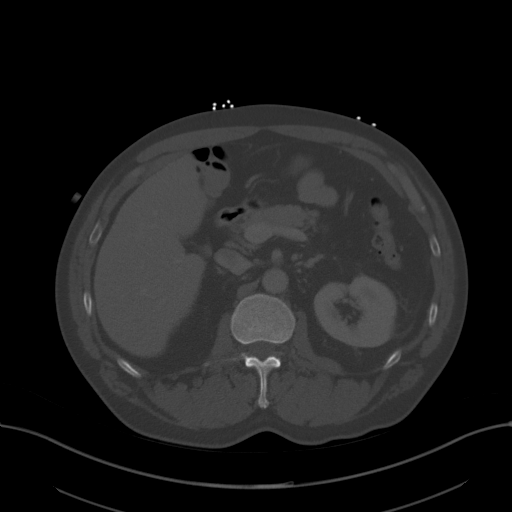
[im 78/108  soft-tissue]
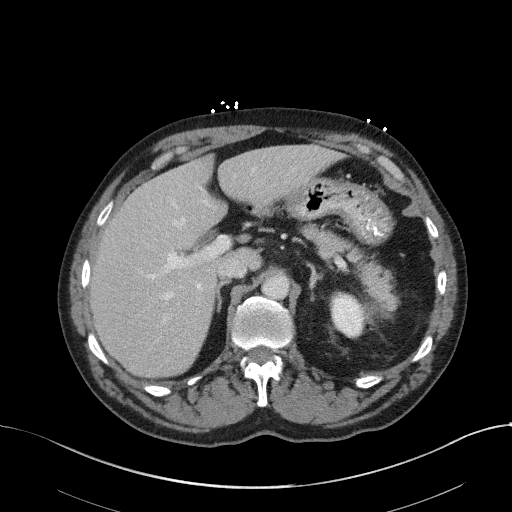
[im 84/108  soft-tissue]
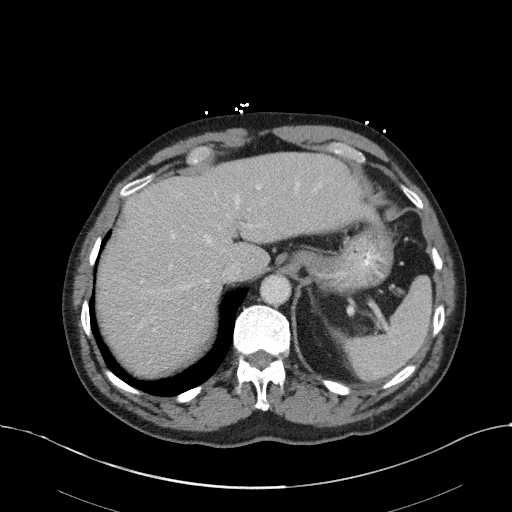
[im 96/108  soft-tissue]
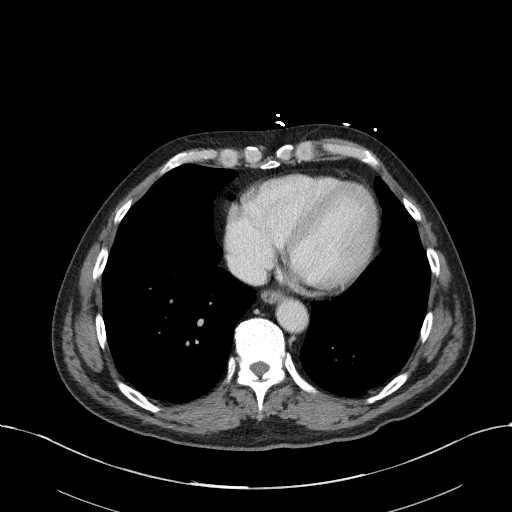
[im 102/108  soft-tissue]
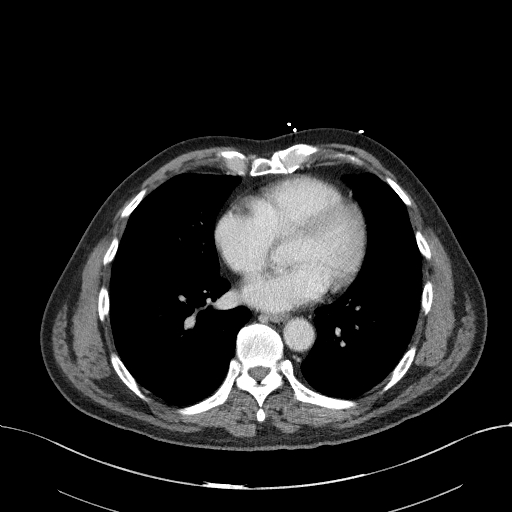

[Series 5: coronal st · coronal · 0.85mm/px · 3 of 100 slices shown]
[im 34/100  soft-tissue]
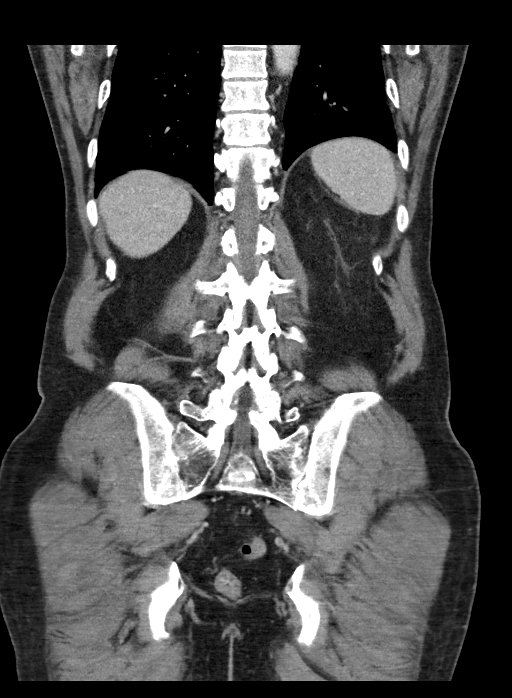
[im 45/100  soft-tissue]
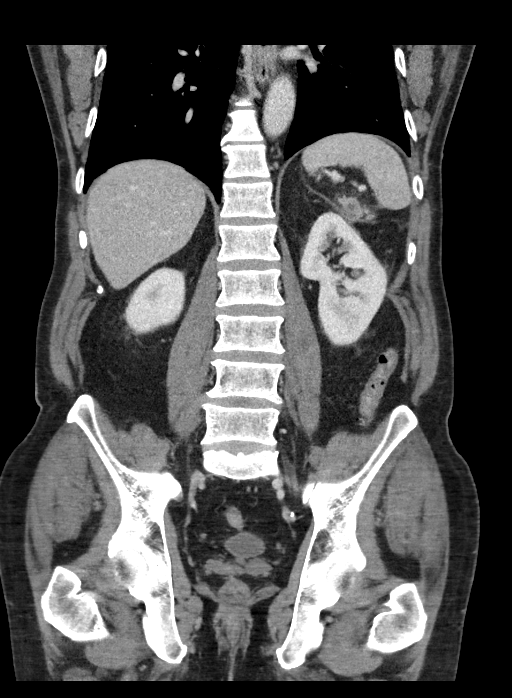
[im 56/100  soft-tissue]
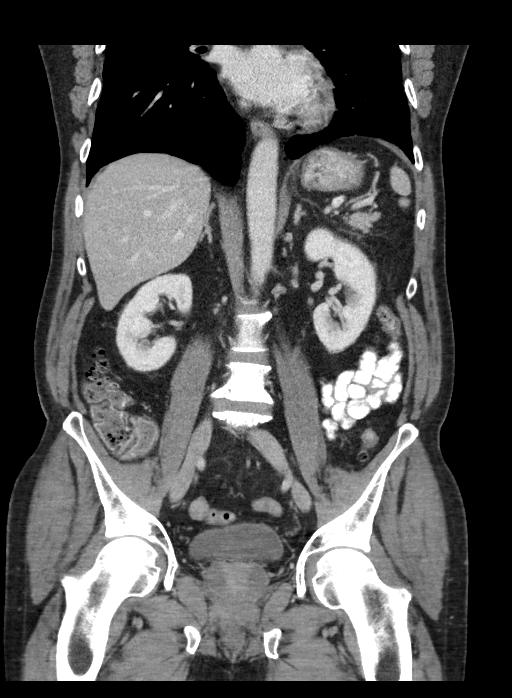

[16 of 46 positions shown; findings below may reference images not displayed]

FINDINGS: Lower chest: No acute abnormality.

Hepatobiliary: No focal liver abnormality is seen. No gallstones,
gallbladder wall thickening, or biliary dilatation.

Pancreas: Edema at the tail of the pancreas with small amount of
fluid and soft tissue stranding in the left anterior pararenal
space. No ductal dilatation. No definite organized fluid collection.

Spleen: Normal in size without focal abnormality.

Adrenals/Urinary Tract: Adrenal glands are unremarkable. Kidneys are
normal, without renal calculi, focal lesion, or hydronephrosis.
Bladder is unremarkable.

Stomach/Bowel: Stomach is within normal limits. Appendix appears
normal. No evidence of bowel wall thickening, distention, or
inflammatory changes. Diffuse diverticular disease of the colon
without acute inflammatory change.

Vascular/Lymphatic: Mild aortic atherosclerosis. No aneurysm. No
significantly enlarged lymph nodes

Reproductive: Slightly enlarged heterogeneous prostate

Other: No free air.  Small fat in the left inguinal canal.

Musculoskeletal: No acute or significant osseous findings.
IMPRESSION: 1. Indistinct appearance of the tail of the pancreas with
surrounding edema and small amount of fluid, suspicious for acute
pancreatitis. No definite organized fluid collection. No evidence
for necrosis.
2. Slightly enlarged prostate
3. Diverticular disease of the colon without acute inflammatory
change.

## 2020-01-14 ENCOUNTER — Ambulatory Visit: Payer: Medicare HMO | Admitting: Family Medicine

## 2020-01-14 ENCOUNTER — Other Ambulatory Visit: Payer: Self-pay

## 2020-01-14 DIAGNOSIS — I10 Essential (primary) hypertension: Secondary | ICD-10-CM

## 2020-01-14 NOTE — Telephone Encounter (Signed)
[name] is requesting Nathaniel Nicholson's Losartan and Atorvastatin be sent to [facility] on [address].  I see the Losartan in his med list but not the Atorvastatin.  He has an upcoming appointment scheduled next week.  [name]

## 2020-01-17 MED ORDER — LOSARTAN POTASSIUM 50 MG PO TABS: 50.0000 mg | ORAL_TABLET | Freq: Every day | ORAL | 0 refills | Status: DC

## 2020-01-19 ENCOUNTER — Encounter: Payer: Self-pay | Admitting: Family Medicine

## 2020-01-19 ENCOUNTER — Other Ambulatory Visit: Payer: Self-pay

## 2020-01-19 ENCOUNTER — Ambulatory Visit (INDEPENDENT_AMBULATORY_CARE_PROVIDER_SITE_OTHER): Payer: Medicare HMO | Admitting: Family Medicine

## 2020-01-19 VITALS — BP 120/76 | HR 84 | Temp 97.1°F | Resp 16 | Ht 75.0 in | Wt 197.8 lb

## 2020-01-19 DIAGNOSIS — Z1211 Encounter for screening for malignant neoplasm of colon: Secondary | ICD-10-CM

## 2020-01-19 DIAGNOSIS — R300 Dysuria: Secondary | ICD-10-CM

## 2020-01-19 DIAGNOSIS — M1A0620 Idiopathic chronic gout, left knee, without tophus (tophi): Secondary | ICD-10-CM

## 2020-01-19 DIAGNOSIS — E782 Mixed hyperlipidemia: Secondary | ICD-10-CM | POA: Diagnosis not present

## 2020-01-19 DIAGNOSIS — Z122 Encounter for screening for malignant neoplasm of respiratory organs: Secondary | ICD-10-CM | POA: Diagnosis not present

## 2020-01-19 DIAGNOSIS — E1142 Type 2 diabetes mellitus with diabetic polyneuropathy: Secondary | ICD-10-CM

## 2020-01-19 DIAGNOSIS — I10 Essential (primary) hypertension: Secondary | ICD-10-CM | POA: Diagnosis not present

## 2020-01-19 DIAGNOSIS — Z23 Encounter for immunization: Secondary | ICD-10-CM | POA: Diagnosis not present

## 2020-01-19 DIAGNOSIS — F17218 Nicotine dependence, cigarettes, with other nicotine-induced disorders: Secondary | ICD-10-CM

## 2020-01-19 DIAGNOSIS — E291 Testicular hypofunction: Secondary | ICD-10-CM

## 2020-01-19 DIAGNOSIS — R634 Abnormal weight loss: Secondary | ICD-10-CM | POA: Diagnosis not present

## 2020-01-19 LAB — POCT URINALYSIS DIP (CLINITEK)
Bilirubin, UA: NEGATIVE
Blood, UA: NEGATIVE
Glucose, UA: >=1000 mg/dL — AB
Ketones, POC UA: NEGATIVE mg/dL
Leukocytes, UA: NEGATIVE
Nitrite, UA: NEGATIVE
Spec Grav, UA: 1.015 (ref 1.010–1.025)
Urobilinogen, UA: NEGATIVE E.U./dL — AB
pH, UA: 6 (ref 5.0–8.0)

## 2020-01-19 LAB — POCT UA - MICROALBUMIN: Microalbumin Ur, POC: 80 mg/L

## 2020-01-19 MED ORDER — LOSARTAN POTASSIUM 100 MG PO TABS: 100.0000 mg | ORAL_TABLET | Freq: Every day | ORAL | 0 refills | Status: DC

## 2020-01-19 MED ORDER — ATORVASTATIN CALCIUM 20 MG PO TABS: 20.0000 mg | ORAL_TABLET | Freq: Every day | ORAL | 1 refills | Status: DC

## 2020-01-19 MED ORDER — EMPAGLIFLOZIN 25 MG PO TABS: 25.0000 mg | ORAL_TABLET | Freq: Every day | ORAL | 0 refills | Status: DC

## 2020-01-19 MED ORDER — GABAPENTIN 300 MG PO CAPS: 300.0000 mg | ORAL_CAPSULE | Freq: Two times a day (BID) | ORAL | 1 refills | Status: DC

## 2020-01-19 MED ORDER — TESTOSTERONE CYPIONATE 200 MG/ML IM SOLN: 250.0000 mg | INTRAMUSCULAR | 5 refills | Status: DC

## 2020-01-19 NOTE — Progress Notes (Signed)
Subjective:  Patient ID: Nathaniel Nicholson, male    DOB: 1950-10-02  Age: 69 y.o. MRN: [id number]  Chief Complaint  Patient presents with  . Diabetes   HPI:   Patient is a 69 year old male who presents for follow-up of his diabetes, hyperlipidemia, hypertension, and GERD.    Patient reports he is eating healthy.  He is very active at work.  He has not been compliant with taking his medications as he ran out of some of them about a month ago (jardiance.)  Sugars are running in the 200s.  Patient checks his feet daily.  Patient has seen the eye doctor in the last 12 months.  He is currently taking Metformin for his diabetes.  He takes losartan for his blood pressure and for diabetic nephropathy.  He takes gabapentin for his diabetic neuropathy.  He is on Nexium for his reflux. He has lost 20 lbs over the summer. He continues to smoke 1/2 ppd and drink 6-8 beers per day.   Rafiel reports left knee pain intermittently and occasionally has foot pain.  This responds well to colchicine.  He has to take this approximately once a week.  He has not currently on anything long-term for his gout.    Hyperlipidemia: pt was on lipitor until one week ago. He eats fairly healthy. He walks for exercise.   He is questioning whether or not we can increase his Cialis to 10 mg once daily to help with his BPH and erectile dysfunction and testosterone injections. Out of testosterone.   Past Medical History:  Diagnosis Date  . Diabetes mellitus without complication (HCC)   . GERD (gastroesophageal reflux disease)   . Hyperlipemia   . Hypertension   . Nicotine dependence, cigarettes, uncomplicated   . Other male erectile dysfunction   . Peptic ulcer 2000  . Testicular hypofunction   . Vitamin D deficiency    No past surgical history on file.  Family History  Problem Relation Age of Onset  . Cancer Father        lung  . Cancer Sister        lung  . Cancer Brother        throat  . Hypertension Other   .  Diabetes Other    Social History   Socioeconomic History  . Marital status: Married    Spouse name: Not on file  . Number of children: Not on file  . Years of education: Not on file  . Highest education level: Not on file  Occupational History  . Occupation: Marine scientist  Tobacco Use  . Smoking status: Current Every Day Smoker    Packs/day: 0.50    Years: 35.00    Pack years: 17.50    Types: Cigarettes  . Smokeless tobacco: Never Used  . Tobacco comment: 1 ppd for > 30 years.  Substance and Sexual Activity  . Alcohol use: Yes    Alcohol/week: 5.0 standard drinks    Types: 5 Cans of beer per week    Comment: daily  . Drug use: No  . Sexual activity: Not on file  Other Topics Concern  . Not on file  Social History Narrative  . Not on file   Social Determinants of Health   Financial Resource Strain:   . Difficulty of Paying Living Expenses: Not on file  Food Insecurity:   . Worried About Programme researcher, broadcasting/film/video in the Last Year: Not on file  . Ran Out of Food in the  Last Year: Not on file  Transportation Needs:   . Lack of Transportation (Medical): Not on file  . Lack of Transportation (Non-Medical): Not on file  Physical Activity:   . Days of Exercise per Week: Not on file  . Minutes of Exercise per Session: Not on file  Stress:   . Feeling of Stress : Not on file  Social Connections:   . Frequency of Communication with Friends and Family: Not on file  . Frequency of Social Gatherings with Friends and Family: Not on file  . Attends Religious Services: Not on file  . Active Member of Clubs or Organizations: Not on file  . Attends Banker Meetings: Not on file  . Marital Status: Not on file    Review of Systems  Constitutional: Negative for chills, diaphoresis, fatigue and fever.  HENT: Negative for congestion, ear pain and sore throat.   Respiratory: Negative for cough and shortness of breath.   Cardiovascular: Negative for chest pain,  palpitations and leg swelling.  Gastrointestinal: Positive for abdominal pain (epigastric pain) and diarrhea (2-3/day. loose.). Negative for constipation, nausea and vomiting.  Endocrine: Positive for polydipsia and polyuria. Negative for polyphagia.  Genitourinary: Positive for dysuria. Negative for frequency and urgency.  Musculoskeletal: Positive for arthralgias (left knee pain - colchicine helps.  Left great Toe pain). Negative for back pain and myalgias.  Neurological: Positive for numbness (Burning and stinging in his feet at times. ). Negative for dizziness and headaches.  Psychiatric/Behavioral: Negative for dysphoric mood.     Objective:  BP 120/76   Pulse 84   Temp (!) 97.1 F (36.2 C)   Resp 16   Ht 6\' 3"  (1.905 m)   Wt 197 lb 12.8 oz (89.7 kg)   BMI 24.72 kg/m   BP/Weight 01/19/2020 09/09/2019 09/03/2019  Systolic BP 120 132 132  Diastolic BP 76 72 86  Wt. (Lbs) 197.8 217 214.8  BMI 24.72 27.12 26.85    Physical Exam Vitals reviewed.  Constitutional:      Appearance: Normal appearance. He is normal weight.  Cardiovascular:     Rate and Rhythm: Normal rate and regular rhythm.  Pulmonary:     Effort: Pulmonary effort is normal.     Breath sounds: Normal breath sounds.  Abdominal:     General: Abdomen is flat. Bowel sounds are normal.     Palpations: Abdomen is soft.  Musculoskeletal:        General: No tenderness.  Neurological:     Mental Status: He is alert and oriented to person, place, and time.  Psychiatric:        Mood and Affect: Mood normal.        Behavior: Behavior normal.     Diabetic Foot Exam - Simple   Simple Foot Form Diabetic Foot exam was performed with the following findings: Yes 01/19/2020  7:45 AM  Visual Inspection See comments: Yes Sensation Testing See comments: Yes Pulse Check Posterior Tibialis and Dorsalis pulse intact bilaterally: Yes Comments Decrease sensation BL feet.  Calluses on bottom of feet.  Very dry skin and  thickened nails.     Peak Flows 400, 400, 400.   Assessment & Plan:  1. Diabetic polyneuropathy associated with type 2 diabetes mellitus (HCC) Control:poor Recommend check sugars fasting daily. Recommend check feet daily. Recommend annual eye exams. Medicines: Restart jardiance 25 mg once daily. Samples given. Continue metformin. Given sample of tresiba in case his sugar jumps up very high. Increase losartan to 100 mg  once daily for his nephropathy.  Continue to work on eating a healthy diet and exercise.  Labs drawn today.   - Hemoglobin A1c - POCT UA - Microalbumin - losartan (COZAAR) 100 MG tablet; Take 1 tablet (100 mg total) by mouth daily.  Dispense: 90 tablet; Refill: 0 - gabapentin (NEURONTIN) 300 MG capsule; Take 1 capsule (300 mg total) by mouth 2 (two) times daily.  Dispense: 180 capsule; Refill: 1 - empagliflozin (JARDIANCE) 25 MG TABS tablet; Take 1 tablet (25 mg total) by mouth daily before breakfast.  Dispense: 90 tablet; Refill: 0  2. Mixed hyperlipidemia Well controlled.  No changes to medicines.  Continue to work on eating a healthy diet and exercise.  Labs drawn today.  - Lipid panel - atorvastatin (LIPITOR) 20 MG tablet; Take 1 tablet (20 mg total) by mouth daily.  Dispense: 90 tablet; Refill: 1  3. Essential hypertension, benign Well controlled.  No changes to medicines.  Continue to work on eating a healthy diet and exercise.  Labs drawn today.  - CBC with Differential/Platelet - Comprehensive metabolic panel - TSH - losartan (COZAAR) 100 MG tablet; Take 1 tablet (100 mg total) by mouth daily.  Dispense: 90 tablet; Refill: 0  4. Hypogonadism, male - testosterone cypionate (DEPOTESTOSTERONE CYPIONATE) 200 MG/ML injection; Inject 1.25 mLs (250 mg total) into the muscle every 14 (fourteen) days.  Dispense: 2 mL; Refill: 5  5. Weight loss - Labs.  - DG Chest 2 View - PSA  6. Dysuria - POCT URINALYSIS DIP (CLINITEK) - NL except glucose > 1000   7.  Idiopathic chronic gout of left knee without tophus The current medical regimen is effective;  continue present plan and medications. - Uric acid  8. Screening for lung cancer - CT CHEST LUNG CA SCREEN LOW DOSE W/O CM  9. Need for immunization against influenza - Flu Vaccine QUAD High Dose(Fluad)  10: Colon cancer screening - order cologuard.   11. Tobacco addiction, cigarettes - unwilling to quit at this time, but will continue to try to cut down. He at one point was a 2 ppd smoker.   Follow-up: Return in about 6 weeks (around 03/01/2020) for not fasting.  An After Visit Summary was printed and given to the patient.  Blane Ohara [facility] [phone]

## 2020-01-19 NOTE — Patient Instructions (Addendum)
Restart jardiance 25 mg once daily. Keep sugar log.  Increase losartan to 100 mg once daily. Ordering chest xray Ordering ct chest for lung cancer screening Ordering cologuard test.

## 2020-01-20 LAB — CBC WITH DIFFERENTIAL/PLATELET
Basophils Absolute: 0 10*3/uL (ref 0.0–0.2)
Basos: 0 %
EOS (ABSOLUTE): 0.1 10*3/uL (ref 0.0–0.4)
Eos: 4 %
Hematocrit: 44.7 % (ref 37.5–51.0)
Hemoglobin: 15.2 g/dL (ref 13.0–17.7)
Immature Grans (Abs): 0 10*3/uL (ref 0.0–0.1)
Immature Granulocytes: 0 %
Lymphocytes Absolute: 1.4 10*3/uL (ref 0.7–3.1)
Lymphs: 44 %
MCH: 32.2 pg (ref 26.6–33.0)
MCHC: 34 g/dL (ref 31.5–35.7)
MCV: 95 fL (ref 79–97)
Monocytes Absolute: 0.4 10*3/uL (ref 0.1–0.9)
Monocytes: 13 %
Neutrophils Absolute: 1.2 10*3/uL — ABNORMAL LOW (ref 1.4–7.0)
Neutrophils: 39 %
Platelets: 186 10*3/uL (ref 150–450)
RBC: 4.72 x10E6/uL (ref 4.14–5.80)
RDW: 11.8 % (ref 11.6–15.4)
WBC: 3.1 10*3/uL — ABNORMAL LOW (ref 3.4–10.8)

## 2020-01-20 LAB — HEMOGLOBIN A1C
Est. average glucose Bld gHb Est-mCnc: 321 mg/dL
Hgb A1c MFr Bld: 12.8 % — ABNORMAL HIGH (ref 4.8–5.6)

## 2020-01-20 LAB — COMPREHENSIVE METABOLIC PANEL
ALT: 24 IU/L (ref 0–44)
AST: 18 IU/L (ref 0–40)
Albumin/Globulin Ratio: 2 (ref 1.2–2.2)
Albumin: 5.1 g/dL — ABNORMAL HIGH (ref 3.8–4.8)
Alkaline Phosphatase: 121 IU/L (ref 44–121)
BUN/Creatinine Ratio: 18 (ref 10–24)
BUN: 22 mg/dL (ref 8–27)
Bilirubin Total: 0.4 mg/dL (ref 0.0–1.2)
CO2: 22 mmol/L (ref 20–29)
Calcium: 10.7 mg/dL — ABNORMAL HIGH (ref 8.6–10.2)
Chloride: 97 mmol/L (ref 96–106)
Creatinine, Ser: 1.24 mg/dL (ref 0.76–1.27)
GFR calc Af Amer: 69 mL/min/{1.73_m2} (ref >59)
GFR calc non Af Amer: 60 mL/min/{1.73_m2} (ref >59)
Globulin, Total: 2.5 g/dL (ref 1.5–4.5)
Glucose: 276 mg/dL — ABNORMAL HIGH (ref 65–99)
Potassium: 5.5 mmol/L — ABNORMAL HIGH (ref 3.5–5.2)
Sodium: 135 mmol/L (ref 134–144)
Total Protein: 7.6 g/dL (ref 6.0–8.5)

## 2020-01-20 LAB — URIC ACID: Uric Acid: 4.6 mg/dL (ref 3.8–8.4)

## 2020-01-20 LAB — LIPID PANEL
Chol/HDL Ratio: 4.1 ratio (ref 0.0–5.0)
Cholesterol, Total: 183 mg/dL (ref 100–199)
HDL: 45 mg/dL (ref >39)
LDL Chol Calc (NIH): 96 mg/dL (ref 0–99)
Triglycerides: 247 mg/dL — ABNORMAL HIGH (ref 0–149)
VLDL Cholesterol Cal: 42 mg/dL — ABNORMAL HIGH (ref 5–40)

## 2020-01-20 LAB — TSH: TSH: 1.64 u[IU]/mL (ref 0.450–4.500)

## 2020-01-20 LAB — PSA: Prostate Specific Ag, Serum: 0.5 ng/mL (ref 0.0–4.0)

## 2020-01-20 LAB — CARDIOVASCULAR RISK ASSESSMENT

## 2020-01-21 ENCOUNTER — Other Ambulatory Visit: Payer: Self-pay

## 2020-01-21 DIAGNOSIS — E875 Hyperkalemia: Secondary | ICD-10-CM

## 2020-01-27 ENCOUNTER — Other Ambulatory Visit: Payer: Self-pay

## 2020-01-27 ENCOUNTER — Ambulatory Visit: Payer: Medicare HMO

## 2020-01-27 ENCOUNTER — Ambulatory Visit (INDEPENDENT_AMBULATORY_CARE_PROVIDER_SITE_OTHER): Payer: Medicare Other

## 2020-01-27 DIAGNOSIS — Z23 Encounter for immunization: Secondary | ICD-10-CM | POA: Diagnosis not present

## 2020-01-27 NOTE — Progress Notes (Signed)
   Covid-[facility] Vaccination [facility]  Name:  Nathaniel Nicholson    MRN: [id number] DOB: [birth year]  01/27/2020  [name] was observed post Covid-19 immunization for 15 minutes without incident. He was provided with Vaccine Information Sheet and instruction to access the V-Safe system.   [name] was instructed to call 911 with any severe reactions post vaccine: Marland Kitchen Difficulty breathing  . Swelling of face and throat  . A fast heartbeat  . A bad rash all over body  . Dizziness and weakness

## 2020-01-28 LAB — CBC
Hematocrit: 38.4 % (ref 37.5–51.0)
Hemoglobin: 13.1 g/dL (ref 13.0–17.7)
MCH: 33 pg (ref 26.6–33.0)
MCHC: 34.1 g/dL (ref 31.5–35.7)
MCV: 97 fL (ref 79–97)
Platelets: 171 10*3/uL (ref 150–450)
RBC: 3.97 x10E6/uL — ABNORMAL LOW (ref 4.14–5.80)
RDW: 11.6 % (ref 11.6–15.4)
WBC: 3.9 10*3/uL (ref 3.4–10.8)

## 2020-01-28 LAB — COMPREHENSIVE METABOLIC PANEL
ALT: 22 IU/L (ref 0–44)
AST: 18 IU/L (ref 0–40)
Albumin/Globulin Ratio: 2.4 — ABNORMAL HIGH (ref 1.2–2.2)
Albumin: 4.3 g/dL (ref 3.8–4.8)
Alkaline Phosphatase: 97 IU/L (ref 44–121)
BUN/Creatinine Ratio: 23 (ref 10–24)
BUN: 22 mg/dL (ref 8–27)
Bilirubin Total: 0.3 mg/dL (ref 0.0–1.2)
CO2: 22 mmol/L (ref 20–29)
Calcium: 9.6 mg/dL (ref 8.6–10.2)
Chloride: 99 mmol/L (ref 96–106)
Creatinine, Ser: 0.96 mg/dL (ref 0.76–1.27)
GFR calc Af Amer: 94 mL/min/{1.73_m2} (ref >59)
GFR calc non Af Amer: 81 mL/min/{1.73_m2} (ref >59)
Globulin, Total: 1.8 g/dL (ref 1.5–4.5)
Glucose: 235 mg/dL — ABNORMAL HIGH (ref 65–99)
Potassium: 4.6 mmol/L (ref 3.5–5.2)
Sodium: 141 mmol/L (ref 134–144)
Total Protein: 6.1 g/dL (ref 6.0–8.5)

## 2020-02-28 ENCOUNTER — Other Ambulatory Visit: Payer: Self-pay | Admitting: Family Medicine

## 2020-03-01 ENCOUNTER — Ambulatory Visit: Payer: Medicare HMO | Admitting: Family Medicine

## 2020-03-02 ENCOUNTER — Telehealth: Payer: Self-pay

## 2020-03-02 MED ORDER — METFORMIN HCL 1000 MG PO TABS
1000.0000 mg | ORAL_TABLET | Freq: Two times a day (BID) | ORAL | 0 refills | Status: DC
Start: 2020-03-02 — End: 2020-08-29

## 2020-03-27 DIAGNOSIS — E119 Type 2 diabetes mellitus without complications: Secondary | ICD-10-CM | POA: Diagnosis not present

## 2020-03-27 DIAGNOSIS — H25811 Combined forms of age-related cataract, right eye: Secondary | ICD-10-CM | POA: Diagnosis not present

## 2020-03-27 DIAGNOSIS — Z961 Presence of intraocular lens: Secondary | ICD-10-CM | POA: Diagnosis not present

## 2020-03-27 DIAGNOSIS — H40013 Open angle with borderline findings, low risk, bilateral: Secondary | ICD-10-CM | POA: Diagnosis not present

## 2020-03-27 LAB — HM DIABETES EYE EXAM

## 2020-03-28 ENCOUNTER — Other Ambulatory Visit: Payer: Self-pay | Admitting: Family Medicine

## 2020-03-28 DIAGNOSIS — M1A0610 Idiopathic chronic gout, right knee, without tophus (tophi): Secondary | ICD-10-CM

## 2020-04-04 ENCOUNTER — Encounter: Payer: Self-pay | Admitting: Family Medicine

## 2020-04-04 ENCOUNTER — Other Ambulatory Visit: Payer: Self-pay

## 2020-04-04 ENCOUNTER — Ambulatory Visit (INDEPENDENT_AMBULATORY_CARE_PROVIDER_SITE_OTHER): Payer: Medicare HMO | Admitting: Family Medicine

## 2020-04-04 VITALS — BP 130/78 | HR 80 | Temp 97.0°F | Ht 75.0 in | Wt 210.2 lb

## 2020-04-04 DIAGNOSIS — I10 Essential (primary) hypertension: Secondary | ICD-10-CM

## 2020-04-04 DIAGNOSIS — E291 Testicular hypofunction: Secondary | ICD-10-CM

## 2020-04-04 DIAGNOSIS — M1A0620 Idiopathic chronic gout, left knee, without tophus (tophi): Secondary | ICD-10-CM | POA: Diagnosis not present

## 2020-04-04 DIAGNOSIS — F17218 Nicotine dependence, cigarettes, with other nicotine-induced disorders: Secondary | ICD-10-CM

## 2020-04-04 DIAGNOSIS — E782 Mixed hyperlipidemia: Secondary | ICD-10-CM

## 2020-04-04 DIAGNOSIS — E1142 Type 2 diabetes mellitus with diabetic polyneuropathy: Secondary | ICD-10-CM

## 2020-04-04 DIAGNOSIS — R69 Illness, unspecified: Secondary | ICD-10-CM | POA: Diagnosis not present

## 2020-04-04 NOTE — Patient Instructions (Addendum)
Increase losartan to 100 mg total (may double losartan 50 mg 2 daily until gone.) Continue metformin 1000 mg one twice a day.    Gout  Gout is painful swelling of your joints. Gout is a type of arthritis. It is caused by having too much uric acid in your body. Uric acid is a chemical that is made when your body breaks down substances called purines. If your body has too much uric acid, sharp crystals can form and build up in your joints. This causes pain and swelling. Gout attacks can happen quickly and be very painful (acute gout). Over time, the attacks can affect more joints and happen more often (chronic gout). What are the causes?  Too much uric acid in your blood. This can happen because: ? Your kidneys do not remove enough uric acid from your blood. ? Your body makes too much uric acid. ? You eat too many foods that are high in purines. These foods include organ meats, some seafood, and beer.  Trauma or stress. What increases the risk?  Having a family history of gout.  Being male and middle-aged.  Being male and having gone through menopause.  Being very overweight (obese).  Drinking alcohol, especially beer.  Not having enough water in the body (being dehydrated).  Losing weight too quickly.  Having an organ transplant.  Having lead poisoning.  Taking certain medicines.  Having kidney disease.  Having a skin condition called psoriasis. What are the signs or symptoms? An attack of acute gout usually happens in just one joint. The most common place is the big toe. Attacks often start at night. Other joints that may be affected include joints of the feet, ankle, knee, fingers, wrist, or elbow. Symptoms of an attack may include:  Very bad pain.  Warmth.  Swelling.  Stiffness.  Shiny, red, or purple skin.  Tenderness. The affected joint may be very painful to touch.  Chills and fever. Chronic gout may cause symptoms more often. More joints may be  involved. You may also have white or yellow lumps (tophi) on your hands or feet or in other areas near your joints. How is this treated?  Treatment for this condition has two phases: treating an acute attack and preventing future attacks.  Acute gout treatment may include: ? NSAIDs. ? Steroids. These are taken by mouth or injected into a joint. ? Colchicine. This medicine relieves pain and swelling. It can be given by mouth or through an IV tube.  Preventive treatment may include: ? Taking small doses of NSAIDs or colchicine daily. ? Using a medicine that reduces uric acid levels in your blood. ? Making changes to your diet. You may need to see a food expert (dietitian) about what to eat and drink to prevent gout. Follow these instructions at home: During a gout attack   If told, put ice on the painful area: ? Put ice in a plastic bag. ? Place a towel between your skin and the bag. ? Leave the ice on for 20 minutes, 2-3 times a day.  Raise (elevate) the painful joint above the level of your heart as often as you can.  Rest the joint as much as possible. If the joint is in your leg, you may be given crutches.  Follow instructions from your doctor about what you cannot eat or drink. Avoiding future gout attacks  Eat a low-purine diet. Avoid foods and drinks such as: ? Liver. ? Kidney. ? Anchovies. ? Asparagus. ? Herring. ?  Mushrooms. ? Mussels. ? Beer.  Stay at a healthy weight. If you want to lose weight, talk with your doctor. Do not lose weight too fast.  Start or continue an exercise plan as told by your doctor. Eating and drinking  Drink enough fluids to keep your pee (urine) pale yellow.  If you drink alcohol: ? Limit how much you use to:  0-1 drink a day for women.  0-2 drinks a day for men. ? Be aware of how much alcohol is in your drink. In the [place] one drink equals one 12 oz bottle of beer (355 mL), one 5 oz glass of wine (148 mL), or one 1 oz glass of  hard liquor (44 mL). General instructions  Take over-the-counter and prescription medicines only as told by your doctor.  Do not drive or use heavy machinery while taking prescription pain medicine.  Return to your normal activities as told by your doctor. Ask your doctor what activities are safe for you.  Keep all follow-up visits as told by your doctor. This is important. Contact a doctor if:  You have another gout attack.  You still have symptoms of a gout attack after 10 days of treatment.  You have problems (side effects) because of your medicines.  You have chills or a fever.  You have burning pain when you pee (urinate).  You have pain in your lower back or belly. Get help right away if:  You have very bad pain.  Your pain cannot be controlled.  You cannot pee. Summary  Gout is painful swelling of the joints.  The most common site of pain is the big toe, but it can affect other joints.  Medicines and avoiding some foods can help to prevent and treat gout attacks. This information is not intended to replace advice given to you by your health care provider. Make sure you discuss any questions you have with your health care provider. Document Revised: 10/22/2017 Document Reviewed: 10/22/2017 Elsevier Patient Education  2020 [employer]  Tobacco Use Disorder Tobacco use disorder (TUD) occurs when a person craves, seeks, and uses tobacco, regardless of the consequences. This disorder can cause problems with mental and physical health. It can affect your ability to have healthy relationships, and it can keep you from meeting your responsibilities at work, home, or school. Tobacco may be:  Smoked as a cigarette or cigar.  Inhaled using e-cigarettes.  Smoked in a pipe or hookah.  Chewed as smokeless tobacco.  Inhaled into the nostrils as snuff. Tobacco products contain a dangerous chemical called nicotine, which is very addictive. Nicotine triggers hormones  that make the body feel stimulated and works on areas of the brain that make you feel good. These effects can make it hard for people to quit nicotine. Tobacco contains many other unsafe chemicals that can damage almost every organ in the body. Smoking tobacco also puts others in danger due to fire risk and possible health problems caused by breathing in secondhand smoke. What are the signs or symptoms? Symptoms of TUD may include:  Being unable to slow down or stop your tobacco use.  Spending an abnormal amount of time getting or using tobacco.  Craving tobacco. Cravings may last for up to 6 months after quitting.  Tobacco use that: ? Interferes with your work, school, or home life. ? Interferes with your personal and social relationships. ? Makes you give up activities that you once enjoyed or found important.  Using tobacco even though you  know that it is: ? Dangerous or bad for your health or someone else's health. ? Causing problems in your life.  Needing more and more of the substance to get the same effect (developing tolerance).  Experiencing unpleasant symptoms if you do not use the substance (withdrawal). Withdrawal symptoms may include: ? Depressed, anxious, or irritable mood. ? Difficulty concentrating. ? Increased appetite. ? Restlessness or trouble sleeping.  Using the substance to avoid withdrawal. How is this diagnosed? This condition may be diagnosed based on:  Your current and past tobacco use. Your health care provider may ask questions about how your tobacco use affects your life.  A physical exam. You may be diagnosed with TUD if you have at least two symptoms within a 3734-month period. How is this treated? This condition is treated by stopping tobacco use. Many people are unable to quit on their own and need help. Treatment may include:  Nicotine replacement therapy (NRT). NRT provides nicotine without the other harmful chemicals in tobacco. NRT gradually  lowers the dosage of nicotine in the body and reduces withdrawal symptoms. NRT is available as: ? Over-the-counter gums, lozenges, and skin patches. ? Prescription mouth inhalers and nasal sprays.  Medicine that acts on the brain to reduce cravings and withdrawal symptoms.  A type of talk therapy that examines your triggers for tobacco use, how to avoid them, and how to cope with cravings (behavioral therapy).  Hypnosis. This may help with withdrawal symptoms.  Joining a support group for others coping with TUD. The best treatment for TUD is usually a combination of medicine, talk therapy, and support groups. Recovery can be a long process. Many people start using tobacco again after stopping (relapse). If you relapse, it does not mean that treatment will not work. Follow these instructions at home:  Lifestyle  Do not use any products that contain nicotine or tobacco, such as cigarettes and e-cigarettes.  Avoid things that trigger tobacco use as much as you can. Triggers include people and situations that usually cause you to use tobacco.  Avoid drinks that contain caffeine, including coffee. These may worsen some withdrawal symptoms.  Find ways to manage stress. Wanting to smoke may cause stress, and stress can make you want to smoke. Relaxation techniques such as deep breathing, meditation, and yoga may help.  Attend support groups as needed. These groups are an important part of long-term recovery for many people. General instructions  Take over-the-counter and prescription medicines only as told by your health care provider.  Check with your health care provider before taking any new prescription or over-the-counter medicines.  Decide on a friend, family member, or smoking quit-line (such as 1-800-QUIT-NOW in the [place] that you can call or text when you feel the urge to smoke or when you need help coping with cravings.  Keep all follow-up visits as told by your health care  provider and therapist. This is important. Contact a health care provider if:  You are not able to take your medicines as prescribed.  Your symptoms get worse, even with treatment. Summary  Tobacco use disorder (TUD) occurs when a person craves, seeks, and uses tobacco regardless of the consequences.  This condition may be diagnosed based on your current and past tobacco use and a physical exam.  Many people are unable to quit on their own and need help. Recovery can be a long process.  The most effective treatment for TUD is usually a combination of medicine, talk therapy, and support groups. This  information is not intended to replace advice given to you by your health care provider. Make sure you discuss any questions you have with your health care provider. Document Revised: 03/19/2017 Document Reviewed: 03/19/2017 Elsevier Patient Education  2020 [employer].

## 2020-04-04 NOTE — Progress Notes (Signed)
Subjective:  Patient ID: Nathaniel Nicholson, male    DOB: 1950-07-06  Age: 69 y.o. MRN: [id number]  Chief Complaint  Patient presents with  . 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  Patient has been working on his diet.  He is eating healthier and has decreased his sweet drinks.Marland Kitchen  He is very active at work.  He took the Jardiance samples however the prescription for [place] Pepper was too expensive.  He has been taking Metformin 1000 mg twice daily.  His sugars are being checked every day and are running 120-125.   He checks his feet daily.  He saw the eye doctor last week.  He is got a small cataract on the right IN his left cataract lens which has been replaced apparently and has a small filament.  He was supposed to increase his losartan to 100 mg total daily for his blood pressure and also for his nephropathy.  He is taking gabapentin 3 times daily which is helping his neuropathy.  Reflux is fairly well controlled on Nexium.  Gout: Patient takes allopurinol 100 mg once daily.  My intention was for him to take his colchicine once daily until we get the allopurinol in his system however this was not understood.  The patient is only been taking allopurinol.  He has had 1 flareup of his gout last week which he took colchicine 1 to 2 pills and it resolved.  Hyperlipidemia: Patient is taking his Lipitor daily.  He eats fairly healthy. He walks for exercise.   Hypogonadism.  Patient takes testosterone shots which has helped.  Current Outpatient Medications on File Prior to Visit  Medication Sig Dispense Refill  . allopurinol (ZYLOPRIM) 100 MG tablet Take 1 tablet by mouth once daily 90 tablet 0  . atorvastatin (LIPITOR) 20 MG tablet Take 1 tablet (20 mg total) by mouth daily. 90 tablet 1  . colchicine 0.6 MG tablet Take 1 tablet by mouth  twice daily 14 tablet 0  . esomeprazole (NEXIUM) 40 MG capsule Take 40 mg by mouth daily at 12 noon.    . gabapentin (NEURONTIN) 300 MG capsule Take 1 capsule (300 mg total) by mouth 2 (two) times daily. 180 capsule 1  . losartan (COZAAR) 100 MG tablet Take 1 tablet (100 mg total) by mouth daily. 90 tablet 0  . metFORMIN (GLUCOPHAGE) 1000 MG tablet Take 1 tablet (1,000 mg total) by mouth 2 (two) times daily with a meal. 180 tablet 0  . tadalafil (CIALIS) 10 MG tablet Take 1 tablet (10 mg total) by mouth daily. 30 tablet 3  . testosterone cypionate (DEPOTESTOSTERONE CYPIONATE) 200 MG/ML injection Inject 1.25 mLs (250 mg total) into the muscle every 14 (fourteen) days. 2 mL 5  . timolol (TIMOPTIC) 0.25 % ophthalmic solution 1 drop 2 (two) times daily.     No current facility-administered medications on file prior to visit.   Past Medical History:  Diagnosis Date  . Diabetes mellitus without complication (HCC)   . GERD (gastroesophageal reflux disease)   . Hyperlipemia   . Hypertension   . Nicotine dependence, cigarettes, uncomplicated   . Other male erectile dysfunction   . Peptic ulcer 2000  . Testicular hypofunction   . Vitamin D deficiency    History reviewed. No pertinent surgical history.  Family History  Problem Relation Age of Onset  .  Cancer Father        lung  . Cancer Sister        lung  . Cancer Brother        throat  . Hypertension Other   . Diabetes Other    Social History   Socioeconomic History  . Marital status: Married    Spouse name: Not on file  . Number of children: Not on file  . Years of education: Not on file  . Highest education level: Not on file  Occupational History  . Occupation: Marine scientist  Tobacco Use  . Smoking status: Current Every Day Smoker    Packs/day: 0.50    Years: 35.00    Pack years: 17.50    Types: Cigarettes  . Smokeless tobacco: Never Used  . Tobacco comment: 1 ppd for > 30 years.  Substance and Sexual Activity  .  Alcohol use: Yes    Alcohol/week: 5.0 standard drinks    Types: 5 Cans of beer per week    Comment: daily  . Drug use: No  . Sexual activity: Not on file  Other Topics Concern  . Not on file  Social History Narrative  . Not on file   Social Determinants of Health   Financial Resource Strain: Not on file  Food Insecurity: Not on file  Transportation Needs: Not on file  Physical Activity: Not on file  Stress: Not on file  Social Connections: Not on file    Review of Systems  Constitutional: Negative for chills, fatigue and fever.  HENT: Negative for congestion, ear pain and sore throat.   Respiratory: Negative for cough and shortness of breath.   Cardiovascular: Negative for chest pain.  Gastrointestinal: Negative for abdominal pain, constipation, diarrhea, nausea and vomiting.  Endocrine: Negative for polydipsia, polyphagia and polyuria.  Genitourinary: Negative for dysuria and frequency.  Musculoskeletal: Positive for arthralgias (left toe gouty flare up.). Negative for myalgias.  Neurological: Negative for dizziness, numbness and headaches.  Psychiatric/Behavioral: Negative for dysphoric mood.       No dysphoria     Objective:  BP 130/78 (BP Location: Right Arm, Patient Position: Sitting, Cuff Size: Normal)   Pulse 80   Temp (!) 97 F (36.1 C) (Temporal)   Ht 6\' 3"  (1.905 m)   Wt 210 lb 3.2 oz (95.3 kg)   SpO2 95%   BMI 26.27 kg/m   BP/Weight 04/04/2020 01/19/2020 09/09/2019  Systolic BP 130 120 132  Diastolic BP 78 76 72  Wt. (Lbs) 210.2 197.8 217  BMI 26.27 24.72 27.12    Physical Exam Vitals reviewed.  Constitutional:      Appearance: Normal appearance.  Neck:     Vascular: No carotid bruit.  Cardiovascular:     Rate and Rhythm: Normal rate and regular rhythm.     Pulses: Normal pulses.     Heart sounds: Normal heart sounds.  Pulmonary:     Effort: Pulmonary effort is normal.     Breath sounds: Normal breath sounds. No wheezing, rhonchi or rales.   Abdominal:     General: Bowel sounds are normal.     Palpations: Abdomen is soft.     Tenderness: There is no abdominal tenderness.  Neurological:     Mental Status: He is alert.  Psychiatric:        Mood and Affect: Mood normal.        Behavior: Behavior normal.     Diabetic Foot Exam - Simple   Simple Foot Form Diabetic  Foot exam was performed with the following findings: Yes 04/04/2020  8:13 AM  Visual Inspection See comments: Yes Sensation Testing Intact to touch and monofilament testing bilaterally: Yes Pulse Check Posterior Tibialis and Dorsalis pulse intact bilaterally: Yes Comments Dry skin no calluses      Lab Results  Component Value Date   WBC 3.9 01/27/2020   HGB 13.1 01/27/2020   HCT 38.4 01/27/2020   PLT 171 01/27/2020   GLUCOSE 235 (H) 01/27/2020   CHOL 183 01/19/2020   TRIG 247 (H) 01/19/2020   HDL 45 01/19/2020   LDLCALC 96 01/19/2020   ALT 22 01/27/2020   AST 18 01/27/2020   NA 141 01/27/2020   K 4.6 01/27/2020   CL 99 01/27/2020   CREATININE 0.96 01/27/2020   BUN 22 01/27/2020   CO2 22 01/27/2020   TSH 1.640 01/19/2020   HGBA1C 12.8 (H) 01/19/2020   MICROALBUR 80 01/19/2020      Assessment & Plan:   1. 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  Increase losartan to 100 mg once daily. Continue eating healthy and exercise. Check sugars daily. Check feet daily. Patient is too early for his lab work will have him come in for a nurse visit in approximately 2 weeks.  2. Mixed hyperlipidemia Continue Lipitor. Continue low-fat diet and exercise. Return for labs in 2 weeks.  3. Essential hypertension, benign Increase losartan to 100 mg daily.  Hopefully this will help control blood pressure as well as help with his nephropathy.  4. Hypogonadism, male Continue testosterone shots as previously prescribed.  5. Cigarette nicotine dependence with other  nicotine-induced disorder Recommend cessation.  Education given.  6. Idiopathic chronic gout of left knee without tophus Continue allopurinol 100 mg once daily.  Has colchicine in case he has a flareup.  7.  Colon cancer screening-encourage patient to send Cologuard test.  8. Lung cancer screening: Reorder CT scan of the lungs as they have not received a call yet.  9. Order chronic care management to help with cost of medications. As I anticipate patient will need another diabetic medication.   Orders Placed This Encounter  Procedures  . CBC with Differential/Platelet  . Comprehensive metabolic panel  . Hemoglobin A1c  . Lipid panel  . CCM pharmacy monitoring  . POCT UA - Microalbumin  . HM DIABETES EYE EXAM     Follow-up: Return in about 3 weeks (around 04/25/2020) for For labwork. Will need a follow up in 3 months from the time he gets his labwork..  An After Visit Summary was printed and given to the patient.  Blane Ohara, MD [facility] [phone]

## 2020-05-01 ENCOUNTER — Other Ambulatory Visit: Payer: Medicare HMO

## 2020-05-03 ENCOUNTER — Other Ambulatory Visit: Payer: Medicare HMO

## 2020-05-09 ENCOUNTER — Other Ambulatory Visit: Payer: Self-pay | Admitting: Family Medicine

## 2020-05-09 DIAGNOSIS — M1A0610 Idiopathic chronic gout, right knee, without tophus (tophi): Secondary | ICD-10-CM

## 2020-05-16 ENCOUNTER — Other Ambulatory Visit: Payer: Medicare HMO

## 2020-05-17 ENCOUNTER — Other Ambulatory Visit: Payer: Medicare HMO

## 2020-05-17 DIAGNOSIS — E782 Mixed hyperlipidemia: Secondary | ICD-10-CM | POA: Diagnosis not present

## 2020-05-17 DIAGNOSIS — I10 Essential (primary) hypertension: Secondary | ICD-10-CM | POA: Diagnosis not present

## 2020-05-17 DIAGNOSIS — E1142 Type 2 diabetes mellitus with diabetic polyneuropathy: Secondary | ICD-10-CM | POA: Diagnosis not present

## 2020-05-18 LAB — CBC WITH DIFFERENTIAL/PLATELET
Basophils Absolute: 0 10*3/uL (ref 0.0–0.2)
Basos: 1 %
EOS (ABSOLUTE): 0.2 10*3/uL (ref 0.0–0.4)
Eos: 4 %
Hematocrit: 39.2 % (ref 37.5–51.0)
Hemoglobin: 13.7 g/dL (ref 13.0–17.7)
Immature Grans (Abs): 0 10*3/uL (ref 0.0–0.1)
Immature Granulocytes: 0 %
Lymphocytes Absolute: 1.6 10*3/uL (ref 0.7–3.1)
Lymphs: 42 %
MCH: 33.1 pg — ABNORMAL HIGH (ref 26.6–33.0)
MCHC: 34.9 g/dL (ref 31.5–35.7)
MCV: 95 fL (ref 79–97)
Monocytes Absolute: 0.5 10*3/uL (ref 0.1–0.9)
Monocytes: 13 %
Neutrophils Absolute: 1.5 10*3/uL (ref 1.4–7.0)
Neutrophils: 40 %
Platelets: 177 10*3/uL (ref 150–450)
RBC: 4.14 x10E6/uL (ref 4.14–5.80)
RDW: 11.7 % (ref 11.6–15.4)
WBC: 3.7 10*3/uL (ref 3.4–10.8)

## 2020-05-18 LAB — LIPID PANEL
Chol/HDL Ratio: 2.3 ratio (ref 0.0–5.0)
Cholesterol, Total: 106 mg/dL (ref 100–199)
HDL: 46 mg/dL (ref >39)
LDL Chol Calc (NIH): 40 mg/dL (ref 0–99)
Triglycerides: 110 mg/dL (ref 0–149)
VLDL Cholesterol Cal: 20 mg/dL (ref 5–40)

## 2020-05-18 LAB — COMPREHENSIVE METABOLIC PANEL
ALT: 28 IU/L (ref 0–44)
AST: 20 IU/L (ref 0–40)
Albumin/Globulin Ratio: 1.8 (ref 1.2–2.2)
Albumin: 4.4 g/dL (ref 3.8–4.8)
Alkaline Phosphatase: 84 IU/L (ref 44–121)
BUN/Creatinine Ratio: 11 (ref 10–24)
BUN: 12 mg/dL (ref 8–27)
Bilirubin Total: 0.3 mg/dL (ref 0.0–1.2)
CO2: 26 mmol/L (ref 20–29)
Calcium: 9.3 mg/dL (ref 8.6–10.2)
Chloride: 101 mmol/L (ref 96–106)
Creatinine, Ser: 1.13 mg/dL (ref 0.76–1.27)
GFR calc Af Amer: 77 mL/min/{1.73_m2} (ref >59)
GFR calc non Af Amer: 66 mL/min/{1.73_m2} (ref >59)
Globulin, Total: 2.4 g/dL (ref 1.5–4.5)
Glucose: 142 mg/dL — ABNORMAL HIGH (ref 65–99)
Potassium: 4.4 mmol/L (ref 3.5–5.2)
Sodium: 140 mmol/L (ref 134–144)
Total Protein: 6.8 g/dL (ref 6.0–8.5)

## 2020-05-18 LAB — CARDIOVASCULAR RISK ASSESSMENT

## 2020-05-18 LAB — HEMOGLOBIN A1C
Est. average glucose Bld gHb Est-mCnc: 169 mg/dL
Hgb A1c MFr Bld: 7.5 % — ABNORMAL HIGH (ref 4.8–5.6)

## 2020-07-27 NOTE — Progress Notes (Signed)
Subjective:  Patient ID: Nathaniel Nicholson, male    DOB: 01-14-1951  Age: 70 y.o. MRN: [id number]  Chief Complaint  Patient presents with  . Hyperlipidemia  . Diabetes  . Hypertension    HPI Cholesterol- taking lipitor daily  GERD-Controlled with nexium  HTN- Takes losartan 100 mg once daily.  DM- Metformin 1000 mg one twice a day and Gabapentin 300 mg one twice a day. Sometimes feels like needs a third dose.  Sugars 110-130s.  Gout: controlled on allopurinol. Takes colchicine if flares.   ED: Testosterone 250 mg once every 2 weeks. Current Outpatient Medications on File Prior to Visit  Medication Sig Dispense Refill  . allopurinol (ZYLOPRIM) 100 MG tablet Take 1 tablet by mouth once daily 90 tablet 3  . atorvastatin (LIPITOR) 20 MG tablet Take 1 tablet (20 mg total) by mouth daily. 90 tablet 1  . colchicine 0.6 MG tablet Take 1 tablet by mouth twice daily 14 tablet 1  . esomeprazole (NEXIUM) 40 MG capsule Take 40 mg by mouth daily at 12 noon.    . gabapentin (NEURONTIN) 300 MG capsule Take 1 capsule (300 mg total) by mouth 2 (two) times daily. 180 capsule 1  . losartan (COZAAR) 100 MG tablet Take 1 tablet (100 mg total) by mouth daily. 90 tablet 0  . metFORMIN (GLUCOPHAGE) 1000 MG tablet Take 1 tablet (1,000 mg total) by mouth 2 (two) times daily with a meal. 180 tablet 0  . tadalafil (CIALIS) 10 MG tablet Take 1 tablet (10 mg total) by mouth daily. 30 tablet 3  . testosterone cypionate (DEPOTESTOSTERONE CYPIONATE) 200 MG/ML injection Inject 1.25 mLs (250 mg total) into the muscle every 14 (fourteen) days. 2 mL 5  . timolol (TIMOPTIC) 0.25 % ophthalmic solution 1 drop 2 (two) times daily.     No current facility-administered medications on file prior to visit.   Past Medical History:  Diagnosis Date  . Diabetes mellitus without complication (HCC)   . GERD (gastroesophageal reflux disease)   . Hyperlipemia   . Hypertension   . Nicotine dependence, cigarettes, uncomplicated    . Other male erectile dysfunction   . Peptic ulcer 2000  . Testicular hypofunction   . Vitamin D deficiency    History reviewed. No pertinent surgical history.  Family History  Problem Relation Age of Onset  . Cancer Father        lung  . Cancer Sister        lung  . Cancer Brother        throat  . Hypertension Other   . Diabetes Other    Social History   Socioeconomic History  . Marital status: Married    Spouse name: Not on file  . Number of children: Not on file  . Years of education: Not on file  . Highest education level: Not on file  Occupational History  . Occupation: Marine scientist  Tobacco Use  . Smoking status: Current Every Day Smoker    Packs/day: 0.50    Years: 35.00    Pack years: 17.50    Types: Cigarettes  . Smokeless tobacco: Never Used  . Tobacco comment: 1 ppd for > 30 years.  Substance and Sexual Activity  . Alcohol use: Yes    Alcohol/week: 5.0 standard drinks    Types: 5 Cans of beer per week    Comment: daily  . Drug use: No  . Sexual activity: Not on file  Other Topics Concern  . Not on file  Social History Narrative  . Not on file   Social Determinants of Health   Financial Resource Strain: Not on file  Food Insecurity: Not on file  Transportation Needs: Not on file  Physical Activity: Not on file  Stress: Not on file  Social Connections: Not on file    Review of Systems  Constitutional: Negative for chills, diaphoresis, fatigue and fever.  HENT: Negative for congestion, ear pain and sore throat.   Respiratory: Positive for wheezing (seems like it is in his nasal passages. Not in his lungs.). Negative for cough and shortness of breath.   Cardiovascular: Negative for chest pain and leg swelling.  Gastrointestinal: Negative for abdominal pain, constipation, diarrhea, nausea and vomiting.  Endocrine: Negative for polydipsia, polyphagia and polyuria.  Genitourinary: Negative for dysuria and urgency.  Musculoskeletal: Negative  for arthralgias and myalgias.  Neurological: Negative for dizziness and headaches.  Psychiatric/Behavioral: Negative for dysphoric mood. The patient is not nervous/anxious.      Objective:  BP 120/86   Pulse 84   Temp (!) 97.5 F (36.4 C)   Resp 16   Ht 6\' 3"  (1.905 m)   Wt 225 lb (102.1 kg)   BMI 28.12 kg/m   BP/Weight 07/31/2020 04/04/2020 01/19/2020  Systolic BP 120 130 120  Diastolic BP 86 78 76  Wt. (Lbs) 225 210.2 197.8  BMI 28.12 26.27 24.72    Physical Exam Vitals reviewed.  Constitutional:      Appearance: Normal appearance. He is normal weight.  Cardiovascular:     Rate and Rhythm: Normal rate and regular rhythm.     Heart sounds: No murmur heard.   Pulmonary:     Effort: Pulmonary effort is normal.     Breath sounds: Normal breath sounds.  Abdominal:     General: Abdomen is flat. Bowel sounds are normal.     Palpations: Abdomen is soft.     Tenderness: There is no abdominal tenderness.  Neurological:     Mental Status: He is alert and oriented to person, place, and time.  Psychiatric:        Mood and Affect: Mood normal.        Behavior: Behavior normal.     Diabetic Foot Exam - Simple   No data filed      Lab Results  Component Value Date   WBC 3.7 05/17/2020   HGB 13.7 05/17/2020   HCT 39.2 05/17/2020   PLT 177 05/17/2020   GLUCOSE 142 (H) 05/17/2020   CHOL 106 05/17/2020   TRIG 110 05/17/2020   HDL 46 05/17/2020   LDLCALC 40 05/17/2020   ALT 28 05/17/2020   AST 20 05/17/2020   NA 140 05/17/2020   K 4.4 05/17/2020   CL 101 05/17/2020   CREATININE 1.13 05/17/2020   BUN 12 05/17/2020   CO2 26 05/17/2020   TSH 1.640 01/19/2020   HGBA1C 7.5 (H) 05/17/2020   MICROALBUR 80 01/19/2020      Assessment & Plan:   1. Diabetic polyneuropathy associated with type 2 diabetes mellitus (HCC) Control: good Recommend check sugars fasting daily. Recommend check feet daily. Recommend annual eye exams. Medicines: no changes Continue to work  on eating a healthy diet and exercise.  Labs drawn today.   - CBC with Differential/Platelet - Hemoglobin A1c - POCT UA - Microalbumin  2. Mixed hyperlipidemia The current medical regimen is effective;  continue present plan and medications. Low fat diet and exercise recommended.  - Lipid panel  3. Essential hypertension, benign  Well controlled.  No changes to medicines.  Continue to work on eating a healthy diet and exercise.  Labs drawn today.  - Comprehensive metabolic panel  4. Hypogonadism, male Continue testosterone and cialis.   5. GOUT:  Continue allopurinol.    No orders of the defined types were placed in this encounter.   Orders Placed This Encounter  Procedures  . CBC with Differential/Platelet  . Comprehensive metabolic panel  . Hemoglobin A1c  . Lipid panel  . POCT UA - Microalbumin     Follow-up: No follow-ups on file.  An After Visit Summary was printed and given to the patient.  Blane Ohara, MD [facility] [phone]

## 2020-07-28 ENCOUNTER — Encounter: Payer: Self-pay | Admitting: Family Medicine

## 2020-07-31 ENCOUNTER — Ambulatory Visit (INDEPENDENT_AMBULATORY_CARE_PROVIDER_SITE_OTHER): Payer: Medicare HMO | Admitting: Family Medicine

## 2020-07-31 ENCOUNTER — Encounter: Payer: Self-pay | Admitting: Family Medicine

## 2020-07-31 ENCOUNTER — Other Ambulatory Visit: Payer: Self-pay

## 2020-07-31 VITALS — BP 120/86 | HR 84 | Temp 97.5°F | Resp 16 | Ht 75.0 in | Wt 225.0 lb

## 2020-07-31 DIAGNOSIS — E782 Mixed hyperlipidemia: Secondary | ICD-10-CM | POA: Diagnosis not present

## 2020-07-31 DIAGNOSIS — E291 Testicular hypofunction: Secondary | ICD-10-CM | POA: Diagnosis not present

## 2020-07-31 DIAGNOSIS — I10 Essential (primary) hypertension: Secondary | ICD-10-CM | POA: Diagnosis not present

## 2020-07-31 DIAGNOSIS — M1A0790 Idiopathic chronic gout, unspecified ankle and foot, without tophus (tophi): Secondary | ICD-10-CM

## 2020-07-31 DIAGNOSIS — R351 Nocturia: Secondary | ICD-10-CM

## 2020-07-31 DIAGNOSIS — N401 Enlarged prostate with lower urinary tract symptoms: Secondary | ICD-10-CM | POA: Diagnosis not present

## 2020-07-31 DIAGNOSIS — M1A0610 Idiopathic chronic gout, right knee, without tophus (tophi): Secondary | ICD-10-CM

## 2020-07-31 DIAGNOSIS — E298 Other testicular dysfunction: Secondary | ICD-10-CM

## 2020-07-31 DIAGNOSIS — E1142 Type 2 diabetes mellitus with diabetic polyneuropathy: Secondary | ICD-10-CM | POA: Diagnosis not present

## 2020-07-31 MED ORDER — TADALAFIL 10 MG PO TABS: 10.0000 mg | ORAL_TABLET | Freq: Every day | ORAL | 3 refills | Status: AC

## 2020-07-31 MED ORDER — ALLOPURINOL 100 MG PO TABS: 100.0000 mg | ORAL_TABLET | Freq: Every day | ORAL | 3 refills | Status: DC

## 2020-07-31 MED ORDER — TESTOSTERONE CYPIONATE 200 MG/ML IM SOLN
250.0000 mg | INTRAMUSCULAR | 5 refills | Status: DC
Start: 2020-07-31 — End: 2021-08-29

## 2020-07-31 MED ORDER — ATORVASTATIN CALCIUM 20 MG PO TABS: 20.0000 mg | ORAL_TABLET | Freq: Every day | ORAL | 1 refills | Status: DC

## 2020-07-31 NOTE — Patient Instructions (Signed)
Recommend continue to decrease smoking and quit.  Continue to eat healthy and exercise.  Please send cologuard back!

## 2020-08-01 ENCOUNTER — Telehealth: Payer: Self-pay

## 2020-08-01 LAB — HEMOGLOBIN A1C
Est. average glucose Bld gHb Est-mCnc: 160 mg/dL
Hgb A1c MFr Bld: 7.2 % — ABNORMAL HIGH (ref 4.8–5.6)

## 2020-08-01 LAB — COMPREHENSIVE METABOLIC PANEL
ALT: 40 IU/L (ref 0–44)
AST: 29 IU/L (ref 0–40)
Albumin/Globulin Ratio: 1.9 (ref 1.2–2.2)
Albumin: 4.4 g/dL (ref 3.8–4.8)
Alkaline Phosphatase: 89 IU/L (ref 44–121)
BUN/Creatinine Ratio: 12 (ref 10–24)
BUN: 13 mg/dL (ref 8–27)
Bilirubin Total: 0.5 mg/dL (ref 0.0–1.2)
CO2: 23 mmol/L (ref 20–29)
Calcium: 9.7 mg/dL (ref 8.6–10.2)
Chloride: 101 mmol/L (ref 96–106)
Creatinine, Ser: 1.11 mg/dL (ref 0.76–1.27)
Globulin, Total: 2.3 g/dL (ref 1.5–4.5)
Glucose: 160 mg/dL — ABNORMAL HIGH (ref 65–99)
Potassium: 4.7 mmol/L (ref 3.5–5.2)
Sodium: 140 mmol/L (ref 134–144)
Total Protein: 6.7 g/dL (ref 6.0–8.5)
eGFR: 72 mL/min/{1.73_m2} (ref >59)

## 2020-08-01 LAB — CARDIOVASCULAR RISK ASSESSMENT

## 2020-08-01 LAB — CBC WITH DIFFERENTIAL/PLATELET
Basophils Absolute: 0 10*3/uL (ref 0.0–0.2)
Basos: 0 %
EOS (ABSOLUTE): 0.1 10*3/uL (ref 0.0–0.4)
Eos: 4 %
Hematocrit: 39.7 % (ref 37.5–51.0)
Hemoglobin: 13.5 g/dL (ref 13.0–17.7)
Immature Grans (Abs): 0 10*3/uL (ref 0.0–0.1)
Immature Granulocytes: 0 %
Lymphocytes Absolute: 1.4 10*3/uL (ref 0.7–3.1)
Lymphs: 39 %
MCH: 33.2 pg — ABNORMAL HIGH (ref 26.6–33.0)
MCHC: 34 g/dL (ref 31.5–35.7)
MCV: 98 fL — ABNORMAL HIGH (ref 79–97)
Monocytes Absolute: 0.3 10*3/uL (ref 0.1–0.9)
Monocytes: 10 %
Neutrophils Absolute: 1.6 10*3/uL (ref 1.4–7.0)
Neutrophils: 47 %
Platelets: 182 10*3/uL (ref 150–450)
RBC: 4.07 x10E6/uL — ABNORMAL LOW (ref 4.14–5.80)
RDW: 13 % (ref 11.6–15.4)
WBC: 3.5 10*3/uL (ref 3.4–10.8)

## 2020-08-01 LAB — LIPID PANEL
Chol/HDL Ratio: 4.4 ratio (ref 0.0–5.0)
Cholesterol, Total: 171 mg/dL (ref 100–199)
HDL: 39 mg/dL — ABNORMAL LOW (ref >39)
LDL Chol Calc (NIH): 99 mg/dL (ref 0–99)
Triglycerides: 188 mg/dL — ABNORMAL HIGH (ref 0–149)
VLDL Cholesterol Cal: 33 mg/dL (ref 5–40)

## 2020-08-01 NOTE — Telephone Encounter (Signed)
PA originally denied via covermymeds for testosterone, appeal submitted via covermymeds, call and spoke with [employer] representative who approved testosterone through 04/14/2021.

## 2020-08-29 ENCOUNTER — Other Ambulatory Visit: Payer: Self-pay | Admitting: Physician Assistant

## 2020-09-21 ENCOUNTER — Other Ambulatory Visit: Payer: Self-pay | Admitting: Family Medicine

## 2020-09-21 DIAGNOSIS — E1142 Type 2 diabetes mellitus with diabetic polyneuropathy: Secondary | ICD-10-CM

## 2020-09-29 DIAGNOSIS — H40013 Open angle with borderline findings, low risk, bilateral: Secondary | ICD-10-CM | POA: Diagnosis not present

## 2020-10-18 ENCOUNTER — Other Ambulatory Visit: Payer: Self-pay | Admitting: Family Medicine

## 2020-10-18 DIAGNOSIS — E1142 Type 2 diabetes mellitus with diabetic polyneuropathy: Secondary | ICD-10-CM

## 2020-10-18 DIAGNOSIS — I10 Essential (primary) hypertension: Secondary | ICD-10-CM

## 2020-11-14 ENCOUNTER — Ambulatory Visit (INDEPENDENT_AMBULATORY_CARE_PROVIDER_SITE_OTHER): Payer: Medicare HMO | Admitting: Family Medicine

## 2020-11-14 ENCOUNTER — Other Ambulatory Visit: Payer: Self-pay

## 2020-11-14 ENCOUNTER — Encounter: Payer: Self-pay | Admitting: Family Medicine

## 2020-11-14 VITALS — BP 130/76 | HR 84 | Temp 97.3°F | Resp 18 | Ht 75.0 in | Wt 209.6 lb

## 2020-11-14 DIAGNOSIS — M1A0790 Idiopathic chronic gout, unspecified ankle and foot, without tophus (tophi): Secondary | ICD-10-CM | POA: Diagnosis not present

## 2020-11-14 DIAGNOSIS — I10 Essential (primary) hypertension: Secondary | ICD-10-CM

## 2020-11-14 DIAGNOSIS — R21 Rash and other nonspecific skin eruption: Secondary | ICD-10-CM | POA: Diagnosis not present

## 2020-11-14 DIAGNOSIS — Z6826 Body mass index (BMI) 26.0-26.9, adult: Secondary | ICD-10-CM | POA: Diagnosis not present

## 2020-11-14 DIAGNOSIS — E1142 Type 2 diabetes mellitus with diabetic polyneuropathy: Secondary | ICD-10-CM

## 2020-11-14 DIAGNOSIS — F17218 Nicotine dependence, cigarettes, with other nicotine-induced disorders: Secondary | ICD-10-CM

## 2020-11-14 DIAGNOSIS — R69 Illness, unspecified: Secondary | ICD-10-CM | POA: Diagnosis not present

## 2020-11-14 DIAGNOSIS — E782 Mixed hyperlipidemia: Secondary | ICD-10-CM | POA: Diagnosis not present

## 2020-11-14 DIAGNOSIS — E291 Testicular hypofunction: Secondary | ICD-10-CM | POA: Diagnosis not present

## 2020-11-14 NOTE — Progress Notes (Deleted)
Subjective:  Patient ID: Nathaniel Nicholson, male    DOB: 1950-04-22  Age: 70 y.o. MRN: [id number]  Chief Complaint  Patient presents with   Diabetes    HPI   Current Outpatient Medications on File Prior to Visit  Medication Sig Dispense Refill   allopurinol (ZYLOPRIM) 100 MG tablet Take 1 tablet (100 mg total) by mouth daily. 90 tablet 3   atorvastatin (LIPITOR) 20 MG tablet Take 1 tablet (20 mg total) by mouth daily. 90 tablet 1   colchicine 0.6 MG tablet Take 1 tablet by mouth twice daily 14 tablet 1   esomeprazole (NEXIUM) 40 MG capsule Take 40 mg by mouth daily at 12 noon.     gabapentin (NEURONTIN) 300 MG capsule Take 1 capsule by mouth twice daily 180 capsule 0   losartan (COZAAR) 100 MG tablet Take 1 tablet by mouth once daily 90 tablet 0   metFORMIN (GLUCOPHAGE) 1000 MG tablet TAKE 1 TABLET BY MOUTH TWICE DAILY WITH MEALS 180 tablet 0   tadalafil (CIALIS) 10 MG tablet Take 1 tablet (10 mg total) by mouth daily. 30 tablet 3   testosterone cypionate (DEPOTESTOSTERONE CYPIONATE) 200 MG/ML injection Inject 1.25 mLs (250 mg total) into the muscle every 14 (fourteen) days. 3 mL 5   timolol (TIMOPTIC) 0.25 % ophthalmic solution 1 drop 2 (two) times daily.     No current facility-administered medications on file prior to visit.   Past Medical History:  Diagnosis Date   Diabetes mellitus without complication (HCC)    GERD (gastroesophageal reflux disease)    Hyperlipemia    Hypertension    Nicotine dependence, cigarettes, uncomplicated    Other male erectile dysfunction    Peptic ulcer 2000   Testicular hypofunction    Vitamin D deficiency    No past surgical history on file.  Family History  Problem Relation Age of Onset   Cancer Father        lung   Cancer Sister        lung   Cancer Brother        throat   Hypertension Other    Diabetes Other    Social History   Socioeconomic History   Marital status: Married    Spouse name: Not on file   Number of children: Not  on file   Years of education: Not on file   Highest education level: Not on file  Occupational History   Occupation: Marine scientist  Tobacco Use   Smoking status: Every Day    Packs/day: 0.50    Years: 35.00    Pack years: 17.50    Types: Cigarettes   Smokeless tobacco: Never   Tobacco comments:    1 ppd for > 30 years.  Substance and Sexual Activity   Alcohol use: Yes    Alcohol/week: 5.0 standard drinks    Types: 5 Cans of beer per week    Comment: daily   Drug use: No   Sexual activity: Not on file  Other Topics Concern   Not on file  Social History Narrative   Not on file   Social Determinants of Health   Financial Resource Strain: Not on file  Food Insecurity: Not on file  Transportation Needs: Not on file  Physical Activity: Not on file  Stress: Not on file  Social Connections: Not on file    Review of Systems  Constitutional:  Negative for chills and fever.  HENT:  Negative for congestion, rhinorrhea and sore throat.  Respiratory:  Negative for cough and shortness of breath.   Cardiovascular:  Negative for chest pain and palpitations.  Gastrointestinal:  Negative for abdominal pain, constipation, diarrhea, nausea and vomiting.  Genitourinary:  Negative for dysuria and urgency.  Musculoskeletal:  Negative for arthralgias, back pain and myalgias.  Skin:  Positive for rash.       Skin lesion left abdomen  Neurological:  Negative for dizziness and headaches.  Psychiatric/Behavioral:  Negative for dysphoric mood. The patient is not nervous/anxious.     Objective:  BP 130/76   Pulse 84   Temp (!) 97.3 F (36.3 C)   Resp 18   Ht 6\' 3"  (1.905 m)   Wt 209 lb 9.6 oz (95.1 kg)   BMI 26.20 kg/m   BP/Weight 11/14/2020 07/31/2020 04/04/2020  Systolic BP 130 120 130  Diastolic BP 76 86 78  Wt. (Lbs) 209.6 225 210.2  BMI 26.2 28.12 26.27    Physical Exam  Diabetic Foot Exam - Simple   No data filed      Lab Results  Component Value Date   WBC 3.5  07/31/2020   HGB 13.5 07/31/2020   HCT 39.7 07/31/2020   PLT 182 07/31/2020   GLUCOSE 160 (H) 07/31/2020   CHOL 171 07/31/2020   TRIG 188 (H) 07/31/2020   HDL 39 (L) 07/31/2020   LDLCALC 99 07/31/2020   ALT 40 07/31/2020   AST 29 07/31/2020   NA 140 07/31/2020   K 4.7 07/31/2020   CL 101 07/31/2020   CREATININE 1.11 07/31/2020   BUN 13 07/31/2020   CO2 23 07/31/2020   TSH 1.640 01/19/2020   HGBA1C 7.2 (H) 07/31/2020   MICROALBUR 80 01/19/2020      Assessment & Plan:   There are no diagnoses linked to this encounter.   No orders of the defined types were placed in this encounter.   No orders of the defined types were placed in this encounter.    Follow-up: No follow-ups on file.  An After Visit Summary was printed and given to the patient.  03/20/2020, MD [facility] [phone]

## 2020-11-14 NOTE — Progress Notes (Signed)
Established Patient Office Visit  Subjective:  Patient ID: Nathaniel Nicholson, male    DOB: 17-Jan-1951  Age: 70 y.o. MRN: [id number]  CC:  Chief Complaint  Patient presents with   Diabetes    HPI [name] presents for follow up for diabetes. He also had complaints of a rash on his abdomen that itches and started Saturday July 30th. He is not aware of being bitten by anything and has applied Triamcinolone cream with improvement.   Diabetic polyneuropathy associated with type 2 diabetes mellitus ([place]) Patient taking Metformin 1000 mg po bid and trying to follow a healthy diet. Blood sugars ranging from 160-228. Checks feet. Gabapentin for neuropathy.  Not exercising.   Mixed hyperlipidemia Taking Lipitor 20 mg po daily and attempting to follow a healthy diet.   Essential hypertension, benign Taking Cozaar 100 mg po daily.  Hypogonadism, male Taking Testosterone Cypionate 1.25 ml every 14 days.  Idiopathic chronic gout of foot without tophus, unspecified laterality Taking Allopurinol 100 mg po daily and Colchicine 0.6 mg po prn. He did report a gout flare-up one week ago in left great toe toe but had improvement in symptoms after taking Colchicine.   Cigarette nicotine dependence with other nicotine-induced disorder Pt continues to smoke 1/2 to 1 ppd. Pt says he needs to quit, but not sure he really wants to quit. Yet.    Past Medical History:  Diagnosis Date   Diabetes mellitus without complication (HCC)    GERD (gastroesophageal reflux disease)    Hyperlipemia    Hypertension    Nicotine dependence, cigarettes, uncomplicated    Other male erectile dysfunction    Peptic ulcer 2000   Testicular hypofunction    Vitamin D deficiency     History reviewed. No pertinent surgical history.  Family History  Problem Relation Age of Onset   Cancer Father        lung   Cancer Sister        lung   Cancer Brother        throat   Hypertension Other    Diabetes Other      Social History   Socioeconomic History   Marital status: Married    Spouse name: Not on file   Number of children: Not on file   Years of education: Not on file   Highest education level: Not on file  Occupational History   Occupation: Nurse, learning disability  Tobacco Use   Smoking status: Every Day    Packs/day: 1.00    Years: 35.00    Pack years: 35.00    Types: Cigarettes   Smokeless tobacco: Never   Tobacco comments:    1 ppd for > 30 years.  Substance and Sexual Activity   Alcohol use: Yes    Alcohol/week: 28.0 standard drinks    Types: 28 Cans of beer per week    Comment: 4/daily   Drug use: No   Sexual activity: Not on file  Other Topics Concern   Not on file  Social History Narrative   Not on file   Social Determinants of Health   Financial Resource Strain: Not on file  Food Insecurity: Not on file  Transportation Needs: Not on file  Physical Activity: Not on file  Stress: Not on file  Social Connections: Not on file  Intimate Partner Violence: Not on file    Outpatient Medications Prior to Visit  Medication Sig Dispense Refill   allopurinol (ZYLOPRIM) 100 MG tablet Take 1 tablet (100 mg  total) by mouth daily. 90 tablet 3   atorvastatin (LIPITOR) 20 MG tablet Take 1 tablet (20 mg total) by mouth daily. 90 tablet 1   colchicine 0.6 MG tablet Take 1 tablet by mouth twice daily 14 tablet 1   esomeprazole (NEXIUM) 40 MG capsule Take 40 mg by mouth daily at 12 noon.     gabapentin (NEURONTIN) 300 MG capsule Take 1 capsule by mouth twice daily 180 capsule 0   losartan (COZAAR) 100 MG tablet Take 1 tablet by mouth once daily 90 tablet 0   metFORMIN (GLUCOPHAGE) 1000 MG tablet TAKE 1 TABLET BY MOUTH TWICE DAILY WITH MEALS 180 tablet 0   tadalafil (CIALIS) 10 MG tablet Take 1 tablet (10 mg total) by mouth daily. 30 tablet 3   testosterone cypionate (DEPOTESTOSTERONE CYPIONATE) 200 MG/ML injection Inject 1.25 mLs (250 mg total) into the muscle every 14 (fourteen)  days. 3 mL 5   timolol (TIMOPTIC) 0.25 % ophthalmic solution 1 drop 2 (two) times daily.     No facility-administered medications prior to visit.    No Known Allergies  ROS Review of Systems  Constitutional: Negative.  Negative for appetite change, chills, fatigue and fever.  HENT: Negative.  Negative for congestion, ear pain and sore throat.   Eyes: Negative.  Negative for redness and itching.  Respiratory: Negative.  Negative for cough and shortness of breath.   Cardiovascular:  Negative for chest pain, palpitations and leg swelling.  Gastrointestinal: Negative.  Negative for abdominal distention, abdominal pain, constipation, diarrhea, nausea and vomiting.  Endocrine: Negative for cold intolerance, heat intolerance, polydipsia, polyphagia and polyuria.  Genitourinary: Negative.  Negative for difficulty urinating and frequency.  Musculoskeletal: Negative.  Negative for arthralgias and joint swelling.  Skin:  Positive for rash (reports rash on abdomen).  Neurological: Negative.  Negative for weakness and headaches.  Psychiatric/Behavioral: Negative.  Negative for agitation. The patient is not nervous/anxious.      Objective:    Physical Exam Vitals reviewed.  Constitutional:      Appearance: Normal appearance.  HENT:     Head: Normocephalic.     Right Ear: Tympanic membrane and external ear normal.     Left Ear: Tympanic membrane and external ear normal.     Nose: Nose normal.     Mouth/Throat:     Mouth: Mucous membranes are moist.  Neck:     Vascular: No carotid bruit.  Cardiovascular:     Rate and Rhythm: Normal rate and regular rhythm.     Pulses: Normal pulses.     Heart sounds: Normal heart sounds.  Pulmonary:     Effort: Pulmonary effort is normal.     Breath sounds: Normal breath sounds.  Abdominal:     General: Abdomen is flat. Bowel sounds are normal.     Palpations: Abdomen is soft.  Musculoskeletal:        General: Normal range of motion.  Skin:     General: Skin is warm and dry.     Findings: Rash (red areas around umbilicus) present. Rash is macular.       Neurological:     General: No focal deficit present.     Mental Status: He is alert and oriented to person, place, and time.  Psychiatric:        Mood and Affect: Mood normal.        Behavior: Behavior normal.   Diabetic Foot Exam - Simple   Simple Foot Form Diabetic Foot exam was performed with  the following findings: Yes 11/14/2020  9:52 AM  Visual Inspection No deformities, no ulcerations, no other skin breakdown bilaterally: Yes Sensation Testing Intact to touch and monofilament testing bilaterally: Yes Pulse Check Posterior Tibialis and Dorsalis pulse intact bilaterally: Yes Comments     BP 130/76   Pulse 84   Temp (!) 97.3 F (36.3 C)   Resp 18   Ht _0  (1.905 m)   Wt 209 lb 9.6 oz (95.1 kg)   BMI 26.20 kg/m  Wt Readings from Last 3 Encounters:  11/14/20 209 lb 9.6 oz (95.1 kg)  07/31/20 225 lb (102.1 kg)  04/04/20 210 lb 3.2 oz (95.3 kg)     Health Maintenance Due  Topic Date Due   Hepatitis C Screening  Never done   TETANUS/TDAP  Never done   Fecal DNA (Cologuard)  Never done   Zoster Vaccines- Shingrix (1 of 2) Never done   COVID-19 Vaccine (4 - Booster for Pfizer series) 05/29/2020   INFLUENZA VACCINE  11/13/2020    There are no preventive care reminders to display for this patient.  Lab Results  Component Value Date   TSH 1.640 01/19/2020   Lab Results  Component Value Date   WBC 3.2 (L) 11/14/2020   HGB 15.8 11/14/2020   HCT 49.3 11/14/2020   MCV 99 (H) 11/14/2020   PLT 192 11/14/2020   Lab Results  Component Value Date   NA 139 11/14/2020   K 4.9 11/14/2020   CO2 24 11/14/2020   GLUCOSE 178 (H) 11/14/2020   BUN 13 11/14/2020   CREATININE 1.07 11/14/2020   BILITOT 0.6 11/14/2020   ALKPHOS 99 11/14/2020   AST 17 11/14/2020   ALT 21 11/14/2020   PROT 7.0 11/14/2020   ALBUMIN 4.6 11/14/2020   CALCIUM 10.1 11/14/2020    ANIONGAP 11 01/12/2018   EGFR 76 11/14/2020   Lab Results  Component Value Date   CHOL 98 (L) 11/14/2020   Lab Results  Component Value Date   HDL 42 11/14/2020   Lab Results  Component Value Date   LDLCALC 36 11/14/2020   Lab Results  Component Value Date   TRIG 111 11/14/2020   Lab Results  Component Value Date   CHOLHDL 2.3 11/14/2020   Lab Results  Component Value Date   HGBA1C 9.4 (H) 11/14/2020      Assessment & Plan:   1. Diabetic polyneuropathy associated with type 2 diabetes mellitus ([place]) Control: Worsened. Recommend check sugars fasting daily. Recommend check feet daily. Recommend annual eye exams. Medicines: metformin. Given jardiance samples and tresiba samples. Continue to work on eating a healthy diet and exercise.  Labs drawn today.   - Hemoglobin A1c - POCT UA - Microalbumin - Continue to eat healthy diet - Continue Metformin  2. Mixed hyperlipidemia The current medical regimen is effective;  continue present plan and medications. - Lipid Panel - Continue to eat a healthy diet - Continue Atorvastatin  3. Essential hypertension, benign Well controlled.  No changes to medicines.  Continue to work on eating a healthy diet and exercise.  Labs drawn today.  - CBC with Differential - Comprehensive metabolic panel - Continue Cozaar  4. Hypogonadism, male - Continue Testosterone and Tadalafil  5. Idiopathic chronic gout of foot without tophus, unspecified laterality - Continue Allopurinol daily and Colchicine prn  6. Cigarette nicotine dependence with other nicotine-induced disorder  Tobacco cessation recommended.   7. BMI 27 Recommend continue to work on eating healthy diet and exercise.  8. Rash: looks like some type of bug bites. No pain. No itching.   Follow-up: Return in about 3 months (around 02/14/2021) for fasting.    Rochel Brome, MD

## 2020-11-15 LAB — COMPREHENSIVE METABOLIC PANEL
ALT: 21 IU/L (ref 0–44)
AST: 17 IU/L (ref 0–40)
Albumin/Globulin Ratio: 1.9 (ref 1.2–2.2)
Albumin: 4.6 g/dL (ref 3.8–4.8)
Alkaline Phosphatase: 99 IU/L (ref 44–121)
BUN/Creatinine Ratio: 12 (ref 10–24)
BUN: 13 mg/dL (ref 8–27)
Bilirubin Total: 0.6 mg/dL (ref 0.0–1.2)
CO2: 24 mmol/L (ref 20–29)
Calcium: 10.1 mg/dL (ref 8.6–10.2)
Chloride: 100 mmol/L (ref 96–106)
Creatinine, Ser: 1.07 mg/dL (ref 0.76–1.27)
Globulin, Total: 2.4 g/dL (ref 1.5–4.5)
Glucose: 178 mg/dL — ABNORMAL HIGH (ref 65–99)
Potassium: 4.9 mmol/L (ref 3.5–5.2)
Sodium: 139 mmol/L (ref 134–144)
Total Protein: 7 g/dL (ref 6.0–8.5)
eGFR: 76 mL/min/{1.73_m2} (ref >59)

## 2020-11-15 LAB — CBC WITH DIFFERENTIAL/PLATELET
Basophils Absolute: 0 10*3/uL (ref 0.0–0.2)
Basos: 0 %
EOS (ABSOLUTE): 0.1 10*3/uL (ref 0.0–0.4)
Eos: 3 %
Hematocrit: 49.3 % (ref 37.5–51.0)
Hemoglobin: 15.8 g/dL (ref 13.0–17.7)
Immature Grans (Abs): 0 10*3/uL (ref 0.0–0.1)
Immature Granulocytes: 0 %
Lymphocytes Absolute: 1.3 10*3/uL (ref 0.7–3.1)
Lymphs: 40 %
MCH: 31.7 pg (ref 26.6–33.0)
MCHC: 32 g/dL (ref 31.5–35.7)
MCV: 99 fL — ABNORMAL HIGH (ref 79–97)
Monocytes Absolute: 0.3 10*3/uL (ref 0.1–0.9)
Monocytes: 11 %
Neutrophils Absolute: 1.5 10*3/uL (ref 1.4–7.0)
Neutrophils: 46 %
Platelets: 192 10*3/uL (ref 150–450)
RBC: 4.99 x10E6/uL (ref 4.14–5.80)
RDW: 12 % (ref 11.6–15.4)
WBC: 3.2 10*3/uL — ABNORMAL LOW (ref 3.4–10.8)

## 2020-11-15 LAB — HEMOGLOBIN A1C
Est. average glucose Bld gHb Est-mCnc: 223 mg/dL
Hgb A1c MFr Bld: 9.4 % — ABNORMAL HIGH (ref 4.8–5.6)

## 2020-11-15 LAB — LIPID PANEL
Chol/HDL Ratio: 2.3 ratio (ref 0.0–5.0)
Cholesterol, Total: 98 mg/dL — ABNORMAL LOW (ref 100–199)
HDL: 42 mg/dL (ref >39)
LDL Chol Calc (NIH): 36 mg/dL (ref 0–99)
Triglycerides: 111 mg/dL (ref 0–149)
VLDL Cholesterol Cal: 20 mg/dL (ref 5–40)

## 2020-11-15 LAB — CARDIOVASCULAR RISK ASSESSMENT

## 2020-11-21 ENCOUNTER — Encounter: Payer: Self-pay | Admitting: Family Medicine

## 2020-11-26 ENCOUNTER — Other Ambulatory Visit: Payer: Self-pay | Admitting: Family Medicine

## 2020-11-27 ENCOUNTER — Other Ambulatory Visit: Payer: Self-pay

## 2020-11-27 MED ORDER — METFORMIN HCL 1000 MG PO TABS: 1000.0000 mg | ORAL_TABLET | Freq: Two times a day (BID) | ORAL | 0 refills | Status: DC

## 2020-11-27 NOTE — Telephone Encounter (Signed)
[name], wife, calling for pt requesting metformin and jardiance sent to pharmacy. States pt would also like to pick up samples of jardiance if possible.   Pharmacy: [facility] in [place].   Will pend metformin.

## 2020-12-14 ENCOUNTER — Other Ambulatory Visit: Payer: Self-pay | Admitting: Family Medicine

## 2020-12-14 DIAGNOSIS — E1142 Type 2 diabetes mellitus with diabetic polyneuropathy: Secondary | ICD-10-CM

## 2020-12-16 ENCOUNTER — Ambulatory Visit (INDEPENDENT_AMBULATORY_CARE_PROVIDER_SITE_OTHER): Payer: Medicare HMO

## 2020-12-16 DIAGNOSIS — Z0000 Encounter for general adult medical examination without abnormal findings: Secondary | ICD-10-CM

## 2020-12-16 NOTE — Progress Notes (Signed)
Subjective:   Nathaniel Nicholson is a 70 y.o. male who presents for Medicare Annual/Subsequent preventive examination.  Review of Systems    I connected with  Nathaniel Nicholson on 12/16/20 by an audio only telemedicine application and verified that I am speaking with the correct Nathaniel Nicholson using two identifiers.   I discussed the limitations, risks, security and privacy concerns of performing an evaluation and management service by telephone and the availability of in Nathaniel Nicholson appointments. I also discussed with the patient that there may be a patient responsible charge related to this service. The patient expressed understanding and verbally consented to this telephonic visit.  Location of Patient: Home Location of Provider: Office  List any persons and their role that are participating in the visit with the patient.  Nathaniel Nicholson and Nathaniel Nicholson, CMA       Objective:    There were no vitals filed for this visit. There is no height or weight on file to calculate BMI.  Advanced Directives 09/09/2019 01/12/2018 01/07/2017 08/06/2016  Does Patient Have a Medical Advance Directive? No No No No  Would patient like information on creating a medical advance directive? Yes (MAU/Ambulatory/Procedural Areas - Information given) - - -    Current Medications (verified) Outpatient Encounter Medications as of 12/16/2020  Medication Sig   allopurinol (ZYLOPRIM) 100 MG tablet Take 1 tablet (100 mg total) by mouth daily.   atorvastatin (LIPITOR) 20 MG tablet Take 1 tablet (20 mg total) by mouth daily.   colchicine 0.6 MG tablet Take 1 tablet by mouth twice daily   esomeprazole (NEXIUM) 40 MG capsule Take 40 mg by mouth daily at 12 noon.   gabapentin (NEURONTIN) 300 MG capsule Take 1 capsule by mouth twice daily   losartan (COZAAR) 100 MG tablet Take 1 tablet by mouth once daily   metFORMIN (GLUCOPHAGE) 1000 MG tablet Take 1 tablet (1,000 mg total) by mouth 2 (two) times daily with a meal.   tadalafil (CIALIS)  10 MG tablet Take 1 tablet (10 mg total) by mouth daily.   testosterone cypionate (DEPOTESTOSTERONE CYPIONATE) 200 MG/ML injection Inject 1.25 mLs (250 mg total) into the muscle every 14 (fourteen) days.   timolol (TIMOPTIC) 0.25 % ophthalmic solution 1 drop 2 (two) times daily.   No facility-administered encounter medications on file as of 12/16/2020.    Allergies (verified) Patient has no known allergies.   History: Past Medical History:  Diagnosis Date   Diabetes mellitus without complication (HCC)    GERD (gastroesophageal reflux disease)    Hyperlipemia    Hypertension    Nicotine dependence, cigarettes, uncomplicated    Other male erectile dysfunction    Peptic ulcer 2000   Testicular hypofunction    Vitamin D deficiency    History reviewed. No pertinent surgical history. Family History  Problem Relation Age of Onset   Cancer Father        lung   Cancer Sister        lung   Cancer Brother        throat   Hypertension Other    Diabetes Other    Social History   Socioeconomic History   Marital status: Married    Spouse name: Not on file   Number of children: Not on file   Years of education: Not on file   Highest education level: Not on file  Occupational History   Occupation: Marine scientist  Tobacco Use   Smoking status: Every Day    Packs/day: 1.00  Years: 35.00    Pack years: 35.00    Types: Cigarettes   Smokeless tobacco: Never   Tobacco comments:    1 ppd for > 30 years.  Substance and Sexual Activity   Alcohol use: Yes    Alcohol/week: 28.0 standard drinks    Types: 28 Cans of beer per week    Comment: 4/daily   Drug use: No   Sexual activity: Not on file  Other Topics Concern   Not on file  Social History Narrative   Not on file   Social Determinants of Health   Financial Resource Strain: Not on file  Food Insecurity: Not on file  Transportation Needs: Not on file  Physical Activity: Not on file  Stress: Not on file  Social  Connections: Not on file    Tobacco Counseling Ready to quit: Not Answered Counseling given: Not Answered Tobacco comments: 1 ppd for > 30 years.   Clinical Intake:  Pre-visit preparation completed: Yes  Pain : No/denies pain     Nutritional Risks: None Diabetes: Yes     Diabetic?yes  Interpreter Needed?: No      Activities of Daily Living In your present state of health, do you have any difficulty performing the following activities: 12/16/2020  Hearing? N  Vision? N  Difficulty concentrating or making decisions? N  Walking or climbing stairs? N  Dressing or bathing? N  Doing errands, shopping? N  Some recent data might be hidden    Patient Care Team: CoxFritzi Mandes, Kirsten, MD as PCP - General (Family Medicine)  Indicate any recent Medical Services you may have received from other than [facility] providers in the past year (date may be approximate).     Assessment:   This is a routine wellness examination for Nathaniel Nicholson.  Hearing/Vision screen No results found.  Dietary issues and exercise activities discussed:     Goals Addressed   None   Depression Screen PHQ 2/9 Scores 12/16/2020 04/04/2020 09/09/2019 09/03/2019  PHQ - 2 Score 0 0 0 0  PHQ- 9 Score 0 - - -    Fall Risk Fall Risk  12/16/2020 04/04/2020 09/09/2019 09/03/2019  Falls in the past year? 0 0 0 0  Number falls in past yr: 0 0 0 0  Injury with Fall? 0 0 0 -  Risk for fall due to : No Fall Risks No Fall Risks No Fall Risks -  Follow up Falls evaluation completed Falls evaluation completed;Falls prevention discussed Falls evaluation completed;Falls prevention discussed -    FALL RISK PREVENTION PERTAINING TO THE HOME:  Any stairs in or around the home? No  If so, are there any without handrails? No  Home free of loose throw rugs in walkways, pet beds, electrical cords, etc? Yes  Adequate lighting in your home to reduce risk of falls? Yes   ASSISTIVE DEVICES UTILIZED TO PREVENT FALLS:  Life alert? No  Use  of a cane, walker or w/c? No  Grab bars in the bathroom? No  Shower chair or bench in shower? Yes  Elevated toilet seat or a handicapped toilet? No   TIMED UP AND GO:  Was the test performed? No .  Length of time to ambulate 10 feet: n/a sec.     Cognitive Function:     6CIT Screen 12/16/2020 09/09/2019  What Year? 0 points 0 points  What month? 0 points 0 points  What time? 0 points 0 points  Count back from 20 0 points 0 points  Months in  reverse 0 points 0 points  Repeat phrase 2 points 0 points  Total Score 2 0    Immunizations Immunization History  Administered Date(s) Administered   Fluad Quad(high Dose 65+) 01/19/2020   PFIZER(Purple Top)SARS-COV-2 Vaccination 06/26/2019, 07/17/2019, 01/27/2020   Pneumococcal Conjugate-13 03/19/2018   Pneumococcal Polysaccharide-23 02/21/2017    TDAP status: Due, Education has been provided regarding the importance of this vaccine. Advised may receive this vaccine at local pharmacy or Health Dept. Aware to provide a copy of the vaccination record if obtained from local pharmacy or Health Dept. Verbalized acceptance and understanding.  Flu Vaccine status: Due, Education has been provided regarding the importance of this vaccine. Advised may receive this vaccine at local pharmacy or Health Dept. Aware to provide a copy of the vaccination record if obtained from local pharmacy or Health Dept. Verbalized acceptance and understanding.  Pneumococcal vaccine status: Up to date  Covid-19 vaccine status: Completed vaccines  Qualifies for Shingles Vaccine? Yes   Zostavax completed No   Shingrix Completed?: No.    Education has been provided regarding the importance of this vaccine. Patient has been advised to call insurance company to determine out of pocket expense if they have not yet received this vaccine. Advised may also receive vaccine at local pharmacy or Health Dept. Verbalized acceptance and understanding.  Screening Tests Health  Maintenance  Topic Date Due   Hepatitis C Screening  Never done   TETANUS/TDAP  Never done   Fecal DNA (Cologuard)  Never done   Zoster Vaccines- Shingrix (1 of 2) Never done   COVID-19 Vaccine (4 - Booster for Pfizer series) 05/29/2020   INFLUENZA VACCINE  11/13/2020   OPHTHALMOLOGY EXAM  03/27/2021   HEMOGLOBIN A1C  05/17/2021   FOOT EXAM  11/14/2021   PNA vac Low Risk Adult (2 of 2 - PPSV23) 02/21/2022   HPV VACCINES  Aged Out    Health Maintenance  Health Maintenance Due  Topic Date Due   Hepatitis C Screening  Never done   TETANUS/TDAP  Never done   Fecal DNA (Cologuard)  Never done   Zoster Vaccines- Shingrix (1 of 2) Never done   COVID-19 Vaccine (4 - Booster for Pfizer series) 05/29/2020   INFLUENZA VACCINE  11/13/2020    Colorectal cancer screening: Type of screening: Cologuard. Completed not competed. Repeat every not completed years  Lung Cancer Screening: (Low Dose CT Chest recommended if Age 71-80 years, 30 pack-year currently smoking OR have quit w/in 15years.) does not qualify.   Lung Cancer Screening Referral: n/a  Additional Screening:  Hepatitis C Screening: does qualify; Completed n/a  Vision Screening: Recommended annual ophthalmology exams for early detection of glaucoma and other disorders of the eye. Is the patient up to date with their annual eye exam?  Yes  Who is the provider or what is the name of the office in which the patient attends annual eye exams? Dr. Alice Reichert If pt is not established with a provider, would they like to be referred to a provider to establish care? No .   Dental Screening: Recommended annual dental exams for proper oral hygiene  Community Resource Referral / Chronic Care Management: CRR required this visit?  No   CCM required this visit?  No      Plan:     I have personally reviewed and noted the following in the patient's chart:   Medical and social history Use of alcohol, tobacco or illicit drugs  Current  medications and supplements including opioid prescriptions.  Patient is not currently taking opioid prescriptions. Functional ability and status Nutritional status Physical activity Advanced directives List of other physicians Hospitalizations, surgeries, and ER visits in previous 12 months Vitals Screenings to include cognitive, depression, and falls Referrals and appointments  In addition, I have reviewed and discussed with patient certain preventive protocols, quality metrics, and best practice recommendations. A written personalized care plan for preventive services as well as general preventive health recommendations were provided to patient.     Eulis Canner Jensine Luz, CMA   12/16/2020   Nurse Notes: Non Face to Face 40 min visit   Mr. Ferris , Thank you for taking time to come for your Medicare Wellness Visit. I appreciate your ongoing commitment to your health goals. Please review the following plan we discussed and let me know if I can assist you in the future.   These are the goals we discussed:  Goals      Complete Cologuard     HEMOGLOBIN A1C < 7     Obtain Annual Eye (retinal)  Exam      Quit Smoking        This is a list of the screening recommended for you and due dates:  Health Maintenance  Topic Date Due   Hepatitis C Screening: USPSTF Recommendation to screen - Ages 1-79 yo.  Never done   Tetanus Vaccine  Never done   Cologuard (Stool DNA test)  Never done   Zoster (Shingles) Vaccine (1 of 2) Never done   COVID-19 Vaccine (4 - Booster for Pfizer series) 05/29/2020   Flu Shot  11/13/2020   Eye exam for diabetics  03/27/2021   Hemoglobin A1C  05/17/2021   Complete foot exam   11/14/2021   Pneumonia vaccines (2 of 2 - PPSV23) 02/21/2022   HPV Vaccine  Aged Out

## 2020-12-16 NOTE — Patient Instructions (Signed)
Health Maintenance, Male Adopting a healthy lifestyle and getting preventive care are important in promoting health and wellness. Ask your health care provider about: The right schedule for you to have regular tests and exams. Things you can do on your own to prevent diseases and keep yourself healthy. What should I know about diet, weight, and exercise? Eat a healthy diet  Eat a diet that includes plenty of vegetables, fruits, low-fat dairy products, and lean protein. Do not eat a lot of foods that are high in solid fats, added sugars, or sodium. Maintain a healthy weight Body mass index (BMI) is a measurement that can be used to identify possible weight problems. It estimates body fat based on height and weight. Your health care provider can help determine your BMI and help you achieve or maintain a healthy weight. Get regular exercise Get regular exercise. This is one of the most important things you can do for your health. Most adults should: Exercise for at least 150 minutes each week. The exercise should increase your heart rate and make you sweat (moderate-intensity exercise). Do strengthening exercises at least twice a week. This is in addition to the moderate-intensity exercise. Spend less time sitting. Even light physical activity can be beneficial. Watch cholesterol and blood lipids Have your blood tested for lipids and cholesterol at 69 years of age, then have this test every 5 years. You may need to have your cholesterol levels checked more often if: Your lipid or cholesterol levels are high. You are older than 69 years of age. You are at high risk for heart disease. What should I know about cancer screening? Many types of cancers can be detected early and may often be prevented. Depending on your health history and family history, you may need to have cancer screening at various ages. This may include screening for: Colorectal cancer. Prostate cancer. Skin cancer. Lung  cancer. What should I know about heart disease, diabetes, and high blood pressure? Blood pressure and heart disease High blood pressure causes heart disease and increases the risk of stroke. This is more likely to develop in people who have high blood pressure readings, are of African descent, or are overweight. Talk with your health care provider about your target blood pressure readings. Have your blood pressure checked: Every 3-5 years if you are 18-39 years of age. Every year if you are 40 years old or older. If you are between the ages of 65 and 75 and are a current or former smoker, ask your health care provider if you should have a one-time screening for abdominal aortic aneurysm (AAA). Diabetes Have regular diabetes screenings. This checks your fasting blood sugar level. Have the screening done: Once every three years after age 45 if you are at a normal weight and have a low risk for diabetes. More often and at a younger age if you are overweight or have a high risk for diabetes. What should I know about preventing infection? Hepatitis B If you have a higher risk for hepatitis B, you should be screened for this virus. Talk with your health care provider to find out if you are at risk for hepatitis B infection. Hepatitis C Blood testing is recommended for: Everyone born from 1945 through 1965. Anyone with known risk factors for hepatitis C. Sexually transmitted infections (STIs) You should be screened each year for STIs, including gonorrhea and chlamydia, if: You are sexually active and are younger than 69 years of age. You are older than 69 years   of age and your health care provider tells you that you are at risk for this type of infection. Your sexual activity has changed since you were last screened, and you are at increased risk for chlamydia or gonorrhea. Ask your health care provider if you are at risk. Ask your health care provider about whether you are at high risk for HIV.  Your health care provider may recommend a prescription medicine to help prevent HIV infection. If you choose to take medicine to prevent HIV, you should first get tested for HIV. You should then be tested every 3 months for as long as you are taking the medicine. Follow these instructions at home: Lifestyle Do not use any products that contain nicotine or tobacco, such as cigarettes, e-cigarettes, and chewing tobacco. If you need help quitting, ask your health care provider. Do not use street drugs. Do not share needles. Ask your health care provider for help if you need support or information about quitting drugs. Alcohol use Do not drink alcohol if your health care provider tells you not to drink. If you drink alcohol: Limit how much you have to 0-2 drinks a day. Be aware of how much alcohol is in your drink. In the [place] one drink equals one 12 oz bottle of beer (355 mL), one 5 oz glass of wine (148 mL), or one 1 oz glass of hard liquor (44 mL). General instructions Schedule regular health, dental, and eye exams. Stay current with your vaccines. Tell your health care provider if: You often feel depressed. You have ever been abused or do not feel safe at home. Summary Adopting a healthy lifestyle and getting preventive care are important in promoting health and wellness. Follow your health care provider's instructions about healthy diet, exercising, and getting tested or screened for diseases. Follow your health care provider's instructions on monitoring your cholesterol and blood pressure. This information is not intended to replace advice given to you by your health care provider. Make sure you discuss any questions you have with your health care provider. Document Revised: 06/09/2020 Document Reviewed: 03/25/2018 Elsevier Patient Education  2022 [employer]vier Inc.  

## 2021-02-06 ENCOUNTER — Other Ambulatory Visit: Payer: Self-pay | Admitting: Family Medicine

## 2021-02-06 DIAGNOSIS — E782 Mixed hyperlipidemia: Secondary | ICD-10-CM

## 2021-02-21 ENCOUNTER — Ambulatory Visit (INDEPENDENT_AMBULATORY_CARE_PROVIDER_SITE_OTHER): Payer: Medicare HMO | Admitting: Family Medicine

## 2021-02-21 ENCOUNTER — Other Ambulatory Visit: Payer: Self-pay

## 2021-02-21 ENCOUNTER — Encounter: Payer: Self-pay | Admitting: Family Medicine

## 2021-02-21 VITALS — BP 114/80 | HR 84 | Temp 97.0°F | Resp 16 | Ht 75.0 in | Wt 208.0 lb

## 2021-02-21 DIAGNOSIS — E1142 Type 2 diabetes mellitus with diabetic polyneuropathy: Secondary | ICD-10-CM

## 2021-02-21 DIAGNOSIS — F17218 Nicotine dependence, cigarettes, with other nicotine-induced disorders: Secondary | ICD-10-CM

## 2021-02-21 DIAGNOSIS — F17200 Nicotine dependence, unspecified, uncomplicated: Secondary | ICD-10-CM | POA: Insufficient documentation

## 2021-02-21 DIAGNOSIS — E1159 Type 2 diabetes mellitus with other circulatory complications: Secondary | ICD-10-CM

## 2021-02-21 DIAGNOSIS — R69 Illness, unspecified: Secondary | ICD-10-CM | POA: Diagnosis not present

## 2021-02-21 DIAGNOSIS — E782 Mixed hyperlipidemia: Secondary | ICD-10-CM | POA: Diagnosis not present

## 2021-02-21 DIAGNOSIS — E291 Testicular hypofunction: Secondary | ICD-10-CM | POA: Diagnosis not present

## 2021-02-21 DIAGNOSIS — I10 Essential (primary) hypertension: Secondary | ICD-10-CM | POA: Diagnosis not present

## 2021-02-21 DIAGNOSIS — Z794 Long term (current) use of insulin: Secondary | ICD-10-CM | POA: Insufficient documentation

## 2021-02-21 DIAGNOSIS — I152 Hypertension secondary to endocrine disorders: Secondary | ICD-10-CM

## 2021-02-21 DIAGNOSIS — B351 Tinea unguium: Secondary | ICD-10-CM | POA: Insufficient documentation

## 2021-02-21 DIAGNOSIS — Z23 Encounter for immunization: Secondary | ICD-10-CM

## 2021-02-21 HISTORY — DX: Tinea unguium: B35.1

## 2021-02-21 LAB — POCT UA - MICROALBUMIN: Microalbumin Ur, POC: 0 mg/L

## 2021-02-21 MED ORDER — LOSARTAN POTASSIUM 100 MG PO TABS: 100.0000 mg | ORAL_TABLET | Freq: Every day | ORAL | 1 refills | Status: DC

## 2021-02-21 MED ORDER — GABAPENTIN 300 MG PO CAPS: 300.0000 mg | ORAL_CAPSULE | Freq: Two times a day (BID) | ORAL | 1 refills | Status: DC

## 2021-02-21 NOTE — Assessment & Plan Note (Signed)
Order labs.

## 2021-02-21 NOTE — Assessment & Plan Note (Signed)
Has not quit. Refuses cessation at this time.

## 2021-02-21 NOTE — Assessment & Plan Note (Signed)
Well controlled.  Continue lipitor 20 mg once daily Continue to work on eating a healthy diet and exercise.  Labs drawn today.

## 2021-02-21 NOTE — Assessment & Plan Note (Signed)
Well-controlled.  Continue losartan 100 mg once daily.

## 2021-02-21 NOTE — Assessment & Plan Note (Signed)
Control: await labs Recommend check sugars fasting daily. Recommend check feet daily. Recommend annual eye exams. Medicines: continue metformin 1000 mg one twice a day.  Continue to work on eating a healthy diet and exercise.  Labs drawn today.

## 2021-02-21 NOTE — Assessment & Plan Note (Signed)
Refer to podiatry

## 2021-02-21 NOTE — Progress Notes (Signed)
Subjective:  Patient ID: Nathaniel Nicholson, male    DOB: [birth year]  Age: 71 y.o. 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  Diabetes: Complicated by neuropathy and nephropathy.  Currently on metformin 1000 mg twice daily.  I have given the patient Jardiance samples.  They have been out of those for approximately 6 to 8 weeks.  His sugars are running 100-130.  He did not feel that he needed the Jardiance and did not call to get a prescription.  Patient is also on losartan 100 mg once daily for nephropathy and hypertension patient also takes gabapentin 300 mg twice daily for neuropathy.  Hyperlipidemia: Currently on Lipitor 20 mg once daily. Hypergonadism: Currently on testosterone shots 250 mg every 2 weeks. GERD: On Nexium 40 mg once daily. Gout: Currently on allopurinol 100 mg once daily.  Patient denies complaints today other than occasional right shoulder pain after sleeping on it.   Current Outpatient Medications on File Prior to Visit  Medication Sig Dispense Refill   allopurinol (ZYLOPRIM) 100 MG tablet Take 1 tablet (100 mg total) by mouth daily. 90 tablet 3   atorvastatin (LIPITOR) 20 MG tablet Take 1 tablet by mouth once daily 90 tablet 0   colchicine 0.6 MG tablet Take 1 tablet by mouth twice daily 14 tablet 1   esomeprazole (NEXIUM) 40 MG capsule Take 40 mg by mouth daily at 12 noon.     metFORMIN (GLUCOPHAGE) 1000 MG tablet Take 1 tablet (1,000 mg total) by mouth 2 (two) times daily with a meal. 180 tablet 0   tadalafil (CIALIS) 10 MG tablet Take 1 tablet (10 mg total) by mouth daily. 30 tablet 3   testosterone cypionate (DEPOTESTOSTERONE CYPIONATE) 200 MG/ML injection Inject 1.25 mLs (250 mg total) into the  muscle every 14 (fourteen) days. 3 mL 5   timolol (TIMOPTIC) 0.25 % ophthalmic solution 1 drop 2 (two) times daily.     No current facility-administered medications on file prior to visit.   Past Medical History:  Diagnosis Date   Diabetes mellitus without complication (HCC)    GERD (gastroesophageal reflux disease)    Hyperlipemia    Hypertension    Nicotine dependence, cigarettes, uncomplicated    Other male erectile dysfunction    Peptic ulcer 2000   Testicular hypofunction    Vitamin D deficiency    Past Surgical History:  Procedure Laterality Date   CATARACT EXTRACTION Left 2017    Family History  Problem Relation Age of Onset   Cancer Father        lung   Cancer Sister        lung   Cancer Brother        throat   Hypertension Other    Diabetes Other    Social History   Socioeconomic History   Marital status: Married    Spouse name: Not on file   Number of children: Not on file   Years of education: Not on file   Highest education level: Not on file  Occupational History   Occupation: Marine scientist  Tobacco Use   Smoking status: Every Day    Packs/day: 1.00    Years: 35.00    Pack years: 35.00    Types: Cigarettes   Smokeless tobacco: Never  Tobacco comments:    1 ppd for > 30 years.  Substance and Sexual Activity   Alcohol use: Yes    Alcohol/week: 28.0 standard drinks    Types: 28 Cans of beer per week    Comment: 4/daily   Drug use: No   Sexual activity: Not on file  Other Topics Concern   Not on file  Social History Narrative   Not on file   Social Determinants of Health   Financial Resource Strain: Not on file  Food Insecurity: Not on file  Transportation Needs: Not on file  Physical Activity: Not on file  Stress: Not on file  Social Connections: Not on file    Review of Systems  Constitutional:  Negative for chills and fever.  HENT:  Positive for congestion. Negative for rhinorrhea and sore throat.   Respiratory:  Negative for  cough and shortness of breath.   Cardiovascular:  Negative for chest pain and palpitations.  Gastrointestinal:  Negative for abdominal pain, constipation, diarrhea, nausea and vomiting.  Genitourinary:  Negative for dysuria and urgency.  Musculoskeletal:  Negative for arthralgias (right shoulder pain), back pain and myalgias.  Neurological:  Negative for dizziness and headaches.  Psychiatric/Behavioral:  Negative for dysphoric mood. The patient is not nervous/anxious.     Objective:  BP 114/80   Pulse 84   Temp (!) 97 F (36.1 C)   Resp 16   Ht 6\' 3"  (1.905 m)   Wt 208 lb (94.3 kg)   BMI 26.00 kg/m   BP/Weight 02/21/2021 11/14/2020 07/31/2020  Systolic BP 114 130 120  Diastolic BP 80 76 86  Wt. (Lbs) 208 209.6 225  BMI 26 26.2 28.12    Physical Exam Vitals reviewed.  Constitutional:      Appearance: Normal appearance.  Neck:     Vascular: No carotid bruit.  Cardiovascular:     Rate and Rhythm: Normal rate and regular rhythm.     Pulses: Normal pulses.     Heart sounds: Normal heart sounds.  Pulmonary:     Effort: Pulmonary effort is normal.     Breath sounds: Normal breath sounds. No wheezing, rhonchi or rales.  Abdominal:     General: Bowel sounds are normal.     Palpations: Abdomen is soft.     Tenderness: There is no abdominal tenderness.  Neurological:     Mental Status: He is alert.  Psychiatric:        Mood and Affect: Mood normal.        Behavior: Behavior normal.    Diabetic Foot Exam - Simple   Simple Foot Form Diabetic Foot exam was performed with the following findings: Yes 02/21/2021  8:12 AM  Visual Inspection See comments: Yes Sensation Testing See comments: Yes Pulse Check Posterior Tibialis and Dorsalis pulse intact bilaterally: Yes Comments Decreased sensation BL.  Nails thickened.       Lab Results  Component Value Date   WBC 3.2 (L) 11/14/2020   HGB 15.8 11/14/2020   HCT 49.3 11/14/2020   PLT 192 11/14/2020   GLUCOSE 178 (H)  11/14/2020   CHOL 98 (L) 11/14/2020   TRIG 111 11/14/2020   HDL 42 11/14/2020   LDLCALC 36 11/14/2020   ALT 21 11/14/2020   AST 17 11/14/2020   NA 139 11/14/2020   K 4.9 11/14/2020   CL 100 11/14/2020   CREATININE 1.07 11/14/2020   BUN 13 11/14/2020   CO2 24 11/14/2020   TSH 1.640 01/19/2020   HGBA1C 9.4 (  H) 11/14/2020   MICROALBUR 0 02/21/2021      Assessment & Plan:   Problem List Items Addressed This Visit       Cardiovascular and Mediastinum   Hypertension associated with diabetes (HCC)    Well-controlled.  Continue losartan 100 mg once daily.      Relevant Medications   losartan (COZAAR) 100 MG tablet   Other Relevant Orders   CBC with Differential/Platelet   Comprehensive metabolic panel   TSH     Endocrine   Diabetic polyneuropathy associated with type 2 diabetes mellitus (HCC)    Control: await labs Recommend check sugars fasting daily. Recommend check feet daily. Recommend annual eye exams. Medicines: continue metformin 1000 mg one twice a day.  Continue to work on eating a healthy diet and exercise.  Labs drawn today.         Relevant Medications   gabapentin (NEURONTIN) 300 MG capsule   losartan (COZAAR) 100 MG tablet   Other Relevant Orders   Hemoglobin A1c   POCT UA - Microalbumin (Completed)   Ambulatory referral to Podiatry   Hypogonadism, male    Order labs.      Relevant Orders   Testosterone,Free and Total     Musculoskeletal and Integument   Onychomycosis of great toe    Refer to podiatry      Relevant Orders   Ambulatory referral to Podiatry     Other   Hyperlipemia - Primary    Well controlled.  Continue lipitor 20 mg once daily Continue to work on eating a healthy diet and exercise.  Labs drawn today.        Relevant Medications   losartan (COZAAR) 100 MG tablet   Other Relevant Orders   Lipid panel   Nicotine dependence    Has not quit. Refuses cessation at this time.      Other Visit Diagnoses     Need  for influenza vaccination       Relevant Orders   Flu Vaccine QUAD High Dose(Fluad) (Completed)     .  Meds ordered this encounter  Medications   gabapentin (NEURONTIN) 300 MG capsule    Sig: Take 1 capsule (300 mg total) by mouth 2 (two) times daily.    Dispense:  180 capsule    Refill:  1   losartan (COZAAR) 100 MG tablet    Sig: Take 1 tablet (100 mg total) by mouth daily.    Dispense:  90 tablet    Refill:  1    Orders Placed This Encounter  Procedures   Flu Vaccine QUAD High Dose(Fluad)   CBC with Differential/Platelet   Comprehensive metabolic panel   Hemoglobin A1c   Lipid panel   TSH   Testosterone,Free and Total   Ambulatory referral to Podiatry   POCT UA - Microalbumin     Follow-up: Return in about 3 months (around 05/24/2021) for chronic fasting.  An After Visit Summary was printed and given to the patient.  Blane Ohara, MD [facility] [phone]

## 2021-02-22 LAB — LIPID PANEL
Chol/HDL Ratio: 2.2 ratio (ref 0.0–5.0)
Cholesterol, Total: 134 mg/dL (ref 100–199)
HDL: 62 mg/dL (ref >39)
LDL Chol Calc (NIH): 47 mg/dL (ref 0–99)
Triglycerides: 150 mg/dL — ABNORMAL HIGH (ref 0–149)
VLDL Cholesterol Cal: 25 mg/dL (ref 5–40)

## 2021-02-22 LAB — COMPREHENSIVE METABOLIC PANEL
ALT: 24 IU/L (ref 0–44)
AST: 22 IU/L (ref 0–40)
Albumin/Globulin Ratio: 2.2 (ref 1.2–2.2)
Albumin: 5.1 g/dL — ABNORMAL HIGH (ref 3.8–4.8)
Alkaline Phosphatase: 107 IU/L (ref 44–121)
BUN/Creatinine Ratio: 14 (ref 10–24)
BUN: 14 mg/dL (ref 8–27)
Bilirubin Total: 0.3 mg/dL (ref 0.0–1.2)
CO2: 25 mmol/L (ref 20–29)
Calcium: 9.9 mg/dL (ref 8.6–10.2)
Chloride: 100 mmol/L (ref 96–106)
Creatinine, Ser: 0.99 mg/dL (ref 0.76–1.27)
Globulin, Total: 2.3 g/dL (ref 1.5–4.5)
Glucose: 135 mg/dL — ABNORMAL HIGH (ref 70–99)
Potassium: 4.8 mmol/L (ref 3.5–5.2)
Sodium: 138 mmol/L (ref 134–144)
Total Protein: 7.4 g/dL (ref 6.0–8.5)
eGFR: 83 mL/min/{1.73_m2} (ref >59)

## 2021-02-22 LAB — TESTOSTERONE,FREE AND TOTAL
Testosterone, Free: 10.2 pg/mL (ref 6.6–18.1)
Testosterone: 223 ng/dL — ABNORMAL LOW (ref 264–916)

## 2021-02-22 LAB — CBC WITH DIFFERENTIAL/PLATELET
Basophils Absolute: 0 10*3/uL (ref 0.0–0.2)
Basos: 1 %
EOS (ABSOLUTE): 0.2 10*3/uL (ref 0.0–0.4)
Eos: 5 %
Hematocrit: 43.7 % (ref 37.5–51.0)
Hemoglobin: 14.8 g/dL (ref 13.0–17.7)
Immature Grans (Abs): 0 10*3/uL (ref 0.0–0.1)
Immature Granulocytes: 0 %
Lymphocytes Absolute: 1.8 10*3/uL (ref 0.7–3.1)
Lymphs: 45 %
MCH: 32.5 pg (ref 26.6–33.0)
MCHC: 33.9 g/dL (ref 31.5–35.7)
MCV: 96 fL (ref 79–97)
Monocytes Absolute: 0.3 10*3/uL (ref 0.1–0.9)
Monocytes: 8 %
Neutrophils Absolute: 1.6 10*3/uL (ref 1.4–7.0)
Neutrophils: 41 %
Platelets: 207 10*3/uL (ref 150–450)
RBC: 4.56 x10E6/uL (ref 4.14–5.80)
RDW: 12.6 % (ref 11.6–15.4)
WBC: 3.9 10*3/uL (ref 3.4–10.8)

## 2021-02-22 LAB — HEMOGLOBIN A1C
Est. average glucose Bld gHb Est-mCnc: 177 mg/dL
Hgb A1c MFr Bld: 7.8 % — ABNORMAL HIGH (ref 4.8–5.6)

## 2021-02-22 LAB — CARDIOVASCULAR RISK ASSESSMENT

## 2021-02-22 LAB — TSH: TSH: 2.13 u[IU]/mL (ref 0.450–4.500)

## 2021-02-23 ENCOUNTER — Other Ambulatory Visit: Payer: Self-pay

## 2021-02-23 MED ORDER — SYNJARDY XR 12.5-1000 MG PO TB24: 2.0000 | ORAL_TABLET | Freq: Every morning | ORAL | 1 refills | Status: DC

## 2021-02-27 ENCOUNTER — Telehealth: Payer: Self-pay

## 2021-02-27 ENCOUNTER — Other Ambulatory Visit: Payer: Self-pay | Admitting: Family Medicine

## 2021-02-27 MED ORDER — METFORMIN HCL 1000 MG PO TABS: 1000.0000 mg | ORAL_TABLET | Freq: Two times a day (BID) | ORAL | 1 refills | Status: DC

## 2021-02-27 NOTE — Telephone Encounter (Signed)
[name] (spouse) calling requesting synjardy be changed back to metformin. The expense of synjardy is far more than expected. Pt and wife requesting he return to metformin. He is out of medication.   Please advise next step.   Lorita Officer, CCMA 02/27/21 9:05 AM

## 2021-02-27 NOTE — Telephone Encounter (Signed)
Metformin sent. Pt notified. kc

## 2021-03-02 ENCOUNTER — Other Ambulatory Visit: Payer: Self-pay

## 2021-03-02 MED ORDER — METFORMIN HCL 1000 MG PO TABS: 1000.0000 mg | ORAL_TABLET | Freq: Two times a day (BID) | ORAL | 1 refills | Status: DC

## 2021-03-02 NOTE — Telephone Encounter (Signed)
Pharmacy did not receive script.   Lorita Officer, [place] 03/02/21 8:27 AM

## 2021-03-14 DIAGNOSIS — Z7984 Long term (current) use of oral hypoglycemic drugs: Secondary | ICD-10-CM | POA: Diagnosis not present

## 2021-03-14 DIAGNOSIS — E119 Type 2 diabetes mellitus without complications: Secondary | ICD-10-CM | POA: Diagnosis not present

## 2021-03-14 DIAGNOSIS — M79675 Pain in left toe(s): Secondary | ICD-10-CM | POA: Diagnosis not present

## 2021-03-14 DIAGNOSIS — B351 Tinea unguium: Secondary | ICD-10-CM | POA: Diagnosis not present

## 2021-05-02 ENCOUNTER — Other Ambulatory Visit: Payer: Self-pay | Admitting: Family Medicine

## 2021-05-02 DIAGNOSIS — E782 Mixed hyperlipidemia: Secondary | ICD-10-CM

## 2021-05-04 ENCOUNTER — Telehealth: Payer: Self-pay | Admitting: Family Medicine

## 2021-05-04 DIAGNOSIS — E119 Type 2 diabetes mellitus without complications: Secondary | ICD-10-CM | POA: Diagnosis not present

## 2021-05-04 DIAGNOSIS — H40013 Open angle with borderline findings, low risk, bilateral: Secondary | ICD-10-CM | POA: Diagnosis not present

## 2021-05-04 NOTE — Telephone Encounter (Signed)
Nathaniel Nicholson would like to know if you would write him a letter to be excused from jury duty, he is scheduled for 06/12/21. [name] can come pick it up anytime, they actually have an appt with you on 05/29/21

## 2021-05-04 NOTE — Telephone Encounter (Signed)
I spoke with [place]. Letter denied. I believe he would do find in jury duty. Kc

## 2021-05-12 ENCOUNTER — Other Ambulatory Visit: Payer: Self-pay | Admitting: Physician Assistant

## 2021-05-12 DIAGNOSIS — E782 Mixed hyperlipidemia: Secondary | ICD-10-CM

## 2021-05-28 NOTE — Progress Notes (Signed)
Subjective:  Patient ID: Nathaniel Nicholson, male    DOB: 1950-05-06  Age: 71 y.o. MRN: [id number]  Chief Complaint  Patient presents with   Diabetes   Hyperlipidemia   Hypertension    HPI Diabetes:  Complications: neuropathy Glucose checking:  Glucose logs:126-148 Hypoglycemia: no  Most recent A1C: 7.8.  Current medications: metformin 1000 mg one twice daily, gabapentin 300 mg twice daily  Last Eye Exam: Jan 2023 Foot checks: daily  Hyperlipidemia: Current medications: lipitor 20 mg daily.   Hypertension: Current medications:  Losartan 100 mg daily   Diet: healthy Exercise: walk.   Gout:  Allopurinol 100 mg daily uses colchicine for gout flares.  GERD: on nexium 40 mg once daily.  Hypogonadism: ON testosterone 250 mg every 14 days. Helps with energy.   Current Outpatient Medications on File Prior to Visit  Medication Sig Dispense Refill   allopurinol (ZYLOPRIM) 100 MG tablet Take 1 tablet (100 mg total) by mouth daily. 90 tablet 3   atorvastatin (LIPITOR) 20 MG tablet Take 1 tablet by mouth once daily 90 tablet 0   colchicine 0.6 MG tablet Take 1 tablet by mouth twice daily 14 tablet 1   esomeprazole (NEXIUM) 40 MG capsule Take 40 mg by mouth daily at 12 noon.     gabapentin (NEURONTIN) 300 MG capsule Take 1 capsule (300 mg total) by mouth 2 (two) times daily. 180 capsule 1   losartan (COZAAR) 100 MG tablet Take 1 tablet (100 mg total) by mouth daily. 90 tablet 1   metFORMIN (GLUCOPHAGE) 1000 MG tablet Take 1 tablet (1,000 mg total) by mouth 2 (two) times daily with a meal. 180 tablet 1   tadalafil (CIALIS) 10 MG tablet Take 1 tablet (10 mg total) by mouth daily. 30 tablet 3   testosterone cypionate (DEPOTESTOSTERONE CYPIONATE) 200 MG/ML injection Inject 1.25 mLs (250 mg total) into the muscle every 14 (fourteen) days. 3 mL 5   timolol (TIMOPTIC) 0.25 % ophthalmic solution 1 drop 2 (two) times daily.     No current facility-administered medications on file prior to  visit.   Past Medical History:  Diagnosis Date   Diabetes mellitus without complication (HCC)    GERD (gastroesophageal reflux disease)    Hyperlipemia    Hypertension    Nicotine dependence, cigarettes, uncomplicated    Other male erectile dysfunction    Peptic ulcer 2000   Testicular hypofunction    Vitamin D deficiency    Past Surgical History:  Procedure Laterality Date   CATARACT EXTRACTION Left 2017    Family History  Problem Relation Age of Onset   Cancer Father        lung   Cancer Sister        lung   Cancer Brother        throat   Hypertension Other    Diabetes Other    Social History   Socioeconomic History   Marital status: Married    Spouse name: Not on file   Number of children: Not on file   Years of education: Not on file   Highest education level: Not on file  Occupational History   Occupation: Marine scientist  Tobacco Use   Smoking status: Every Day    Packs/day: 1.00    Years: 35.00    Pack years: 35.00    Types: Cigarettes   Smokeless tobacco: Never   Tobacco comments:    1 ppd for > 30 years.  Substance and Sexual Activity   Alcohol  use: Yes    Alcohol/week: 28.0 standard drinks    Types: 28 Cans of beer per week    Comment: 4/daily   Drug use: No   Sexual activity: Not on file  Other Topics Concern   Not on file  Social History Narrative   Not on file   Social Determinants of Health   Financial Resource Strain: Not on file  Food Insecurity: Not on file  Transportation Needs: Not on file  Physical Activity: Not on file  Stress: Not on file  Social Connections: Not on file    Review of Systems  Constitutional:  Negative for chills and fever.  HENT:  Negative for congestion, rhinorrhea and sore throat.   Respiratory:  Negative for cough and shortness of breath.   Cardiovascular:  Negative for chest pain and palpitations.  Gastrointestinal:  Negative for abdominal pain, constipation, diarrhea, nausea and vomiting.   Genitourinary:  Negative for dysuria and urgency.  Musculoskeletal:  Negative for arthralgias, back pain and myalgias.  Neurological:  Negative for dizziness and headaches.  Psychiatric/Behavioral:  Negative for dysphoric mood. The patient is not nervous/anxious.     Objective:  BP 134/78    Pulse 94    Temp (!) 97.3 F (36.3 C)    Resp 16    Ht 6\' 1"  (1.854 m)    Wt 209 lb (94.8 kg)    SpO2 97%    BMI 27.57 kg/m   BP/Weight 05/29/2021 02/21/2021 11/14/2020  Systolic BP 134 114 130  Diastolic BP 78 80 76  Wt. (Lbs) 209 208 209.6  BMI 27.57 26 26.2    Physical Exam Vitals reviewed.  Constitutional:      Appearance: Normal appearance.  Neck:     Vascular: No carotid bruit.  Cardiovascular:     Rate and Rhythm: Normal rate and regular rhythm.     Pulses: Normal pulses.     Heart sounds: Normal heart sounds.  Pulmonary:     Effort: Pulmonary effort is normal.     Breath sounds: Normal breath sounds. No wheezing, rhonchi or rales.  Abdominal:     General: Bowel sounds are normal.     Palpations: Abdomen is soft.     Tenderness: There is no abdominal tenderness.  Neurological:     Mental Status: He is alert.  Psychiatric:        Mood and Affect: Mood normal.        Behavior: Behavior normal.    Diabetic Foot Exam - Simple   Simple Foot Form Diabetic Foot exam was performed with the following findings: Yes 05/29/2021  8:12 AM  Visual Inspection See comments: Yes Sensation Testing See comments: Yes Pulse Check Posterior Tibialis and Dorsalis pulse intact bilaterally: Yes Comments Decreased sensation.  Thickened nails      Lab Results  Component Value Date   WBC 3.5 05/29/2021   HGB 14.1 05/29/2021   HCT 41.3 05/29/2021   PLT 176 05/29/2021   GLUCOSE 145 (H) 05/29/2021   CHOL 137 05/29/2021   TRIG 97 05/29/2021   HDL 62 05/29/2021   LDLCALC 57 05/29/2021   ALT 25 05/29/2021   AST 21 05/29/2021   NA 145 (H) 05/29/2021   K 5.0 05/29/2021   CL 107 (H)  05/29/2021   CREATININE 1.01 05/29/2021   BUN 14 05/29/2021   CO2 25 05/29/2021   TSH 2.130 02/21/2021   HGBA1C 8.0 (H) 05/29/2021   MICROALBUR 0 02/21/2021      Assessment & Plan:  Problem List Items Addressed This Visit       Cardiovascular and Mediastinum   Hypertension associated with diabetes (HCC)    Well controlled.  No changes to medicines.  Continue to work on eating a healthy diet and exercise.  Labs drawn today.        Relevant Orders   CBC With Diff/Platelet (Completed)   Comprehensive metabolic panel (Completed)   AMB Referral to [facility] Coordinaton     Digestive   Acid reflux    The current medical regimen is effective;  continue present plan and medications.          Endocrine   Diabetic polyneuropathy associated with type 2 diabetes mellitus (HCC)    Control: fair Recommend check sugars fasting daily. Recommend check feet daily. Recommend annual eye exams. Medicines: Await labs/testing for assessment and recommendations. Continue to work on eating a healthy diet and exercise.  Labs drawn today.         Relevant Medications   varenicline (CHANTIX CONTINUING MONTH PAK) 1 MG tablet   Other Relevant Orders   Microalbumin / creatinine urine ratio (Completed)   Hemoglobin A1c (Completed)   [facility]   Hypogonadism, male    The current medical regimen is effective;  continue present plan and medications.         Other   Nicotine dependence    Rx: chantix given.  Education given.       Relevant Medications   varenicline (CHANTIX CONTINUING MONTH PAK) 1 MG tablet   Other Relevant Orders   CT CHEST LUNG CA SCREEN LOW DOSE W/O CM   Hyperlipemia - Primary    Well controlled.  No changes to medicines.  Continue to work on eating a healthy diet and exercise.  Labs drawn today.        Relevant Orders   Lipid panel (Completed)   AMB Referral to [facility] Coordin[facility]   Cardiovascular Risk  Assessment (Completed)   Encounter for screening for lung cancer   Relevant Orders   CT CHEST LUNG CA SCREEN LOW DOSE W/O CM  .  Meds ordered this encounter  Medications   varenicline (CHANTIX CONTINUING MONTH PAK) 1 MG tablet    Sig: Take 0.5 tablets (0.5 mg total) by mouth daily for 3 days, THEN 0.5 tablets (0.5 mg total) 2 (two) times daily for 4 days, THEN 1 tablet (1 mg total) 2 (two) times daily for 28 days.    Dispense:  60 tablet    Refill:  5    Orders Placed This Encounter  Procedures   CT CHEST LUNG CA SCREEN LOW DOSE W/O CM   CBC With Diff/Platelet   Microalbumin / creatinine urine ratio   Comprehensive metabolic panel   Lipid panel   Hemoglobin A1c   Cardiovascular Risk Assessment   AMB Referral to [facility] Coordinaton     Follow-up: Return in about 3 months (around 08/26/2021) for chronic fasting, awv with [name] anytime. Marland Kitchen  An After Visit Summary was printed and given to the patient.  Blane Ohara, MD [facility] [phone]

## 2021-05-29 ENCOUNTER — Encounter: Payer: Self-pay | Admitting: Family Medicine

## 2021-05-29 ENCOUNTER — Telehealth (HOSPITAL_BASED_OUTPATIENT_CLINIC_OR_DEPARTMENT_OTHER): Payer: Self-pay

## 2021-05-29 ENCOUNTER — Ambulatory Visit (INDEPENDENT_AMBULATORY_CARE_PROVIDER_SITE_OTHER): Payer: Medicare HMO | Admitting: Family Medicine

## 2021-05-29 ENCOUNTER — Ambulatory Visit (INDEPENDENT_AMBULATORY_CARE_PROVIDER_SITE_OTHER): Payer: Medicare HMO

## 2021-05-29 ENCOUNTER — Other Ambulatory Visit: Payer: Self-pay

## 2021-05-29 ENCOUNTER — Telehealth: Payer: Self-pay | Admitting: Family Medicine

## 2021-05-29 VITALS — BP 134/78 | HR 94 | Temp 97.3°F | Resp 16 | Ht 73.0 in | Wt 209.0 lb

## 2021-05-29 DIAGNOSIS — Z122 Encounter for screening for malignant neoplasm of respiratory organs: Secondary | ICD-10-CM

## 2021-05-29 DIAGNOSIS — Z23 Encounter for immunization: Secondary | ICD-10-CM | POA: Diagnosis not present

## 2021-05-29 DIAGNOSIS — E291 Testicular hypofunction: Secondary | ICD-10-CM | POA: Diagnosis not present

## 2021-05-29 DIAGNOSIS — E1159 Type 2 diabetes mellitus with other circulatory complications: Secondary | ICD-10-CM | POA: Diagnosis not present

## 2021-05-29 DIAGNOSIS — I152 Hypertension secondary to endocrine disorders: Secondary | ICD-10-CM | POA: Diagnosis not present

## 2021-05-29 DIAGNOSIS — R69 Illness, unspecified: Secondary | ICD-10-CM | POA: Diagnosis not present

## 2021-05-29 DIAGNOSIS — K219 Gastro-esophageal reflux disease without esophagitis: Secondary | ICD-10-CM | POA: Diagnosis not present

## 2021-05-29 DIAGNOSIS — E1142 Type 2 diabetes mellitus with diabetic polyneuropathy: Secondary | ICD-10-CM | POA: Diagnosis not present

## 2021-05-29 DIAGNOSIS — E782 Mixed hyperlipidemia: Secondary | ICD-10-CM | POA: Diagnosis not present

## 2021-05-29 DIAGNOSIS — F17218 Nicotine dependence, cigarettes, with other nicotine-induced disorders: Secondary | ICD-10-CM

## 2021-05-29 MED ORDER — VARENICLINE TARTRATE 1 MG PO TABS: ORAL_TABLET | ORAL | 5 refills | Status: AC

## 2021-05-29 NOTE — Patient Instructions (Addendum)
Rx: chantix for quitting smoking.   Sent Chronic Care management referral to pharmacist to try to get some patient assistance for medicines.

## 2021-05-29 NOTE — Assessment & Plan Note (Signed)
Well controlled.  ?No changes to medicines.  ?Continue to work on eating a healthy diet and exercise.  ?Labs drawn today.  ?

## 2021-05-29 NOTE — Chronic Care Management (AMB) (Signed)
°  Chronic Care Management   Note  05/29/2021 Name: Nathaniel Nicholson MRN: [id number] DOB: 05-Aug-1950  Nathaniel Nicholson is a 71 y.o. year old male who is a primary care patient of Cox, Kirsten, MD. I reached out to Rudene Anda by phone today in response to a referral sent by Nathaniel Nicholson's PCP, Cox, Kirsten, MD.   Nathaniel Nicholson was given information about Chronic Care Management services today including:  CCM service includes personalized support from designated clinical staff supervised by his physician, including individualized plan of care and coordination with other care providers 24/7 contact phone numbers for assistance for urgent and routine care needs. Service will only be billed when office clinical staff spend 20 minutes or more in a month to coordinate care. Only one practitioner may furnish and bill the service in a calendar month. The patient may stop CCM services at any time (effective at the end of the month) by phone call to the office staff.   Patient agreed to services and verbal consent obtained.   Follow up plan:   Tatjana Secretary/administrator

## 2021-05-29 NOTE — Assessment & Plan Note (Signed)
The current medical regimen is effective;  continue present plan and medications.  

## 2021-05-29 NOTE — Assessment & Plan Note (Signed)
Control: fair Recommend check sugars fasting daily. Recommend check feet daily. Recommend annual eye exams. Medicines: Await labs/testing for assessment and recommendations. Continue to work on eating a healthy diet and exercise.  Labs drawn today.

## 2021-05-30 ENCOUNTER — Other Ambulatory Visit: Payer: Self-pay | Admitting: Family Medicine

## 2021-05-30 LAB — COMPREHENSIVE METABOLIC PANEL
ALT: 25 IU/L (ref 0–44)
AST: 21 IU/L (ref 0–40)
Albumin/Globulin Ratio: 2.2 (ref 1.2–2.2)
Albumin: 4.8 g/dL (ref 3.8–4.8)
Alkaline Phosphatase: 92 IU/L (ref 44–121)
BUN/Creatinine Ratio: 14 (ref 10–24)
BUN: 14 mg/dL (ref 8–27)
Bilirubin Total: 0.3 mg/dL (ref 0.0–1.2)
CO2: 25 mmol/L (ref 20–29)
Calcium: 9.7 mg/dL (ref 8.6–10.2)
Chloride: 107 mmol/L — ABNORMAL HIGH (ref 96–106)
Creatinine, Ser: 1.01 mg/dL (ref 0.76–1.27)
Globulin, Total: 2.2 g/dL (ref 1.5–4.5)
Glucose: 145 mg/dL — ABNORMAL HIGH (ref 70–99)
Potassium: 5 mmol/L (ref 3.5–5.2)
Sodium: 145 mmol/L — ABNORMAL HIGH (ref 134–144)
Total Protein: 7 g/dL (ref 6.0–8.5)
eGFR: 81 mL/min/{1.73_m2} (ref >59)

## 2021-05-30 LAB — LIPID PANEL
Chol/HDL Ratio: 2.2 ratio (ref 0.0–5.0)
Cholesterol, Total: 137 mg/dL (ref 100–199)
HDL: 62 mg/dL (ref >39)
LDL Chol Calc (NIH): 57 mg/dL (ref 0–99)
Triglycerides: 97 mg/dL (ref 0–149)
VLDL Cholesterol Cal: 18 mg/dL (ref 5–40)

## 2021-05-30 LAB — CBC WITH DIFF/PLATELET
Basophils Absolute: 0 10*3/uL (ref 0.0–0.2)
Basos: 0 %
EOS (ABSOLUTE): 0.1 10*3/uL (ref 0.0–0.4)
Eos: 4 %
Hematocrit: 41.3 % (ref 37.5–51.0)
Hemoglobin: 14.1 g/dL (ref 13.0–17.7)
Immature Grans (Abs): 0 10*3/uL (ref 0.0–0.1)
Immature Granulocytes: 0 %
Lymphocytes Absolute: 1.4 10*3/uL (ref 0.7–3.1)
Lymphs: 39 %
MCH: 32.5 pg (ref 26.6–33.0)
MCHC: 34.1 g/dL (ref 31.5–35.7)
MCV: 95 fL (ref 79–97)
Monocytes Absolute: 0.4 10*3/uL (ref 0.1–0.9)
Monocytes: 11 %
Neutrophils Absolute: 1.6 10*3/uL (ref 1.4–7.0)
Neutrophils: 46 %
Platelets: 176 10*3/uL (ref 150–450)
RBC: 4.34 x10E6/uL (ref 4.14–5.80)
RDW: 11.4 % — ABNORMAL LOW (ref 11.6–15.4)
WBC: 3.5 10*3/uL (ref 3.4–10.8)

## 2021-05-30 LAB — HEMOGLOBIN A1C
Est. average glucose Bld gHb Est-mCnc: 183 mg/dL
Hgb A1c MFr Bld: 8 % — ABNORMAL HIGH (ref 4.8–5.6)

## 2021-05-30 LAB — CARDIOVASCULAR RISK ASSESSMENT

## 2021-05-30 LAB — MICROALBUMIN / CREATININE URINE RATIO
Creatinine, Urine: 62.3 mg/dL
Microalb/Creat Ratio: 41 mg/g creat — ABNORMAL HIGH (ref 0–29)
Microalbumin, Urine: 25.6 ug/mL

## 2021-05-30 NOTE — Assessment & Plan Note (Signed)
Rx: chantix given.  Education given.

## 2021-05-30 NOTE — Progress Notes (Signed)
Blood count normal.  Liver function normal.  Kidney function normal.  Cholesterol: well controlled.  HBA1C: 8. Not at goal and Spilling protein in urine also. Patient needs to be on farxiga or jardiance (can do combo with metformin - xigduo or synjardy) If they can check which is preferred and cheaper with insurance, I can send rx. Also I will send a note to CCM pharmacist, Ovid Curd, to whom I just sent a referral to see if he can get him patient assistance applications ready.

## 2021-06-14 ENCOUNTER — Telehealth: Payer: Self-pay

## 2021-06-14 NOTE — Chronic Care Management (AMB) (Signed)
? ? ?Chronic Care Management ?Pharmacy Assistant  ? ?Name: Nathaniel Nicholson  MRN: [id number] DOB: 12-27-1950 ? ? ?Reason for Encounter: Chart Prep for initial visit with CPP ?  ?Conditions to be addressed/monitored: ?HTN, HLD, DMII, GERD, and Tobacco Use, Myopia of both eyes, Senile Nuclear Sclerosis ? ?Recent office visits:  ?05/29/21 Blane Ohara MD. Seen for routine visit. Referral to [facility] Coordination. Started on Varenicline Tartrate 1mg . ? ?02/27/21 Cox, Kirsten MD. Orders Only. D/C Synjardy 12.5 due to cost.  Ordered Metformin 1000mg .  ? ?02/21/21 MD. Seen for routine visit. Referral to Podiatry. No med changes.  ? ?12/16/20 Person, Blane Ohara CMA. Medicare Annual Wellness. No med changes.  ? ?Recent consult visits:  ?03/14/21 (Podiatry) Mariann Barter DPM. Seen for toenail fungus. No med changes.  ? ?Hospital visits:  ?None in previous 6 months ? ?Medications: ?Outpatient Encounter Medications as of 06/14/2021  ?Medication Sig  ? allopurinol (ZYLOPRIM) 100 MG tablet Take 1 tablet (100 mg total) by mouth daily.  ? atorvastatin (LIPITOR) 20 MG tablet Take 1 tablet by mouth once daily  ? colchicine 0.6 MG tablet Take 1 tablet by mouth twice daily  ? esomeprazole (NEXIUM) 40 MG capsule Take 40 mg by mouth daily at 12 noon.  ? gabapentin (NEURONTIN) 300 MG capsule Take 1 capsule (300 mg total) by mouth 2 (two) times daily.  ? losartan (COZAAR) 100 MG tablet Take 1 tablet (100 mg total) by mouth daily.  ? metFORMIN (GLUCOPHAGE) 1000 MG tablet Take 1 tablet (1,000 mg total) by mouth 2 (two) times daily with a meal.  ? tadalafil (CIALIS) 10 MG tablet Take 1 tablet (10 mg total) by mouth daily.  ? testosterone cypionate (DEPOTESTOSTERONE CYPIONATE) 200 MG/ML injection Inject 1.25 mLs (250 mg total) into the muscle every 14 (fourteen) days.  ? timolol (TIMOPTIC) 0.25 % ophthalmic solution 1 drop 2 (two) times daily.  ? varenicline (CHANTIX CONTINUING MONTH PAK) 1 MG tablet Take 0.5 tablets (0.5 mg total) by  mouth daily for 3 days, THEN 0.5 tablets (0.5 mg total) 2 (two) times daily for 4 days, THEN 1 tablet (1 mg total) 2 (two) times daily for 28 days.  ? ?No facility-administered encounter medications on file as of 06/14/2021.  ? ? ? ?Lab Results  ?Component Value Date/Time  ? HGBA1C 8.0 (H) 05/29/2021 08:30 AM  ? HGBA1C 7.8 (H) 02/21/2021 08:13 AM  ? MICROALBUR 0 02/21/2021 08:15 AM  ? MICROALBUR 80 01/19/2020 08:12 AM  ?  ? ?BP Readings from Last 3 Encounters:  ?05/29/21 134/78  ?02/21/21 114/80  ?11/14/20 130/76  ? ? ?Patient Questions:  ? ?Have you seen any other providers since your last visit with PCP? No ? ?Any changes in your medications or health? No, wife denies any changes  ? ?Any side effects from any medications? No, denies any side effects  ? ?Do you have an symptoms or problems not managed by your medications? No ? ?Any concerns about your health right now? No, pt wife stated he is doing really good but they would like his A1C to come down since last blood work.  ? ?Has your provider asked that you check blood pressure, blood sugar, or follow special diet at home? Yes, pt checks his blood sugars ranging 127-135. Pt wife does check his blood pressure that is normally 138/76. ? ?Do you get any type of exercise on a regular basis? Yes, pt walks 1/2 mile daily to a local store  ? ?Can you think of  a goal you would like to reach for your health? Yes, they want to get his A1C down  ? ?Do you have any problems getting your medications? Yes, pt had to D/C the Synjardy due to cost but is back on Metformin  ? ?Is there anything that you would like to discuss during the appointment? Yes, pt wife would like to let you know she will not be there for his appt but pt is good about answering the questions but he may call he if he does not know the answer to a question.  ? ? ?Hal Norrington was reminded to have all medications, supplements and any blood glucose and blood pressure readings available for review with Artelia Laroche, Pharm. D, at his telephone visit on 06/19/21 at 8:30am .  ? ?Fill History ?Chantix            None noted  ?Gabapentin  02/21/21   90ds ?Allpurinol 05/13/21   90ds ?Testosterone  01/23/21 30ds ?Cialis  07/31/20   30ds ?Colchicine  05/11/20   7ds ?Timolol  05/07/21   90ds ? ? ?Star Rating Drugs:  ?Medication:  Last Fill: Day Supply ?Atorvastatin   05/02/21 30ds ?   02/07/21 90ds ?Metformin   05/13/21 90ds ?   03/06/21 90ds ?Losartan   01/19/20 90ds ?   01/17/20 90ds ? ?Care Gaps: ?Last annual wellness visit?12/16/20 ?Last eye exam / retinopathy screening?03/27/20 ?Last diabetic foot exam?05/29/21 ? ?Roxana Hires, CMA ?Clinical Pharmacist Assistant  ?[phone]  ?

## 2021-06-19 ENCOUNTER — Ambulatory Visit (INDEPENDENT_AMBULATORY_CARE_PROVIDER_SITE_OTHER): Payer: Medicare HMO

## 2021-06-19 NOTE — Progress Notes (Cosign Needed)
Chronic Care Management Pharmacy Note  06/19/2021 Name:  Nathaniel Nicholson MRN:  [id number] DOB:  1950/04/22  Summary: -Pleasant 71 year old male presents for initial CCM visit. 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   Recommendations/Changes made from today's visit: -Patient states BP is normally in 704'U systolic, will ask PCP what goal BP she'd like for patient. Addition of SGLT2(-) should most likely bring patient below 130's -Unable to afford Empagliflozin, will get samples and start PAP process ASAP -Patient will to try smoking cessation, will ask PCP for Bupropion (Patient calling 1800 Quitline after visit) -Will ask PCP for Uric Acid test at next lab visit -Will ask PCP about COPD assessment at next PCP visit, patient states he is coughing a lot more recently  Subjective: Nathaniel Nicholson is an 71 y.o. year old male who is a primary patient of Cox, Kirsten, MD.  The CCM team was consulted for assistance with disease management and care coordination needs.    Engaged with patient by telephone for initial visit in response to provider referral for pharmacy case management and/or care coordination services.   Consent to Services:  The patient was given the following information about Chronic Care Management services today, agreed to services, and gave verbal consent: 1. CCM service includes personalized support from designated clinical staff supervised by the primary care provider, including individualized plan of care and coordination with other care providers 2. 24/7 contact phone numbers for assistance for urgent and routine care needs. 3. Service will only be billed when office clinical staff spend 20 minutes or more in a month to coordinate care. 4. Only one practitioner may furnish and bill the service in a calendar  month. 5.The patient may stop CCM services at any time (effective at the end of the month) by phone call to the office staff. 6. The patient will be responsible for cost sharing (co-pay) of up to 20% of the service fee (after annual deductible is met). Patient agreed to services and consent obtained.  Patient Care Team: Rochel Brome, MD as PCP - General (Family Medicine) Lane Hacker, [facility] as Pharmacist (Pharmacist)  Recent office visits:  05/29/21 Rochel Brome MD. Seen for routine visit. Referral to [facility] Coordination. Started on Varenicline Tartrate 30m.   02/27/21 Cox, Kirsten MD. Orders Only. D/C Synjardy 12.5 due to cost.  Ordered Metformin 10081m    02/21/21 CoRochel BromeD. Seen for routine visit. Referral to Podiatry. No med changes.    12/16/20 Person, Kieandra CMA. Medicare Annual Wellness. No med changes.    Recent consult visits:  03/14/21 (Podiatry) TiClide DalesPM. Seen for toenail fungus. No med changes.    Hospital visits:  None in previous 6 months   Objective:  Lab Results  Component Value Date   CREATININE 1.01 05/29/2021   BUN 14 05/29/2021   EGFR 81 05/29/2021   GFRNONAA 66 05/17/2020   GFRAA 77 05/17/2020   NA 145 (H) 05/29/2021   K 5.0 05/29/2021   CALCIUM 9.7 05/29/2021   CO2 25 05/29/2021   GLUCOSE 145 (H) 05/29/2021    Lab Results  Component Value Date/Time   HGBA1C 8.0 (H) 05/29/2021 08:30 AM   HGBA1C 7.8 (H) 02/21/2021 08:13 AM   MICROALBUR 0 02/21/2021 08:15 AM   MICROALBUR 80 01/19/2020 08:12 AM  Last diabetic Eye exam:  Lab Results  Component Value Date/Time   HMDIABEYEEXA No Retinopathy 03/27/2020 12:00 AM    Last diabetic Foot exam: No results found for: HMDIABFOOTEX   Lab Results  Component Value Date   CHOL 137 05/29/2021   HDL 62 05/29/2021   LDLCALC 57 05/29/2021   TRIG 97 05/29/2021   CHOLHDL 2.2 05/29/2021    Hepatic Function Latest Ref Rng & Units 05/29/2021 02/21/2021 11/14/2020  Total Protein 6.0 - 8.5  g/dL 7.0 7.4 7.0  Albumin 3.8 - 4.8 g/dL 4.8 5.1(H) 4.6  AST 0 - 40 IU/L 21 22 17   ALT 0 - 44 IU/L 25 24 21   Alk Phosphatase 44 - 121 IU/L 92 107 99  Total Bilirubin 0.0 - 1.2 mg/dL 0.3 0.3 0.6  Bilirubin, Direct 0.0 - 0.3 mg/dL - - -    Lab Results  Component Value Date/Time   TSH 2.130 02/21/2021 08:13 AM   TSH 1.640 01/19/2020 08:20 AM    CBC Latest Ref Rng & Units 05/29/2021 02/21/2021 11/14/2020  WBC 3.4 - 10.8 x10E3/uL 3.5 3.9 3.2(L)  Hemoglobin 13.0 - 17.7 g/dL 14.1 14.8 15.8  Hematocrit 37.5 - 51.0 % 41.3 43.7 49.3  Platelets 150 - 450 x10E3/uL 176 207 192    No results found for: VD25OH  Clinical ASCVD: Yes  The 10-year ASCVD risk score (Arnett DK, et al., 2019) is: 45.7%   Values used to calculate the score:     Age: 12 years     Sex: Male     Is Non-Hispanic African American: Yes     Diabetic: Yes     Tobacco smoker: Yes     Systolic Blood Pressure: 016 mmHg     Is BP treated: Yes     HDL Cholesterol: 62 mg/dL     Total Cholesterol: 137 mg/dL    Depression screen [facility] 2/9 05/29/2021 12/16/2020 04/04/2020  Decreased Interest 0 0 0  Down, Depressed, Hopeless 0 0 0  PHQ - 2 Score 0 0 0  Altered sleeping - 0 -  Tired, decreased energy - 0 -  Change in appetite - 0 -  Feeling bad or failure about yourself  - 0 -  Trouble concentrating - 0 -  Moving slowly or fidgety/restless - 0 -  Suicidal thoughts - 0 -  PHQ-9 Score - 0 -     Other: (CHADS2VASc if Afib, MMRC or CAT for COPD, ACT, DEXA)  Social History   Tobacco Use  Smoking Status Every Day   Packs/day: 1.00   Years: 35.00   Pack years: 35.00   Types: Cigarettes  Smokeless Tobacco Never  Tobacco Comments   1 ppd for > 30 years.   BP Readings from Last 3 Encounters:  05/29/21 134/78  02/21/21 114/80  11/14/20 130/76   Pulse Readings from Last 3 Encounters:  05/29/21 94  02/21/21 84  11/14/20 84   Wt Readings from Last 3 Encounters:  05/29/21 209 lb (94.8 kg)  02/21/21 208 lb (94.3 kg)   11/14/20 209 lb 9.6 oz (95.1 kg)   BMI Readings from Last 3 Encounters:  05/29/21 27.57 kg/m  02/21/21 26.00 kg/m  11/14/20 26.20 kg/m    Assessment/Interventions: Review of patient past medical history, allergies, medications, health status, including review of consultants reports, laboratory and other test data, was performed as part of comprehensive evaluation and provision of chronic care management services.   SDOH:  (Social Determinants of Health) assessments and interventions performed: Yes SDOH Interventions  Flowsheet Row Most Recent Value  SDOH Interventions   Financial Strain Interventions Other (Comment)  [PAP (See CP)]  Transportation Interventions Intervention Not Indicated      SDOH Screenings   Alcohol Screen: Low Risk    Last Alcohol Screening Score (AUDIT): 5  Depression (PHQ2-9): Low Risk    PHQ-2 Score: 0  Financial Resource Strain: High Risk   Difficulty of Paying Living Expenses: Very hard  Food Insecurity: Not on file  Housing: Not on file  Physical Activity: Not on file  Social Connections: Not on file  Stress: Not on file  Tobacco Use: High Risk   Smoking Tobacco Use: Every Day   Smokeless Tobacco Use: Never   Passive Exposure: Not on file  Transportation Needs: No Transportation Needs   Lack of Transportation (Medical): No   Lack of Transportation (Non-Medical): No    CCM Care Plan  No Known Allergies  Medications Reviewed Today     Reviewed by Lane Hacker, [facility] (Pharmacist) on 06/19/21 at [place] List Status: <None>   Medication Order Taking? Sig Documenting Provider Last Dose Status Informant  allopurinol (ZYLOPRIM) 100 MG tablet [id number] Yes Take 1 tablet (100 mg total) by mouth daily. Cox, Kirsten, MD Taking Active   atorvastatin (LIPITOR) 20 MG tablet [id number] Yes Take 1 tablet by mouth once daily Marge Duncans, PA-C Taking Active   colchicine 0.6 MG tablet [id number] Yes Take 1 tablet by mouth twice daily Cox, Kirsten,  MD Taking Active   esomeprazole (NEXIUM) 40 MG capsule [id number] Yes Take 40 mg by mouth daily at 12 noon. [provider] Taking Active   gabapentin (NEURONTIN) 300 MG capsule [id number] Yes Take 1 capsule (300 mg total) by mouth 2 (two) times daily. Cox, Kirsten, MD Taking Active   losartan (COZAAR) 100 MG tablet [id number] Yes Take 1 tablet (100 mg total) by mouth daily. Cox, Kirsten, MD Taking Active   metFORMIN (GLUCOPHAGE) 1000 MG tablet [id number] Yes Take 1 tablet (1,000 mg total) by mouth 2 (two) times daily with a meal. Cox, Kirsten, MD Taking Active   tadalafil (CIALIS) 10 MG tablet [id number]  Take 1 tablet (10 mg total) by mouth daily. Cox, Kirsten, MD  Active   testosterone cypionate (DEPOTESTOSTERONE CYPIONATE) 200 MG/ML injection [id number] Yes Inject 1.25 mLs (250 mg total) into the muscle every 14 (fourteen) days. Cox, Kirsten, MD Taking Active   timolol (TIMOPTIC) 0.25 % ophthalmic solution [id number]  1 drop 2 (two) times daily. [provider]  Active   varenicline (CHANTIX CONTINUING MONTH PAK) 1 MG tablet [id number] No Take 0.5 tablets (0.5 mg total) by mouth daily for 3 days, THEN 0.5 tablets (0.5 mg total) 2 (two) times daily for 4 days, THEN 1 tablet (1 mg total) 2 (two) times daily for 28 days.  Patient not taking: Reported on 06/19/2021   Rochel Brome, MD Not Taking Consider Medication Status and Discontinue             Patient Active Problem List   Diagnosis Date Noted   Diabetic polyneuropathy associated with type 2 diabetes mellitus ([place]) 02/21/2021   Hypogonadism, male 02/21/2021   Nicotine dependence 02/21/2021   Onychomycosis of great toe 02/21/2021   Encounter for screening for lung cancer 09/03/2019   Hyperlipemia    Hypertension associated with diabetes ([place])    Acid reflux 04/03/2016   Myopia of both eyes 08/28/2015   Senile nuclear sclerosis 08/28/2015    Immunization History  Administered Date(s) Administered   [employer]  Quad(high Dose  65+) 01/19/2020, 02/21/2021   PFIZER(Purple Top)SARS-COV-2 Vaccination 06/26/2019, 07/17/2019, 01/27/2020   Pfizer Covid-19 Vaccine Bivalent Booster 46yr & up 05/29/2021   Pneumococcal Conjugate-13 03/19/2018   Pneumococcal Polysaccharide-23 02/21/2017    Conditions to be addressed/monitored:  Hypertension, Hyperlipidemia, Diabetes, and Tobacco use  Care Plan : [place] Updates made by KLane Hacker RAlsensince 06/19/2021 12:00 AM     Problem: Disease State Management   Priority: High  Onset Date: 06/19/2021     Long-Range Goal: DM, Lipids, Smoking   Start Date: 06/19/2021  Expected End Date: 06/19/2022  This Visit's Progress: On track  Priority: High  Note:   Current Barriers:  Unable to independently afford treatment regimen  Pharmacist Clinical Goal(s):  Patient will contact provider office for questions/concerns as evidenced notation of same in electronic health record through collaboration with PharmD and provider.   Interventions: 1:1 collaboration with Cox, KElnita Maxwell MD regarding development and update of comprehensive plan of care as evidenced by provider attestation and co-signature Inter-disciplinary care team collaboration (see longitudinal plan of care) Comprehensive medication review performed; medication list updated in electronic medical record  Hypertension (BP goal <130/80) BP Readings from Last 3 Encounters:  05/29/21 134/78  02/21/21 114/80  11/14/20 130/76  -Not ideally controlled -Current treatment: Losartan 1083mAppropriate, Query effective, Safe, Accessible -Medications previously tried: N/A  -Current home readings:   March 2023: 138/76, usually in that range per patient -Current dietary habits: "Tries to eat healthy" -Current exercise habits: None -Denies hypotensive/hypertensive symptoms -Educated on BP goals and benefits of medications for prevention of heart attack, stroke and kidney damage; -Counseled to monitor BP at home  Daily, document, and provide log at future appointments March 2023: If able to get SGLT2(-), patient BP will most likely lower even more. When I asked patient what his BP was he told me, "Dr. CoTobie Poetaid it's perfect, it's always in the 130's." Will confirm with PCP about Patient's Goal  Hyperlipidemia: (LDL goal < 70) The 10-year ASCVD risk score (Arnett DK, et al., 2019) is: 45.7%   Values used to calculate the score:     Age: [age]     Sex: Male     Is Non-Hispanic African American: Yes     Diabetic: Yes     Tobacco smoker: Yes     Systolic Blood Pressure: 13470mHg     Is BP treated: Yes     HDL Cholesterol: 62 mg/dL     Total Cholesterol: 137 mg/dL Lab Results  Component Value Date   CHOL 137 05/29/2021   CHOL 134 02/21/2021   CHOL 98 (L) 11/14/2020   Lab Results  Component Value Date   HDL 62 05/29/2021   HDL 62 02/21/2021   HDL 42 11/14/2020   Lab Results  Component Value Date   LDLCALC 57 05/29/2021   LDLCALC 47 02/21/2021   LDLCALC 36 11/14/2020   Lab Results  Component Value Date   TRIG 97 05/29/2021   TRIG 150 (H) 02/21/2021   TRIG 111 11/14/2020   Lab Results  Component Value Date   CHOLHDL 2.2 05/29/2021   CHOLHDL 2.2 02/21/2021   CHOLHDL 2.3 11/14/2020  No results found for: LDLDIRECT -Controlled -Current treatment: Atorvastatin 2061mppropriate, Effective, Safe, Accessible -Medications previously tried: N/A  -Current dietary patterns: "Tries to eat healthy" -Current exercise habits: None -Educated on Cholesterol goals;  -Recommended to continue current medication  Diabetes (A1c goal <8%) Lab Results  Component Value Date  HGBA1C 8.0 (H) 05/29/2021   HGBA1C 7.8 (H) 02/21/2021   HGBA1C 9.4 (H) 11/14/2020   Lab Results  Component Value Date   MICROALBUR 0 02/21/2021   LDLCALC 57 05/29/2021   CREATININE 1.01 05/29/2021   Lab Results  Component Value Date   NA 145 (H) 05/29/2021   K 5.0 05/29/2021   CREATININE 1.01 05/29/2021   EGFR 81  05/29/2021   GFRNONAA 66 05/17/2020   GLUCOSE 145 (H) 05/29/2021   Lab Results  Component Value Date   WBC 3.5 05/29/2021   HGB 14.1 05/29/2021   HCT 41.3 05/29/2021   MCV 95 05/29/2021   PLT 176 05/29/2021  -Not ideally controlled -Current medications: Metformin 1035m BID Appropriate, Query effective, Safe, Accessible -Medications previously tried: SBarista(Cost)  -Foot exam Checks daily per patient -Eye Exam Jan 2023 -Current home glucose readings fasting glucose:  March 2023: 120-130's, test in AM post prandial glucose: Doesn't test -Denies hypoglycemic/hyperglycemic symptoms (Last event was a year from 06/19/21 per patient) -Current exercise: N/A -Educated on A1c and blood sugar goals; -Counseled to check feet daily and get yearly eye exams March 2023: Patient unable to afford Synjardy and CCM team was consulted for help with SGLT2(-). Will coordinate with team to get samples and start PAP for Empagliflozin. Spent time counseling patient on sugar goals, he thought it was supposed to be below 150. Counseled that goal is 80-130 but it's OK if you're on the higher side of that range (Which is where he is)  Tobacco use (Goal Stop Smoking) -Uncontrolled -Previous quit attempts: None -Current treatment  None -Patient smokes: Unknown -Patient triggers include: Unknown -On a scale of 1-10, reports MOTIVATION to quit is N/A -On a scale of 1-10, reports CONFIDENCE in quitting is N/A -Provided contact information for [place] [facility]uit [facility] (1-800-QUIT-NOW) and encouraged patient to reach out to this group for support. March 2023: Patient stated he has been smoking 1 PPD for "I can't even recall how long." He states, "I need to quit, I think about it all the time" but he has never tried. The Chantix that was Rx'd Feb 2023 was too expensive. Gave patient the 1800Quitline number and will have patient call as soon as visit is over. Will ask PCP for Bupropion combo as well. Patient was working on  something in the background (Could hear noises of metal being moved around) so unable to go too indepth) and kept trying to end conversation. Will assess need for inhaler or COPD test at next month's visit  Chronic Pain (Neuropathy in feet) -Controlled -Current treatment: Gabapentin 3058mBID Appropriate, Effective, Safe, Accessible -Medications previously tried: N/A  -Pain Scale There were no vitals filed for this visit.  Aggravating Factors: N/A  Pain Type: Neuropathy  March 2023: Patient states the medicine, "Does pretty good" and that he doesn't have much pain. Once in a while he'll have a tingle, but it's not enough to talk about. Will maintain therapy for now. Patient was working on something in the background (Could hear noises of metal being moved around) so unable to go too indepth) and kept trying to end conversation.   Gout (Goal: Prevent gout flares) Lab Results  Component Value Date   LABURIC 4.6 01/19/2020  -Controlled -Last Gout Flare: 4-5 months before 06/19/21 -Current treatment  Allopurinol 10049mppropriate, Effective, Safe, Accessible Colchicine 0.6mg60mpropriate, Effective, Safe, Accessible -Medications previously tried: N/A  -Recommended to continue current medication, patient states he has attacks 1-2x/year and if it starts, he takes  the Colchicine and it immediately knocks it out, he only has to take 1    Patient Goals/Self-Care Activities Patient will:  - take medications as prescribed as evidenced by patient report and record review  Follow Up Plan: The patient has been provided with contact information for the care management team and has been advised to call with any health related questions or concerns.   CPP F/U April 2023  [place] Constable, Sherian Rein.D. - [phone]        Medication Assistance: Application for Empagliflozin  medication assistance program. in process.  Anticipated assistance start date April 2023.  See plan of care for additional  detail.  Compliance/Adherence/Medication fill history: Fill History Chantix            None noted  Gabapentin     02/21/21   90ds Allpurinol         05/13/21   90ds Testosterone   01/23/21 30ds Cialis               07/31/20   30ds Colchicine       05/11/20   7ds Timolol            05/07/21   90ds     Star Rating Drugs:  Medication:                Last Fill:         Day Supply Atorvastatin                 05/02/21            30ds                                     02/07/21          90ds Metformin                    05/13/21            90ds                                     03/06/21          90ds Losartan                      01/19/20            90ds                                     01/17/20            90ds   Care Gaps: Last annual wellness visit?12/16/20 Last eye exam / retinopathy screening?03/27/20 Last diabetic foot exam?05/29/21  Patient's preferred pharmacy is:  [facility], [place] [address] Phone: [phone] Fax: [place], [place] [address] Phone: [phone] Fax: [phone]  Uses pill box? Yes, wife organizes meds for him Pt endorses 100% compliance  We discussed: Did not discuss [facility] at this visit, will at f/u  Care Plan and Follow Up Patient Decision:  Patient agrees to Care Plan and Follow-up.  Plan: The patient has been provided with contact information for the care management team and has been advised to call with any health related questions or concerns.   CPP FU April 2023  [place] Constable, Sherian Rein.D. - [phone]

## 2021-06-19 NOTE — Patient Instructions (Signed)
Visit Information   Goals Addressed             This Visit's Progress    Manage My Medicine       Timeframe:  Long-Range Goal Priority:  High Start Date:                             Expected End Date:                       Follow Up Date 06/19/21   - call for medicine refill 2 or 3 days before it runs out    Why is this important?   These steps will help you keep on track with your medicines.   Notes:      Set My Target A1C-Diabetes Type 2       Timeframe:  Long-Range Goal Priority:  High Start Date:                             Expected End Date:                       Follow Up Date 06/20/22   - set target A1C    Why is this important?   Your target A1C is decided together by you and your doctor.  It is based on several things like your age and other health issues.    Notes:        Patient Care Plan: CCM Pharmacy Care Plan     Problem Identified: Disease State Management   Priority: High  Onset Date: 06/19/2021     Long-Range Goal: DM, Lipids, Smoking   Start Date: 06/19/2021  Expected End Date: 06/19/2022  This Visit's Progress: On track  Priority: High  Note:   Current Barriers:  Unable to independently afford treatment regimen  Pharmacist Clinical Goal(s):  Patient will contact provider office for questions/concerns as evidenced notation of same in electronic health record through collaboration with PharmD and provider.   Interventions: 1:1 collaboration with [facility], Elnita Maxwell, MD regarding development and update of comprehensive plan of care as evidenced by provider attestation and co-signature Inter-disciplinary care team collaboration (see longitudinal plan of care) Comprehensive medication review performed; medication list updated in electronic medical record  Hypertension (BP goal <130/80) BP Readings from Last 3 Encounters:  05/29/21 134/78  02/21/21 114/80  11/14/20 130/76  -Not ideally controlled -Current treatment: Losartan 158m Appropriate,  Query effective, Safe, Accessible -Medications previously tried: N/A  -Current home readings:   March 2023: 138/76, usually in that range per patient -Current dietary habits: "Tries to eat healthy" -Current exercise habits: None -Denies hypotensive/hypertensive symptoms -Educated on BP goals and benefits of medications for prevention of heart attack, stroke and kidney damage; -Counseled to monitor BP at home Daily, document, and provide log at future appointments March 2023: If able to get SGLT2(-), patient BP will most likely lower even more. When I asked patient what his BP was he told me, "Dr. CTobie Poetsaid it's perfect, it's always in the 130's." Will confirm with PCP about Patient's Goal  Hyperlipidemia: (LDL goal < 70) The 10-year ASCVD risk score (Arnett DK, et al., 2019) is: 45.7%   Values used to calculate the score:     Age: 89years     Sex: Male     Is Non-Hispanic African American: Yes  Diabetic: Yes     Tobacco smoker: Yes     Systolic Blood Pressure: 295 mmHg     Is BP treated: Yes     HDL Cholesterol: 62 mg/dL     Total Cholesterol: 137 mg/dL Lab Results  Component Value Date   CHOL 137 05/29/2021   CHOL 134 02/21/2021   CHOL 98 (L) 11/14/2020   Lab Results  Component Value Date   HDL 62 05/29/2021   HDL 62 02/21/2021   HDL 42 11/14/2020   Lab Results  Component Value Date   LDLCALC 57 05/29/2021   LDLCALC 47 02/21/2021   LDLCALC 36 11/14/2020   Lab Results  Component Value Date   TRIG 97 05/29/2021   TRIG 150 (H) 02/21/2021   TRIG 111 11/14/2020   Lab Results  Component Value Date   CHOLHDL 2.2 05/29/2021   CHOLHDL 2.2 02/21/2021   CHOLHDL 2.3 11/14/2020  No results found for: LDLDIRECT -Controlled -Current treatment: Atorvastatin 68m Appropriate, Effective, Safe, Accessible -Medications previously tried: N/A  -Current dietary patterns: "Tries to eat healthy" -Current exercise habits: None -Educated on Cholesterol goals;  -Recommended to  continue current medication  Diabetes (A1c goal <8%) Lab Results  Component Value Date   HGBA1C 8.0 (H) 05/29/2021   HGBA1C 7.8 (H) 02/21/2021   HGBA1C 9.4 (H) 11/14/2020   Lab Results  Component Value Date   MICROALBUR 0 02/21/2021   LDLCALC 57 05/29/2021   CREATININE 1.01 05/29/2021   Lab Results  Component Value Date   NA 145 (H) 05/29/2021   K 5.0 05/29/2021   CREATININE 1.01 05/29/2021   EGFR 81 05/29/2021   GFRNONAA 66 05/17/2020   GLUCOSE 145 (H) 05/29/2021   Lab Results  Component Value Date   WBC 3.5 05/29/2021   HGB 14.1 05/29/2021   HCT 41.3 05/29/2021   MCV 95 05/29/2021   PLT 176 05/29/2021  -Not ideally controlled -Current medications: Metformin 10015mBID Appropriate, Query effective, Safe, Accessible -Medications previously tried: Sy[employer]istaCost)  -Foot exam Checks daily per patient -Eye Exam Jan 2023 -Current home glucose readings fasting glucose:  March 2023: 120-130's, test in AM post prandial glucose: Doesn't test -Denies hypoglycemic/hyperglycemic symptoms (Last event was a year from 06/19/21 per patient) -Current exercise: N/A -Educated on A1c and blood sugar goals; -Counseled to check feet daily and get yearly eye exams March 2023: Patient unable to afford Synjardy and CCM team was consulted for help with SGLT2(-). Will coordinate with team to get samples and start PAP for Empagliflozin. Spent time counseling patient on sugar goals, he thought it was supposed to be below 150. Counseled that goal is 80-130 but it's OK if you're on the higher side of that range (Which is where he is)  Tobacco use (Goal Stop Smoking) -Uncontrolled -Previous quit attempts: None -Current treatment  None -Patient smokes: Unknown -Patient triggers include: Unknown -On a scale of 1-10, reports MOTIVATION to quit is N/A -On a scale of 1-10, reports CONFIDENCE in quitting is N/A -Provided contact information for [place] Quit Line (1-800-QUIT-NOW) and encouraged patient  to reach out to this group for support. March 2023: Patient stated he has been smoking 1 PPD for "I can't even recall how long." He states, "I need to quit, I think about it all the time" but he has never tried. The Chantix that was Rx'd Feb 2023 was too expensive. Gave patient the 1800Quitline number and will have patient call as soon as visit is over. Will ask PCP for Bupropion  combo as well. Patient was working on something in the background (Could hear noises of metal being moved around) so unable to go too indepth) and kept trying to end conversation. Will assess need for inhaler or COPD test at next month's visit  Chronic Pain (Neuropathy in feet) -Controlled -Current treatment: Gabapentin 317m BID Appropriate, Effective, Safe, Accessible -Medications previously tried: N/A  -Pain Scale There were no vitals filed for this visit.  Aggravating Factors: N/A  Pain Type: Neuropathy  March 2023: Patient states the medicine, "Does pretty good" and that he doesn't have much pain. Once in a while he'll have a tingle, but it's not enough to talk about. Will maintain therapy for now. Patient was working on something in the background (Could hear noises of metal being moved around) so unable to go too indepth) and kept trying to end conversation.   Gout (Goal: Prevent gout flares) Lab Results  Component Value Date   LABURIC 4.6 01/19/2020  -Controlled -Last Gout Flare: 4-5 months before 06/19/21 -Current treatment  Allopurinol 105mAppropriate, Effective, Safe, Accessible Colchicine 0.59m64mppropriate, Effective, Safe, Accessible -Medications previously tried: N/A  -Recommended to continue current medication, patient states he has attacks 1-2x/year and if it starts, he takes the Colchicine and it immediately knocks it out, he only has to take 1    Patient Goals/Self-Care Activities Patient will:  - take medications as prescribed as evidenced by patient report and record review  Follow Up  Plan: The patient has been provided with contact information for the care management team and has been advised to call with any health related questions or concerns.   CPP F/U April 2023  Nathaniel Nicholson - [phone]       Nathaniel Nicholson given information about Chronic Care Management services today including:  CCM service includes personalized support from designated clinical staff supervised by his physician, including individualized plan of care and coordination with other care providers 24/7 contact phone numbers for assistance for urgent and routine care needs. Standard insurance, coinsurance, copays and deductibles apply for chronic care management only during months in which we provide at least 20 minutes of these services. Most insurances cover these services at 100%, however patients may be responsible for any copay, coinsurance and/or deductible if applicable. This service may help you avoid the need for more expensive face-to-face services. Only one practitioner may furnish and bill the service in a calendar month. The patient may stop CCM services at any time (effective at the end of the month) by phone call to the office staff.  Patient agreed to services and verbal consent obtained.   The patient verbalized understanding of instructions, educational materials, and care plan provided today and declined offer to receive copy of patient instructions, educational materials, and care plan.  The pharmacy team will reach out to the patient again over the next 30 days.   Nathaniel HackerPHDesert Palms

## 2021-06-20 ENCOUNTER — Telehealth: Payer: Self-pay

## 2021-06-20 NOTE — Progress Notes (Signed)
? ? ?  Chronic Care Management ?Pharmacy Assistant  ? ?Name: Nathaniel Nicholson  MRN: [id number] DOB: [birth year] ? ? ?Reason for Encounter: PAP documentation  ? ?06/20/21- PAP for Empagliflozin has been filled out through [facility] and uploaded to folder to be mailed out to pt. Left a detailed message to inform pt of next instructions.  ?  ? ?Medications: ?Outpatient Encounter Medications as of 06/20/2021  ?Medication Sig  ? allopurinol (ZYLOPRIM) 100 MG tablet Take 1 tablet (100 mg total) by mouth daily.  ? atorvastatin (LIPITOR) 20 MG tablet Take 1 tablet by mouth once daily  ? colchicine 0.6 MG tablet Take 1 tablet by mouth twice daily  ? esomeprazole (NEXIUM) 40 MG capsule Take 40 mg by mouth daily at 12 noon.  ? gabapentin (NEURONTIN) 300 MG capsule Take 1 capsule (300 mg total) by mouth 2 (two) times daily.  ? losartan (COZAAR) 100 MG tablet Take 1 tablet (100 mg total) by mouth daily.  ? metFORMIN (GLUCOPHAGE) 1000 MG tablet Take 1 tablet (1,000 mg total) by mouth 2 (two) times daily with a meal.  ? tadalafil (CIALIS) 10 MG tablet Take 1 tablet (10 mg total) by mouth daily.  ? testosterone cypionate (DEPOTESTOSTERONE CYPIONATE) 200 MG/ML injection Inject 1.25 mLs (250 mg total) into the muscle every 14 (fourteen) days.  ? timolol (TIMOPTIC) 0.25 % ophthalmic solution 1 drop 2 (two) times daily.  ? varenicline (CHANTIX CONTINUING MONTH PAK) 1 MG tablet Take 0.5 tablets (0.5 mg total) by mouth daily for 3 days, THEN 0.5 tablets (0.5 mg total) 2 (two) times daily for 4 days, THEN 1 tablet (1 mg total) 2 (two) times daily for 28 days. (Patient not taking: Reported on 06/19/2021)  ? ?No facility-administered encounter medications on file as of 06/20/2021.  ? ? ?Roxana Hires, CMA ?Clinical Pharmacist Assistant  ?[phone]  ?

## 2021-07-06 ENCOUNTER — Telehealth (HOSPITAL_BASED_OUTPATIENT_CLINIC_OR_DEPARTMENT_OTHER): Payer: Self-pay

## 2021-07-11 DIAGNOSIS — B351 Tinea unguium: Secondary | ICD-10-CM | POA: Diagnosis not present

## 2021-07-11 DIAGNOSIS — E1142 Type 2 diabetes mellitus with diabetic polyneuropathy: Secondary | ICD-10-CM | POA: Diagnosis not present

## 2021-07-11 DIAGNOSIS — I872 Venous insufficiency (chronic) (peripheral): Secondary | ICD-10-CM | POA: Diagnosis not present

## 2021-07-13 DIAGNOSIS — I10 Essential (primary) hypertension: Secondary | ICD-10-CM

## 2021-07-13 DIAGNOSIS — E785 Hyperlipidemia, unspecified: Secondary | ICD-10-CM

## 2021-07-13 DIAGNOSIS — R69 Illness, unspecified: Secondary | ICD-10-CM | POA: Diagnosis not present

## 2021-07-13 DIAGNOSIS — E1159 Type 2 diabetes mellitus with other circulatory complications: Secondary | ICD-10-CM

## 2021-07-13 DIAGNOSIS — E1142 Type 2 diabetes mellitus with diabetic polyneuropathy: Secondary | ICD-10-CM | POA: Diagnosis not present

## 2021-07-13 DIAGNOSIS — Z7984 Long term (current) use of oral hypoglycemic drugs: Secondary | ICD-10-CM | POA: Diagnosis not present

## 2021-07-13 DIAGNOSIS — F17210 Nicotine dependence, cigarettes, uncomplicated: Secondary | ICD-10-CM

## 2021-07-19 ENCOUNTER — Telehealth: Payer: Self-pay

## 2021-07-19 NOTE — Progress Notes (Signed)
Left vm reminding pt of upcoming appt with CPP on 4/11 ? ?Roxana Hires, CMA ?Clinical Pharmacist Assistant  ?[phone]  ?

## 2021-07-24 ENCOUNTER — Telehealth: Payer: Self-pay

## 2021-07-24 ENCOUNTER — Telehealth: Payer: Medicare HMO

## 2021-07-24 NOTE — Telephone Encounter (Signed)
?  Care Management  ? ?Follow Up Note ? ? ?07/24/2021 ?Name: Nathaniel Nicholson MRN: [id number] DOB: 02-24-1951 ? ? ?Referred by: Blane Ohara, MD ?Reason for referral : Chronic Care Management ? ? ?An unsuccessful telephone outreach was attempted today. The patient was referred to the case management team for assistance with care management and care coordination.  ? ?Follow Up Plan: The patient has been provided with contact information for the care management team and has been advised to call with any health related questions or concerns.  ? ?Artelia Laroche, Pharm.D. - [phone] ? ? ?

## 2021-07-30 ENCOUNTER — Telehealth: Payer: Self-pay

## 2021-07-30 NOTE — Chronic Care Management (AMB) (Signed)
I called pt and lvm to return my call to r/s CCM F/U with CPP ? ?Roxana Hires, CMA ?Clinical Pharmacist Assistant  ?[phone]  ?

## 2021-08-07 ENCOUNTER — Other Ambulatory Visit: Payer: Self-pay | Admitting: Family Medicine

## 2021-08-07 DIAGNOSIS — M1A0610 Idiopathic chronic gout, right knee, without tophus (tophi): Secondary | ICD-10-CM

## 2021-08-25 ENCOUNTER — Other Ambulatory Visit: Payer: Self-pay | Admitting: Physician Assistant

## 2021-08-25 DIAGNOSIS — E782 Mixed hyperlipidemia: Secondary | ICD-10-CM

## 2021-08-28 NOTE — Progress Notes (Signed)
? ?Subjective:  ?Patient ID: Nathaniel Nicholson, male    DOB: Mar 27, 1951  Age: 71 y.o. MRN: [id number] ? ?Chief Complaint  ?Patient presents with  ? Diabetes  ? Hyperlipidemia  ? ?HPI: ?Diabetes:  ?Complications: neuropathy ?Glucose checking: daily ?Glucose logs: 120-140. Occasional 160-170. ?Hypoglycemia: no  ?Most recent A1C: 8.0 ?Current medications: metformin 1000 mg one twice daily, gabapentin 300 mg twice daily  ?Last Eye Exam: Jan 2023. Glaucoma: eye drops. ?Foot checks: daily ?  ?Hyperlipidemia: ?Current medications: lipitor 20 mg daily.  ?  ?Hypertension: ?Current medications:  Losartan 100 mg daily  ?  ?Diet: healthy ?Exercise: walk.  ?  ?Gout:  Allopurinol 100 mg daily uses colchicine for gout flares. Infrequent flares. ?GERD: on nexium 40 mg once daily. Well controlled.  ?Hypogonadism: ON testosterone 250 mg every 14 days. Helping with  ?Tobacco: decreased 1/2 ppd. ? ?Above HPI reviewed and updated.  ?Current Outpatient Medications on File Prior to Visit  ?Medication Sig Dispense Refill  ? allopurinol (ZYLOPRIM) 100 MG tablet Take 1 tablet by mouth once daily 90 tablet 0  ? atorvastatin (LIPITOR) 20 MG tablet Take 1 tablet by mouth once daily 90 tablet 0  ? colchicine 0.6 MG tablet Take 1 tablet by mouth twice daily 14 tablet 1  ? esomeprazole (NEXIUM) 40 MG capsule Take 40 mg by mouth daily at 12 noon.    ? gabapentin (NEURONTIN) 300 MG capsule Take 1 capsule (300 mg total) by mouth 2 (two) times daily. 180 capsule 1  ? losartan (COZAAR) 100 MG tablet Take 1 tablet (100 mg total) by mouth daily. 90 tablet 1  ? metFORMIN (GLUCOPHAGE) 1000 MG tablet TAKE 1 TABLET BY MOUTH TWICE DAILY WITH A MEAL 180 tablet 0  ? tadalafil (CIALIS) 10 MG tablet Take 1 tablet (10 mg total) by mouth daily. 30 tablet 3  ? timolol (TIMOPTIC) 0.25 % ophthalmic solution 1 drop 2 (two) times daily.    ? ?No current facility-administered medications on file prior to visit.  ? ?Past Medical History:  ?Diagnosis Date  ? Diabetes mellitus  without complication (HCC)   ? GERD (gastroesophageal reflux disease)   ? Hyperlipemia   ? Hypertension   ? Nicotine dependence, cigarettes, uncomplicated   ? Other male erectile dysfunction   ? Peptic ulcer 2000  ? Testicular hypofunction   ? Vitamin D deficiency   ? ?Past Surgical History:  ?Procedure Laterality Date  ? CATARACT EXTRACTION Left 2017  ?  ?Family History  ?Problem Relation Age of Onset  ? Cancer Father   ?     lung  ? Cancer Sister   ?     lung  ? Cancer Brother   ?     throat  ? Hypertension Other   ? Diabetes Other   ? ?Social History  ? ?Socioeconomic History  ? Marital status: Married  ?  Spouse name: Not on file  ? Number of children: Not on file  ? Years of education: Not on file  ? Highest education level: Not on file  ?Occupational History  ? Occupation: Marine scientist  ?Tobacco Use  ? Smoking status: Every Day  ?  Packs/day: 1.00  ?  Years: 35.00  ?  Pack years: 35.00  ?  Types: Cigarettes  ? Smokeless tobacco: Never  ? Tobacco comments:  ?  1 ppd for > 30 years.  ?Substance and Sexual Activity  ? Alcohol use: Yes  ?  Alcohol/week: 28.0 standard drinks  ?  Types: 28 Cans  of beer per week  ?  Comment: 4/daily  ? Drug use: No  ? Sexual activity: Not on file  ?Other Topics Concern  ? Not on file  ?Social History Narrative  ? Not on file  ? ?Social Determinants of Health  ? ?Financial Resource Strain: High Risk  ? Difficulty of Paying Living Expenses: Very hard  ?Food Insecurity: Not on file  ?Transportation Needs: No Transportation Needs  ? Lack of Transportation (Medical): No  ? Lack of Transportation (Non-Medical): No  ?Physical Activity: Not on file  ?Stress: Not on file  ?Social Connections: Not on file  ? ? ?Review of Systems  ?Constitutional:  Negative for appetite change, fatigue and fever.  ?HENT:  Negative for congestion, ear pain, sinus pressure and sore throat.   ?Respiratory:  Negative for cough, chest tightness, shortness of breath and wheezing.   ?Cardiovascular:  Negative for  chest pain and palpitations.  ?Gastrointestinal:  Negative for abdominal pain, constipation, diarrhea, nausea and vomiting.  ?Endocrine: Negative for polydipsia and polyphagia.  ?Genitourinary:  Negative for dysuria and hematuria.  ?Musculoskeletal:  Negative for arthralgias (left knee pain), back pain, joint swelling and myalgias.  ?Skin:  Negative for rash.  ?Neurological:  Negative for dizziness, weakness and headaches.  ?Psychiatric/Behavioral:  Negative for dysphoric mood. The patient is not nervous/anxious.   ? ? ?Objective:  ?BP 124/84   Pulse 84   Temp (!) 97.3 ?F (36.3 ?C)   Resp 16   Ht 6\' 2"  (1.88 m)   Wt 212 lb (96.2 kg)   BMI 27.22 kg/m?  ? ? ?  08/29/2021  ?  7:40 AM 05/29/2021  ?  8:17 AM 05/29/2021  ?  8:10 AM  ?BP/Weight  ?Systolic BP 124 134 144  ?Diastolic BP 84 78 84  ?Wt. (Lbs) 212  209  ?BMI 27.22 kg/m2  27.57 kg/m2  ? ? ?Physical Exam ?Vitals reviewed.  ?Constitutional:   ?   Appearance: Normal appearance. He is normal weight.  ?Neck:  ?   Vascular: No carotid bruit.  ?Cardiovascular:  ?   Rate and Rhythm: Normal rate and regular rhythm.  ?   Pulses: Normal pulses.  ?   Heart sounds: Normal heart sounds.  ?Pulmonary:  ?   Effort: Pulmonary effort is normal.  ?   Breath sounds: Normal breath sounds.  ?Abdominal:  ?   General: Abdomen is flat. Bowel sounds are normal.  ?   Palpations: Abdomen is soft.  ?Neurological:  ?   Mental Status: He is alert and oriented to person, place, and time.  ?Psychiatric:     ?   Mood and Affect: Mood normal.     ?   Behavior: Behavior normal.  ? ? ?Diabetic Foot Exam - Simple   ?Simple Foot Form ?Diabetic Foot exam was performed with the following findings: Yes 08/29/2021  8:09 AM  ?Visual Inspection ?See comments: Yes ?Sensation Testing ?See comments: Yes ?Pulse Check ?Posterior Tibialis and Dorsalis pulse intact bilaterally: Yes ?Comments ?Toenails thickened.  ?Decreaed sensation bl feet.  ?  ?  ? ?Lab Results  ?Component Value Date  ? WBC 3.5 05/29/2021  ?  HGB 14.1 05/29/2021  ? HCT 41.3 05/29/2021  ? PLT 176 05/29/2021  ? GLUCOSE 145 (H) 05/29/2021  ? CHOL 137 05/29/2021  ? TRIG 97 05/29/2021  ? HDL 62 05/29/2021  ? LDLCALC 57 05/29/2021  ? ALT 25 05/29/2021  ? AST 21 05/29/2021  ? NA 145 (H) 05/29/2021  ? K 5.0  05/29/2021  ? CL 107 (H) 05/29/2021  ? CREATININE 1.01 05/29/2021  ? BUN 14 05/29/2021  ? CO2 25 05/29/2021  ? TSH 2.130 02/21/2021  ? HGBA1C 8.0 (H) 05/29/2021  ? MICROALBUR 0 02/21/2021  ? ? ? ? ?Assessment & Plan:  ? ?Problem List Items Addressed This Visit   ? ?  ? Cardiovascular and Mediastinum  ? Hypertension associated with diabetes (HCC)  ?  Well controlled.  ?No changes to medicines.  ?Continue to work on eating a healthy diet and exercise.  ?Labs drawn today.  ?  ?  ? Relevant Orders  ? Comprehensive metabolic panel  ?  ? Digestive  ? Acid reflux  ?  Well controlled.  ?No changes to medicines.  ?Continue to work on eating a healthy diet and exercise.  ?Labs drawn today.  ?  ?  ?  ? Endocrine  ? Diabetic polyneuropathy associated with type 2 diabetes mellitus (HCC)  ?  Well controlled.  ?No changes to medicines.  ?Continue to work on eating a healthy diet and exercise.  ?Labs drawn today.  ?  ?  ? Relevant Orders  ? Hemoglobin A1c  ? Hypogonadism, male  ?  The current medical regimen is effective;  continue present plan and medications. ? ?  ?  ? Relevant Medications  ? testosterone cypionate (DEPOTESTOSTERONE CYPIONATE) 200 MG/ML injection  ?  ? Other  ? Hyperlipemia - Primary  ?  Well controlled.  ?No changes to medicines.  ?Continue to work on eating a healthy diet and exercise.  ?Labs drawn today.  ?  ?  ? Relevant Orders  ? CBC With Diff/Platelet  ? Lipid panel  ? Encounter for screening for lung cancer  ?  Call to reschedule Low Dose CT of chest. ? ?  ?  ? Nicotine dependence  ?  Continue to try and quit smoking. ? ?  ?  ? Need for hepatitis C screening test  ?  Labs drawn. ? ?  ?  ? Relevant Orders  ? HCV Ab w Reflex to Quant PCR  ?. ? ?Meds  ordered this encounter  ?Medications  ? testosterone cypionate (DEPOTESTOSTERONE CYPIONATE) 200 MG/ML injection  ?  Sig: Inject 1.25 mLs (250 mg total) into the muscle every 14 (fourteen) days.  ?  Dispense:

## 2021-08-29 ENCOUNTER — Encounter: Payer: Self-pay | Admitting: Family Medicine

## 2021-08-29 ENCOUNTER — Ambulatory Visit (INDEPENDENT_AMBULATORY_CARE_PROVIDER_SITE_OTHER): Payer: Medicare HMO | Admitting: Family Medicine

## 2021-08-29 VITALS — BP 124/84 | HR 84 | Temp 97.3°F | Resp 16 | Ht 74.0 in | Wt 212.0 lb

## 2021-08-29 DIAGNOSIS — E291 Testicular hypofunction: Secondary | ICD-10-CM

## 2021-08-29 DIAGNOSIS — I152 Hypertension secondary to endocrine disorders: Secondary | ICD-10-CM | POA: Diagnosis not present

## 2021-08-29 DIAGNOSIS — E782 Mixed hyperlipidemia: Secondary | ICD-10-CM | POA: Diagnosis not present

## 2021-08-29 DIAGNOSIS — Z122 Encounter for screening for malignant neoplasm of respiratory organs: Secondary | ICD-10-CM

## 2021-08-29 DIAGNOSIS — K219 Gastro-esophageal reflux disease without esophagitis: Secondary | ICD-10-CM | POA: Diagnosis not present

## 2021-08-29 DIAGNOSIS — Z1159 Encounter for screening for other viral diseases: Secondary | ICD-10-CM

## 2021-08-29 DIAGNOSIS — F17218 Nicotine dependence, cigarettes, with other nicotine-induced disorders: Secondary | ICD-10-CM

## 2021-08-29 DIAGNOSIS — E1159 Type 2 diabetes mellitus with other circulatory complications: Secondary | ICD-10-CM | POA: Diagnosis not present

## 2021-08-29 DIAGNOSIS — E1142 Type 2 diabetes mellitus with diabetic polyneuropathy: Secondary | ICD-10-CM

## 2021-08-29 DIAGNOSIS — R69 Illness, unspecified: Secondary | ICD-10-CM | POA: Diagnosis not present

## 2021-08-29 MED ORDER — TESTOSTERONE CYPIONATE 200 MG/ML IM SOLN: 250.0000 mg | INTRAMUSCULAR | 5 refills | Status: DC

## 2021-08-29 NOTE — Assessment & Plan Note (Signed)
Well controlled.  ?No changes to medicines.  ?Continue to work on eating a healthy diet and exercise.  ?Labs drawn today.  ?

## 2021-08-29 NOTE — Assessment & Plan Note (Signed)
Labs drawn

## 2021-08-29 NOTE — Assessment & Plan Note (Signed)
Call to reschedule Low Dose CT of chest. ?

## 2021-08-29 NOTE — Patient Instructions (Addendum)
Call to reschedule lung ct scan.  ?Pick up testosterone.  ?Do COLOGUARD (POOP TEST) as soon as it comes to you. ?Continue to work on quitting smoking.  ? ?

## 2021-08-29 NOTE — Assessment & Plan Note (Signed)
Continue to try and quit smoking. ?

## 2021-08-29 NOTE — Assessment & Plan Note (Signed)
The current medical regimen is effective;  continue present plan and medications.  

## 2021-08-29 NOTE — Addendum Note (Signed)
Addended by: Humberto Leep on: 08/29/2021 10:45 AM ? ? Modules accepted: Orders ? ?

## 2021-08-30 LAB — CBC WITH DIFF/PLATELET
Basophils Absolute: 0 10*3/uL (ref 0.0–0.2)
Basos: 0 %
EOS (ABSOLUTE): 0.1 10*3/uL (ref 0.0–0.4)
Eos: 4 %
Hematocrit: 40.7 % (ref 37.5–51.0)
Hemoglobin: 13.9 g/dL (ref 13.0–17.7)
Immature Grans (Abs): 0 10*3/uL (ref 0.0–0.1)
Immature Granulocytes: 0 %
Lymphocytes Absolute: 1.4 10*3/uL (ref 0.7–3.1)
Lymphs: 46 %
MCH: 33.2 pg — ABNORMAL HIGH (ref 26.6–33.0)
MCHC: 34.2 g/dL (ref 31.5–35.7)
MCV: 97 fL (ref 79–97)
Monocytes Absolute: 0.3 10*3/uL (ref 0.1–0.9)
Monocytes: 11 %
Neutrophils Absolute: 1.2 10*3/uL — ABNORMAL LOW (ref 1.4–7.0)
Neutrophils: 39 %
Platelets: 176 10*3/uL (ref 150–450)
RBC: 4.19 x10E6/uL (ref 4.14–5.80)
RDW: 11.7 % (ref 11.6–15.4)
WBC: 3.1 10*3/uL — ABNORMAL LOW (ref 3.4–10.8)

## 2021-08-30 LAB — COMPREHENSIVE METABOLIC PANEL
ALT: 35 IU/L (ref 0–44)
AST: 23 IU/L (ref 0–40)
Albumin/Globulin Ratio: 2 (ref 1.2–2.2)
Albumin: 4.7 g/dL (ref 3.8–4.8)
Alkaline Phosphatase: 94 IU/L (ref 44–121)
BUN/Creatinine Ratio: 15 (ref 10–24)
BUN: 16 mg/dL (ref 8–27)
Bilirubin Total: 0.4 mg/dL (ref 0.0–1.2)
CO2: 25 mmol/L (ref 20–29)
Calcium: 9.7 mg/dL (ref 8.6–10.2)
Chloride: 98 mmol/L (ref 96–106)
Creatinine, Ser: 1.05 mg/dL (ref 0.76–1.27)
Globulin, Total: 2.4 g/dL (ref 1.5–4.5)
Glucose: 191 mg/dL — ABNORMAL HIGH (ref 70–99)
Potassium: 4.9 mmol/L (ref 3.5–5.2)
Sodium: 137 mmol/L (ref 134–144)
Total Protein: 7.1 g/dL (ref 6.0–8.5)
eGFR: 77 mL/min/{1.73_m2} (ref >59)

## 2021-08-30 LAB — HCV AB W REFLEX TO QUANT PCR: HCV Ab: NONREACTIVE

## 2021-08-30 LAB — MICROALBUMIN / CREATININE URINE RATIO
Creatinine, Urine: 52.3 mg/dL
Microalb/Creat Ratio: 46 mg/g creat — ABNORMAL HIGH (ref 0–29)
Microalbumin, Urine: 24.1 ug/mL

## 2021-08-30 LAB — HCV INTERPRETATION

## 2021-08-30 LAB — LIPID PANEL
Chol/HDL Ratio: 2.8 ratio (ref 0.0–5.0)
Cholesterol, Total: 136 mg/dL (ref 100–199)
HDL: 48 mg/dL (ref >39)
LDL Chol Calc (NIH): 53 mg/dL (ref 0–99)
Triglycerides: 218 mg/dL — ABNORMAL HIGH (ref 0–149)
VLDL Cholesterol Cal: 35 mg/dL (ref 5–40)

## 2021-08-30 LAB — HEMOGLOBIN A1C
Est. average glucose Bld gHb Est-mCnc: 180 mg/dL
Hgb A1c MFr Bld: 7.9 % — ABNORMAL HIGH (ref 4.8–5.6)

## 2021-08-30 LAB — CARDIOVASCULAR RISK ASSESSMENT

## 2021-08-31 NOTE — Progress Notes (Signed)
Blood count abnormal. Wbc little low. Intermittenlty has been low in the past.  Liver function normal.  Kidney function normal.  Cholesterol: trigs 218. Rest of panel good.  HBA1C: 7.9.. minimally changed. My preferred recommendation would be farxiga 5 mg daily. If refuses, I would recommend actos 15 mg daily.  Spilling protein in urine.  Hepatitis c negative.

## 2021-09-03 ENCOUNTER — Other Ambulatory Visit: Payer: Self-pay

## 2021-09-03 DIAGNOSIS — E1142 Type 2 diabetes mellitus with diabetic polyneuropathy: Secondary | ICD-10-CM

## 2021-09-03 DIAGNOSIS — E1159 Type 2 diabetes mellitus with other circulatory complications: Secondary | ICD-10-CM

## 2021-09-03 MED ORDER — LOSARTAN POTASSIUM 100 MG PO TABS: 100.0000 mg | ORAL_TABLET | Freq: Every day | ORAL | 1 refills | Status: DC

## 2021-09-03 MED ORDER — DAPAGLIFLOZIN PROPANEDIOL 5 MG PO TABS: 5.0000 mg | ORAL_TABLET | Freq: Every day | ORAL | 1 refills | Status: DC

## 2021-09-12 ENCOUNTER — Encounter: Payer: Self-pay | Admitting: Family Medicine

## 2021-09-12 ENCOUNTER — Ambulatory Visit (INDEPENDENT_AMBULATORY_CARE_PROVIDER_SITE_OTHER): Payer: Medicare HMO | Admitting: Family Medicine

## 2021-09-12 VITALS — BP 120/90 | HR 88 | Temp 97.5°F | Resp 18 | Wt 212.0 lb

## 2021-09-12 DIAGNOSIS — J018 Other acute sinusitis: Secondary | ICD-10-CM

## 2021-09-12 DIAGNOSIS — J208 Acute bronchitis due to other specified organisms: Secondary | ICD-10-CM

## 2021-09-12 LAB — POC COVID19 BINAXNOW: SARS Coronavirus 2 Ag: NEGATIVE

## 2021-09-12 MED ORDER — BENZONATATE 200 MG PO CAPS: 200.0000 mg | ORAL_CAPSULE | Freq: Three times a day (TID) | ORAL | 0 refills | Status: DC | PRN

## 2021-09-12 MED ORDER — AMOXICILLIN-POT CLAVULANATE 875-125 MG PO TABS: 1.0000 | ORAL_TABLET | Freq: Two times a day (BID) | ORAL | 0 refills | Status: DC

## 2021-09-12 NOTE — Progress Notes (Signed)
Acute Office Visit  Subjective:    Patient ID: Nathaniel Nicholson, male    DOB: 1950-07-17, 71 y.o.   MRN: [id number]  Chief Complaint  Patient presents with   Cough    HPI: Patient is in today for cough, chest congestion, and wheezing. His symptoms started 3 days ago with a slight cough but seem to be getting worse.   Past Medical History:  Diagnosis Date   Diabetes mellitus without complication (HCC)    GERD (gastroesophageal reflux disease)    Hyperlipemia    Hypertension    Nicotine dependence, cigarettes, uncomplicated    Other male erectile dysfunction    Peptic ulcer 2000   Testicular hypofunction    Vitamin D deficiency     Past Surgical History:  Procedure Laterality Date   CATARACT EXTRACTION Left 2017    Family History  Problem Relation Age of Onset   Cancer Father        lung   Cancer Sister        lung   Cancer Brother        throat   Hypertension Other    Diabetes Other     Social History   Socioeconomic History   Marital status: Married    Spouse name: Not on file   Number of children: Not on file   Years of education: Not on file   Highest education level: Not on file  Occupational History   Occupation: Nurse, learning disability  Tobacco Use   Smoking status: Every Day    Packs/day: 1.00    Years: 35.00    Pack years: 35.00    Types: Cigarettes   Smokeless tobacco: Never   Tobacco comments:    1 ppd for > 30 years.  Substance and Sexual Activity   Alcohol use: Yes    Alcohol/week: 28.0 standard drinks    Types: 28 Cans of beer per week    Comment: 4/daily   Drug use: No   Sexual activity: Not on file  Other Topics Concern   Not on file  Social History Narrative   Not on file   Social Determinants of Health   Financial Resource Strain: High Risk   Difficulty of Paying Living Expenses: Very hard  Food Insecurity: Not on file  Transportation Needs: No Transportation Needs   Lack of Transportation (Medical): No   Lack of  Transportation (Non-Medical): No  Physical Activity: Not on file  Stress: Not on file  Social Connections: Not on file  Intimate Partner Violence: Not on file    Outpatient Medications Prior to Visit  Medication Sig Dispense Refill   allopurinol (ZYLOPRIM) 100 MG tablet Take 1 tablet by mouth once daily 90 tablet 0   atorvastatin (LIPITOR) 20 MG tablet Take 1 tablet by mouth once daily 90 tablet 0   colchicine 0.6 MG tablet Take 1 tablet by mouth twice daily 14 tablet 1   dapagliflozin propanediol (FARXIGA) 5 MG TABS tablet Take 1 tablet (5 mg total) by mouth daily before breakfast. 90 tablet 1   esomeprazole (NEXIUM) 40 MG capsule Take 40 mg by mouth daily at 12 noon.     gabapentin (NEURONTIN) 300 MG capsule Take 1 capsule (300 mg total) by mouth 2 (two) times daily. 180 capsule 1   losartan (COZAAR) 100 MG tablet Take 1 tablet (100 mg total) by mouth daily. 90 tablet 1   metFORMIN (GLUCOPHAGE) 1000 MG tablet TAKE 1 TABLET BY MOUTH TWICE DAILY WITH A MEAL 180 tablet  0   tadalafil (CIALIS) 10 MG tablet Take 1 tablet (10 mg total) by mouth daily. 30 tablet 3   testosterone cypionate (DEPOTESTOSTERONE CYPIONATE) 200 MG/ML injection Inject 1.25 mLs (250 mg total) into the muscle every 14 (fourteen) days. 3 mL 5   timolol (TIMOPTIC) 0.25 % ophthalmic solution 1 drop 2 (two) times daily.     No facility-administered medications prior to visit.    No Known Allergies  Review of Systems  Constitutional:  Positive for fatigue. Negative for chills and fever.  HENT:  Positive for congestion.   Respiratory:  Positive for cough, chest tightness, shortness of breath and wheezing.   Cardiovascular:  Negative for chest pain.  Neurological:  Positive for headaches.      Objective:    Physical Exam Constitutional:      Appearance: Normal appearance.  HENT:     Right Ear: Tympanic membrane, ear canal and external ear normal.     Left Ear: Tympanic membrane, ear canal and external ear normal.      Nose: Congestion present. No rhinorrhea.     Mouth/Throat:     Mouth: Mucous membranes are moist.     Pharynx: Posterior oropharyngeal erythema present. No oropharyngeal exudate.  Cardiovascular:     Rate and Rhythm: Normal rate and regular rhythm.     Heart sounds: Normal heart sounds.  Pulmonary:     Effort: Pulmonary effort is normal. No respiratory distress.     Breath sounds: Normal breath sounds. No wheezing, rhonchi or rales.  Lymphadenopathy:     Cervical: No cervical adenopathy.  Neurological:     Mental Status: He is alert and oriented to person, place, and time.  Psychiatric:        Mood and Affect: Mood normal.        Behavior: Behavior normal.    There were no vitals taken for this visit. Wt Readings from Last 3 Encounters:  08/29/21 212 lb (96.2 kg)  05/29/21 209 lb (94.8 kg)  02/21/21 208 lb (94.3 kg)    Health Maintenance Due  Topic Date Due   TETANUS/TDAP  Never done   Fecal DNA (Cologuard)  Never done   Zoster Vaccines- Shingrix (1 of 2) Never done   OPHTHALMOLOGY EXAM  03/27/2021    There are no preventive care reminders to display for this patient.   Lab Results  Component Value Date   TSH 2.130 02/21/2021   Lab Results  Component Value Date   WBC 3.1 (L) 08/29/2021   HGB 13.9 08/29/2021   HCT 40.7 08/29/2021   MCV 97 08/29/2021   PLT 176 08/29/2021   Lab Results  Component Value Date   NA 137 08/29/2021   K 4.9 08/29/2021   CO2 25 08/29/2021   GLUCOSE 191 (H) 08/29/2021   BUN 16 08/29/2021   CREATININE 1.05 08/29/2021   BILITOT 0.4 08/29/2021   ALKPHOS 94 08/29/2021   AST 23 08/29/2021   ALT 35 08/29/2021   PROT 7.1 08/29/2021   ALBUMIN 4.7 08/29/2021   CALCIUM 9.7 08/29/2021   ANIONGAP 11 01/12/2018   EGFR 77 08/29/2021   Lab Results  Component Value Date   CHOL 136 08/29/2021   Lab Results  Component Value Date   HDL 48 08/29/2021   Lab Results  Component Value Date   LDLCALC 53 08/29/2021   Lab Results   Component Value Date   TRIG 218 (H) 08/29/2021   Lab Results  Component Value Date   CHOLHDL 2.8 08/29/2021  Lab Results  Component Value Date   HGBA1C 7.9 (H) 08/29/2021       Assessment & Plan:   Problem List Items Addressed This Visit   None Visit Diagnoses     Acute non-recurrent sinusitis of other sinus    -  Primary   Relevant Medications   amoxicillin-clavulanate (AUGMENTIN) 875-125 MG tablet   benzonatate (TESSALON) 200 MG capsule   Other Relevant Orders   POC COVID-19 BinaxNow (Completed)   Acute bronchitis due to other specified organisms       Relevant Orders   POC COVID-19 BinaxNow (Completed)      Meds ordered this encounter  Medications   amoxicillin-clavulanate (AUGMENTIN) 875-125 MG tablet    Sig: Take 1 tablet by mouth 2 (two) times daily.    Dispense:  20 tablet    Refill:  0   benzonatate (TESSALON) 200 MG capsule    Sig: Take 1 capsule (200 mg total) by mouth 3 (three) times daily as needed for cough.    Dispense:  30 capsule    Refill:  0    Orders Placed This Encounter  Procedures   POC COVID-19 BinaxNow     Follow-up: No follow-ups on file.  An After Visit Summary was printed and given to the patient.  Rochel Brome, MD [facility] [phone]

## 2021-09-17 ENCOUNTER — Other Ambulatory Visit: Payer: Self-pay | Admitting: Family Medicine

## 2021-09-17 DIAGNOSIS — E1142 Type 2 diabetes mellitus with diabetic polyneuropathy: Secondary | ICD-10-CM

## 2021-10-03 ENCOUNTER — Telehealth: Payer: Self-pay

## 2021-10-03 NOTE — Progress Notes (Unsigned)
Chronic Care Management Pharmacy Assistant   Name: Nathaniel Nicholson  MRN: [id number] DOB: [birth year]   Reason for Encounter: Disease State call for DM   Recent office visits:  09/12/21 Blane Ohara MD. Seen for cold symptoms. Started on Augmentin 875-125mg  and Benzonatate 200mg .   08/29/21 Cox, Kirsten MD. Seen for routine visit. No med changes.  Recent consult visits:  07/11/21 (Podiatry) 07/13/21 DM. Seen for toenail fungus. No med changes.  Hospital visits:  None  Medications: Outpatient Encounter Medications as of 10/03/2021  Medication Sig   allopurinol (ZYLOPRIM) 100 MG tablet Take 1 tablet by mouth once daily   amoxicillin-clavulanate (AUGMENTIN) 875-125 MG tablet Take 1 tablet by mouth 2 (two) times daily.   atorvastatin (LIPITOR) 20 MG tablet Take 1 tablet by mouth once daily   benzonatate (TESSALON) 200 MG capsule Take 1 capsule (200 mg total) by mouth 3 (three) times daily as needed for cough.   colchicine 0.6 MG tablet Take 1 tablet by mouth twice daily   dapagliflozin propanediol (FARXIGA) 5 MG TABS tablet Take 1 tablet (5 mg total) by mouth daily before breakfast.   esomeprazole (NEXIUM) 40 MG capsule Take 40 mg by mouth daily at 12 noon.   gabapentin (NEURONTIN) 300 MG capsule Take 1 capsule by mouth twice daily   losartan (COZAAR) 100 MG tablet Take 1 tablet (100 mg total) by mouth daily.   metFORMIN (GLUCOPHAGE) 1000 MG tablet TAKE 1 TABLET BY MOUTH TWICE DAILY WITH A MEAL   tadalafil (CIALIS) 10 MG tablet Take 1 tablet (10 mg total) by mouth daily.   testosterone cypionate (DEPOTESTOSTERONE CYPIONATE) 200 MG/ML injection Inject 1.25 mLs (250 mg total) into the muscle every 14 (fourteen) days.   timolol (TIMOPTIC) 0.25 % ophthalmic solution 1 drop 2 (two) times daily.   No facility-administered encounter medications on file as of 10/03/2021.    Recent Relevant Labs: Lab Results  Component Value Date/Time   HGBA1C 7.9 (H) 08/29/2021 08:10 AM    HGBA1C 8.0 (H) 05/29/2021 08:30 AM   MICROALBUR 0 02/21/2021 08:15 AM   MICROALBUR 80 01/19/2020 08:12 AM    Kidney Function Lab Results  Component Value Date/Time   CREATININE 1.05 08/29/2021 08:10 AM   CREATININE 1.01 05/29/2021 08:30 AM   GFRNONAA 66 05/17/2020 08:07 AM   GFRAA 77 05/17/2020 08:07 AM     Current antihyperglycemic regimen:  Farxiga  5mg  daily Metformin 1000mg  twice daily  Patient verbally confirms he is taking the above medications as directed. {yes/no:20286}  What recent interventions/DTPs have been made to improve glycemic control:  ***  Have there been any recent hospitalizations or ED visits since last visit with CPP? {yes/no:20286}  Patient {reports/denies:24182} hypoglycemic symptoms, including {Hypoglycemic Symptoms:3049003}  Patient {reports/denies:24182} hyperglycemic symptoms, including {symptoms; hyperglycemia:17903}  How often are you checking your blood sugar? {BG Testing frequency:23922}  What are your blood sugars ranging?  Fasting: *** Before meals: *** After meals: *** Bedtime: ***  On insulin? No  During the week, how often does your blood glucose drop below 70? {LowBGfrequency:24142}  Are you checking your feet daily/regularly? {yes/no:20286}  Adherence Review: Is the patient currently on a STATIN medication? Yes Is the patient currently on ACE/ARB medication? Yes Does the patient have >5 day gap between last estimated fill dates? CPP to review  Care Gaps: Last eye exam / Retinopathy Screening? 03/27/20 Last Annual Wellness Visit? 12/19/21 Last Diabetic Foot Exam? 08/29/21    Star Rating Drugs:  Medication:  Last Fill: Day  Supply Farxiga   09/26/21-09/04/21 30ds Metformin   08/07/21-05/13/21 90ds   Roxana Hires, CMA Clinical Pharmacist Assistant  [phone]

## 2021-11-02 ENCOUNTER — Other Ambulatory Visit: Payer: Self-pay | Admitting: Family Medicine

## 2021-11-02 DIAGNOSIS — M1A0610 Idiopathic chronic gout, right knee, without tophus (tophi): Secondary | ICD-10-CM

## 2021-11-17 ENCOUNTER — Other Ambulatory Visit: Payer: Self-pay | Admitting: Family Medicine

## 2021-11-17 DIAGNOSIS — E782 Mixed hyperlipidemia: Secondary | ICD-10-CM

## 2021-12-05 ENCOUNTER — Ambulatory Visit (INDEPENDENT_AMBULATORY_CARE_PROVIDER_SITE_OTHER): Payer: Medicare HMO | Admitting: Family Medicine

## 2021-12-05 VITALS — BP 124/64 | HR 80 | Temp 97.1°F | Resp 16 | Ht 74.0 in | Wt 206.0 lb

## 2021-12-05 DIAGNOSIS — K219 Gastro-esophageal reflux disease without esophagitis: Secondary | ICD-10-CM | POA: Diagnosis not present

## 2021-12-05 DIAGNOSIS — E1159 Type 2 diabetes mellitus with other circulatory complications: Secondary | ICD-10-CM

## 2021-12-05 DIAGNOSIS — E1142 Type 2 diabetes mellitus with diabetic polyneuropathy: Secondary | ICD-10-CM | POA: Diagnosis not present

## 2021-12-05 DIAGNOSIS — E782 Mixed hyperlipidemia: Secondary | ICD-10-CM | POA: Diagnosis not present

## 2021-12-05 DIAGNOSIS — E291 Testicular hypofunction: Secondary | ICD-10-CM

## 2021-12-05 DIAGNOSIS — R69 Illness, unspecified: Secondary | ICD-10-CM | POA: Diagnosis not present

## 2021-12-05 DIAGNOSIS — I152 Hypertension secondary to endocrine disorders: Secondary | ICD-10-CM | POA: Diagnosis not present

## 2021-12-05 DIAGNOSIS — F17218 Nicotine dependence, cigarettes, with other nicotine-induced disorders: Secondary | ICD-10-CM

## 2021-12-05 MED ORDER — TESTOSTERONE CYPIONATE 200 MG/ML IM SOLN: 250.0000 mg | INTRAMUSCULAR | 5 refills | Status: DC

## 2021-12-05 MED ORDER — COLCHICINE 0.6 MG PO TABS: 0.6000 mg | ORAL_TABLET | Freq: Two times a day (BID) | ORAL | 1 refills | Status: AC

## 2021-12-05 MED ORDER — ATORVASTATIN CALCIUM 20 MG PO TABS: 20.0000 mg | ORAL_TABLET | Freq: Every day | ORAL | 1 refills | Status: DC

## 2021-12-05 NOTE — Progress Notes (Signed)
Subjective:  Patient ID: Nathaniel Nicholson, male    DOB: 1951/02/10  Age: 71 y.o. MRN: [id number]  Chief Complaint  Patient presents with   Diabetes   Gastroesophageal Reflux    HPI Diabetes:  Complications: neuropathy Glucose checking: daily Glucose logs: 122-146. Hypoglycemia: no  Most recent A1C: 7.9 Current medications: metformin 1000 mg one twice daily, farxiga 5 mg daily, gabapentin 300 mg twice daily  Last Eye Exam: Jan 2023. Glaucoma: eye drops. Foot checks: daily   Hyperlipidemia: Current medications: lipitor 20 mg daily.    Hypertension: Current medications:  Losartan 100 mg daily    GERD: nexium 40 mg daily.   Hypogonadism: testosterone shots. Has not taken for 2 months.   Diet: healthy. Exercise: walk a lot at work.  Tobacco: 1/2 ppd. Alcohol: 4 beers a day.    Current Outpatient Medications on File Prior to Visit  Medication Sig Dispense Refill   allopurinol (ZYLOPRIM) 100 MG tablet Take 1 tablet by mouth once daily 90 tablet 0   dapagliflozin propanediol (FARXIGA) 5 MG TABS tablet Take 1 tablet (5 mg total) by mouth daily before breakfast. 90 tablet 1   esomeprazole (NEXIUM) 40 MG capsule Take 40 mg by mouth daily at 12 noon.     gabapentin (NEURONTIN) 300 MG capsule Take 1 capsule by mouth twice daily 180 capsule 1   losartan (COZAAR) 100 MG tablet Take 1 tablet (100 mg total) by mouth daily. 90 tablet 1   metFORMIN (GLUCOPHAGE) 1000 MG tablet TAKE 1 TABLET BY MOUTH TWICE DAILY WITH A MEAL 180 tablet 0   tadalafil (CIALIS) 10 MG tablet Take 1 tablet (10 mg total) by mouth daily. 30 tablet 3   timolol (TIMOPTIC) 0.25 % ophthalmic solution 1 drop 2 (two) times daily.     No current facility-administered medications on file prior to visit.   Past Medical History:  Diagnosis Date   Diabetes mellitus without complication (HCC)    GERD (gastroesophageal reflux disease)    Hyperlipemia    Hypertension    Nicotine dependence, cigarettes, uncomplicated     Other male erectile dysfunction    Peptic ulcer 2000   Testicular hypofunction    Vitamin D deficiency    Past Surgical History:  Procedure Laterality Date   CATARACT EXTRACTION Left 2017    Family History  Problem Relation Age of Onset   Cancer Father        lung   Cancer Sister        lung   Cancer Brother        throat   Hypertension Other    Diabetes Other    Social History   Socioeconomic History   Marital status: Married    Spouse name: Not on file   Number of children: Not on file   Years of education: Not on file   Highest education level: Not on file  Occupational History   Occupation: Marine scientist  Tobacco Use   Smoking status: Every Day    Packs/day: 1.00    Years: 35.00    Total pack years: 35.00    Types: Cigarettes   Smokeless tobacco: Never   Tobacco comments:    1 ppd for > 30 years.  Substance and Sexual Activity   Alcohol use: Yes    Alcohol/week: 28.0 standard drinks of alcohol    Types: 28 Cans of beer per week    Comment: 4/daily   Drug use: No   Sexual activity: Not on file  Other Topics Concern   Not on file  Social History Narrative   Not on file   Social Determinants of Health   Financial Resource Strain: High Risk (06/19/2021)   Overall Financial Resource Strain (CARDIA)    Difficulty of Paying Living Expenses: Very hard  Food Insecurity: Not on file  Transportation Needs: No Transportation Needs (06/19/2021)   PRAPARE - Administrator, Civil Service (Medical): No    Lack of Transportation (Non-Medical): No  Physical Activity: Not on file  Stress: Not on file  Social Connections: Not on file    Review of Systems  Constitutional:  Negative for chills and fever.  HENT:  Negative for congestion, rhinorrhea and sore throat.   Respiratory:  Negative for cough and shortness of breath.   Cardiovascular:  Negative for chest pain and palpitations.  Gastrointestinal:  Negative for abdominal pain, constipation, diarrhea,  nausea and vomiting.  Genitourinary:  Negative for dysuria and urgency.  Musculoskeletal:  Negative for arthralgias, back pain and myalgias.  Neurological:  Negative for dizziness and headaches.  Psychiatric/Behavioral:  Negative for dysphoric mood. The patient is not nervous/anxious.      Objective:  BP 124/64   Pulse 80   Temp (!) 97.1 F (36.2 C)   Resp 16   Ht 6\' 2"  (1.88 m)   Wt 206 lb (93.4 kg)   BMI 26.45 kg/m      12/05/2021    7:56 AM 09/12/2021   12:09 PM 08/29/2021    7:40 AM  BP/Weight  Systolic BP 124 120 124  Diastolic BP 64 90 84  Wt. (Lbs) 206 212 212  BMI 26.45 kg/m2 27.22 kg/m2 27.22 kg/m2    Physical Exam Vitals reviewed.  Constitutional:      Appearance: Normal appearance.  Neck:     Vascular: No carotid bruit.  Cardiovascular:     Rate and Rhythm: Normal rate and regular rhythm.     Pulses: Normal pulses.     Heart sounds: Normal heart sounds.  Pulmonary:     Effort: Pulmonary effort is normal.     Breath sounds: Normal breath sounds. No wheezing, rhonchi or rales.  Abdominal:     General: Bowel sounds are normal.     Palpations: Abdomen is soft.     Tenderness: There is no abdominal tenderness.  Neurological:     Mental Status: He is alert.  Psychiatric:        Mood and Affect: Mood normal.        Behavior: Behavior normal.     Diabetic Foot Exam - Simple   Simple Foot Form Diabetic Foot exam was performed with the following findings: Yes 12/05/2021  8:27 AM  Visual Inspection See comments: Yes Sensation Testing Intact to touch and monofilament testing bilaterally: Yes Pulse Check Posterior Tibialis and Dorsalis pulse intact bilaterally: Yes Comments Thickened skin. Calluses. Thick nails.       Lab Results  Component Value Date   WBC 3.1 (L) 08/29/2021   HGB 13.9 08/29/2021   HCT 40.7 08/29/2021   PLT 176 08/29/2021   GLUCOSE 191 (H) 08/29/2021   CHOL 136 08/29/2021   TRIG 218 (H) 08/29/2021   HDL 48 08/29/2021    LDLCALC 53 08/29/2021   ALT 35 08/29/2021   AST 23 08/29/2021   NA 137 08/29/2021   K 4.9 08/29/2021   CL 98 08/29/2021   CREATININE 1.05 08/29/2021   BUN 16 08/29/2021   CO2 25 08/29/2021   TSH 2.130  02/21/2021   HGBA1C 7.9 (H) 08/29/2021   MICROALBUR 0 02/21/2021      Assessment & Plan:   Problem List Items Addressed This Visit       Cardiovascular and Mediastinum   Hypertension associated with diabetes (HCC)    Well controlled.  No changes to medicines. Continue losartan 100 mg daily.  Continue to work on eating a healthy diet and exercise.  Labs drawn today.        Relevant Medications   atorvastatin (LIPITOR) 20 MG tablet   Other Relevant Orders   Comprehensive metabolic panel   CBC with Differential/Platelet     Digestive   Acid reflux    Continue nexium 40 mg daily.         Endocrine   Diabetic polyneuropathy associated with type 2 diabetes mellitus (HCC)    Control: Await labs/testing for assessment and recommendations. Recommend check sugars fasting daily. Recommend check feet daily. Recommend annual eye exams. Medicines: Continue metformin 1000 mg one twice daily, farxiga 5 mg daily, gabapentin 300 mg twice daily. Continue to work on eating a healthy diet and exercise.  Labs drawn today.         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        Relevant Medications   testosterone cypionate (DEPOTESTOSTERONE CYPIONATE) 200 MG/ML injection   Other Relevant Orders   Testosterone,Free and Total     Other   Hyperlipemia    Await labs/testing for assessment and recommendations. Continue lipitor 20 mg daily.  Continue to work on eating a healthy diet and exercise.  Labs drawn today.        Relevant Medications   atorvastatin (LIPITOR) 20 MG tablet   Other Relevant Orders    Lipid panel   Nicotine dependence  .  Meds ordered this encounter  Medications   colchicine 0.6 MG tablet    Sig: Take 1 tablet (0.6 mg total) by mouth 2 (two) times daily.    Dispense:  14 tablet    Refill:  1   testosterone cypionate (DEPOTESTOSTERONE CYPIONATE) 200 MG/ML injection    Sig: Inject 1.25 mLs (250 mg total) into the muscle every 14 (fourteen) days.    Dispense:  3 mL    Refill:  5   atorvastatin (LIPITOR) 20 MG tablet    Sig: Take 1 tablet (20 mg total) by mouth daily.    Dispense:  90 tablet    Refill:  1    Orders Placed This Encounter  Procedures   Microalbumin / creatinine urine ratio   Lipid panel   Hemoglobin A1c   Comprehensive metabolic panel   CBC with Differential/Platelet   Testosterone,Free and Total     Follow-up: Return in about 3 months (around 03/07/2022) for chronic fasting, awv due now I think..  An After Visit Summary was printed and given to the patient.  Blane Ohara, MD [facility] [phone]

## 2021-12-09 ENCOUNTER — Encounter: Payer: Self-pay | Admitting: Family Medicine

## 2021-12-09 NOTE — Assessment & Plan Note (Signed)
Control: Await labs/testing for assessment and recommendations. Recommend check sugars fasting daily. Recommend check feet daily. Recommend annual eye exams. Medicines: Continue metformin 1000 mg one twice daily, farxiga 5 mg daily, gabapentin 300 mg twice daily. Continue to work on eating a healthy diet and exercise.  Labs drawn today.

## 2021-12-09 NOTE — Assessment & Plan Note (Signed)
Well controlled.  No changes to medicines. Continue losartan 100 mg daily.  Continue to work on eating a healthy diet and exercise.  Labs drawn today.  

## 2021-12-09 NOTE — Assessment & Plan Note (Signed)
Continue nexium 40 mg daily.  

## 2021-12-09 NOTE — Assessment & Plan Note (Signed)
Await labs/testing for assessment and recommendations. Continue lipitor 20 mg daily.  Continue to work on eating a healthy diet and exercise.  Labs drawn today.

## 2021-12-09 NOTE — Assessment & Plan Note (Signed)
Check testosterone levels.  

## 2021-12-12 LAB — CBC WITH DIFFERENTIAL/PLATELET
Basophils Absolute: 0 10*3/uL (ref 0.0–0.2)
Basos: 0 %
EOS (ABSOLUTE): 0.2 10*3/uL (ref 0.0–0.4)
Eos: 4 %
Hematocrit: 45.5 % (ref 37.5–51.0)
Hemoglobin: 15.1 g/dL (ref 13.0–17.7)
Immature Grans (Abs): 0 10*3/uL (ref 0.0–0.1)
Immature Granulocytes: 0 %
Lymphocytes Absolute: 1.4 10*3/uL (ref 0.7–3.1)
Lymphs: 40 %
MCH: 33 pg (ref 26.6–33.0)
MCHC: 33.2 g/dL (ref 31.5–35.7)
MCV: 99 fL — ABNORMAL HIGH (ref 79–97)
Monocytes Absolute: 0.3 10*3/uL (ref 0.1–0.9)
Monocytes: 9 %
Neutrophils Absolute: 1.6 10*3/uL (ref 1.4–7.0)
Neutrophils: 47 %
Platelets: 177 10*3/uL (ref 150–450)
RBC: 4.58 x10E6/uL (ref 4.14–5.80)
RDW: 12.7 % (ref 11.6–15.4)
WBC: 3.4 10*3/uL (ref 3.4–10.8)

## 2021-12-12 LAB — TESTOSTERONE,FREE AND TOTAL
Testosterone, Free: 7.4 pg/mL (ref 6.6–18.1)
Testosterone: 222 ng/dL — ABNORMAL LOW (ref 264–916)

## 2021-12-12 LAB — MICROALBUMIN / CREATININE URINE RATIO
Creatinine, Urine: 34.6 mg/dL
Microalb/Creat Ratio: 23 mg/g creat (ref 0–29)
Microalbumin, Urine: 7.8 ug/mL

## 2021-12-12 LAB — LIPID PANEL
Chol/HDL Ratio: 2.1 ratio (ref 0.0–5.0)
Cholesterol, Total: 118 mg/dL (ref 100–199)
HDL: 57 mg/dL (ref >39)
LDL Chol Calc (NIH): 40 mg/dL (ref 0–99)
Triglycerides: 122 mg/dL (ref 0–149)
VLDL Cholesterol Cal: 21 mg/dL (ref 5–40)

## 2021-12-12 LAB — HEMOGLOBIN A1C
Est. average glucose Bld gHb Est-mCnc: 163 mg/dL
Hgb A1c MFr Bld: 7.3 % — ABNORMAL HIGH (ref 4.8–5.6)

## 2021-12-12 LAB — COMPREHENSIVE METABOLIC PANEL
ALT: 34 IU/L (ref 0–44)
AST: 25 IU/L (ref 0–40)
Albumin/Globulin Ratio: 2 (ref 1.2–2.2)
Albumin: 4.7 g/dL (ref 3.9–4.9)
Alkaline Phosphatase: 92 IU/L (ref 44–121)
BUN/Creatinine Ratio: 15 (ref 10–24)
BUN: 16 mg/dL (ref 8–27)
Bilirubin Total: 0.3 mg/dL (ref 0.0–1.2)
CO2: 23 mmol/L (ref 20–29)
Calcium: 9.9 mg/dL (ref 8.6–10.2)
Chloride: 101 mmol/L (ref 96–106)
Creatinine, Ser: 1.1 mg/dL (ref 0.76–1.27)
Globulin, Total: 2.3 g/dL (ref 1.5–4.5)
Glucose: 130 mg/dL — ABNORMAL HIGH (ref 70–99)
Potassium: 4.6 mmol/L (ref 3.5–5.2)
Sodium: 139 mmol/L (ref 134–144)
Total Protein: 7 g/dL (ref 6.0–8.5)
eGFR: 73 mL/min/{1.73_m2} (ref >59)

## 2021-12-12 LAB — CARDIOVASCULAR RISK ASSESSMENT

## 2021-12-12 NOTE — Progress Notes (Signed)
Blood count normal.  Liver function normal.  Kidney function normal.  Cholesterol: Great. Trigs 218 improved to 122.  HBA1C: improved from 7.9 down to 7.3. I would recommend combine metformin and farxiga , with an increased dose of farxiga 10 mg. Xigduo XR 08/998 mg 2 tablets in am.  Not spilling protein in urine. Resolved to normal. Testosterone level low. Recommend restart testosterone shots 200 mg once every 2 weeks.

## 2021-12-19 ENCOUNTER — Ambulatory Visit (INDEPENDENT_AMBULATORY_CARE_PROVIDER_SITE_OTHER): Payer: Medicare HMO

## 2021-12-19 DIAGNOSIS — Z0000 Encounter for general adult medical examination without abnormal findings: Secondary | ICD-10-CM

## 2021-12-19 DIAGNOSIS — F17218 Nicotine dependence, cigarettes, with other nicotine-induced disorders: Secondary | ICD-10-CM

## 2021-12-21 NOTE — Patient Instructions (Signed)
Nathaniel Nicholson , Thank you for taking time to come for your Medicare Wellness Visit. I appreciate your ongoing commitment to your health goals. Please review the following plan we discussed and let me know if I can assist you in the future.   Screening recommendations/referrals: Colonoscopy: Cologuard never returned - please check expiration date Recommended yearly ophthalmology/optometry visit for glaucoma screening and checkup Recommended yearly dental visit for hygiene and checkup  Vaccinations: Influenza vaccine: Due Pneumococcal vaccine: Due in November Tdap vaccine: Due - you can get this at the pharmacy Shingles vaccine: Due - you can get this at the pharmacy    Advanced directives: Please bring a copy for your medical record   Preventive Care 65 Years and Older, Male  Preventive care refers to lifestyle choices and visits with your health care provider that can promote health and wellness. What does preventive care include? A yearly physical exam. This is also called an annual well check. Dental exams once or twice a year. Routine eye exams. Ask your health care provider how often you should have your eyes checked. Personal lifestyle choices, including: Daily care of your teeth and gums. Regular physical activity. Eating a healthy diet. Avoiding tobacco and drug use. Limiting alcohol use. Practicing safe sex. Taking low doses of aspirin every day. Taking vitamin and mineral supplements as recommended by your health care provider. What happens during an annual well check? The services and screenings done by your health care provider during your annual well check will depend on your age, overall health, lifestyle risk factors, and family history of disease. Counseling  Your health care provider may ask you questions about your: Alcohol use. Tobacco use. Drug use. Emotional well-being. Home and relationship well-being. Sexual activity. Eating habits. History of  falls. Memory and ability to understand (cognition). Work and work Astronomer. Screening  You may have the following tests or measurements: Height, weight, and BMI. Blood pressure. Lipid and cholesterol levels. These may be checked every 5 years, or more frequently if you are over 25 years old. Skin check. Lung cancer screening. You may have this screening every year starting at age 62 if you have a 30-pack-year history of smoking and currently smoke or have quit within the past 15 years. Fecal occult blood test (FOBT) of the stool. You may have this test every year starting at age 73. Flexible sigmoidoscopy or colonoscopy. You may have a sigmoidoscopy every 5 years or a colonoscopy every 10 years starting at age 74. Prostate cancer screening. Recommendations will vary depending on your family history and other risks. Hepatitis C blood test. Hepatitis B blood test. Sexually transmitted disease (STD) testing. Diabetes screening. This is done by checking your blood sugar (glucose) after you have not eaten for a while (fasting). You may have this done every 1-3 years. Abdominal aortic aneurysm (AAA) screening. You may need this if you are a current or former smoker. Osteoporosis. You may be screened starting at age 9 if you are at high risk. Talk with your health care provider about your test results, treatment options, and if necessary, the need for more tests. Vaccines  Your health care provider may recommend certain vaccines, such as: Influenza vaccine. This is recommended every year. Tetanus, diphtheria, and acellular pertussis (Tdap, Td) vaccine. You may need a Td booster every 10 years. Zoster vaccine. You may need this after age 94. Pneumococcal 13-valent conjugate (PCV13) vaccine. One dose is recommended after age 48. Pneumococcal polysaccharide (PPSV23) vaccine. One dose is recommended after  age 34. Talk to your health care provider about which screenings and vaccines you need and  how often you need them. This information is not intended to replace advice given to you by your health care provider. Make sure you discuss any questions you have with your health care provider. Document Released: 04/28/2015 Document Revised: 12/20/2015 Document Reviewed: 01/31/2015 Elsevier Interactive Patient Education  2017 [place] Prevention in the Home Falls can cause injuries. They can happen to people of all ages. There are many things you can do to make your home safe and to help prevent falls. What can I do on the outside of my home? Regularly fix the edges of walkways and driveways and fix any cracks. Remove anything that might make you trip as you walk through a door, such as a raised step or threshold. Trim any bushes or trees on the path to your home. Use bright outdoor lighting. Clear any walking paths of anything that might make someone trip, such as rocks or tools. Regularly check to see if handrails are loose or broken. Make sure that both sides of any steps have handrails. Any raised decks and porches should have guardrails on the edges. Have any leaves, snow, or ice cleared regularly. Use sand or salt on walking paths during winter. Clean up any spills in your garage right away. This includes oil or grease spills. What can I do in the bathroom? Use night lights. Install grab bars by the toilet and in the tub and shower. Do not use towel bars as grab bars. Use non-skid mats or decals in the tub or shower. If you need to sit down in the shower, use a plastic, non-slip stool. Keep the floor dry. Clean up any water that spills on the floor as soon as it happens. Remove soap buildup in the tub or shower regularly. Attach bath mats securely with double-sided non-slip rug tape. Do not have throw rugs and other things on the floor that can make you trip. What can I do in the bedroom? Use night lights. Make sure that you have a light by your bed that is easy to  reach. Do not use any sheets or blankets that are too big for your bed. They should not hang down onto the floor. Have a firm chair that has side arms. You can use this for support while you get dressed. Do not have throw rugs and other things on the floor that can make you trip. What can I do in the kitchen? Clean up any spills right away. Avoid walking on wet floors. Keep items that you use a lot in easy-to-reach places. If you need to reach something above you, use a strong step stool that has a grab bar. Keep electrical cords out of the way. Do not use floor polish or wax that makes floors slippery. If you must use wax, use non-skid floor wax. Do not have throw rugs and other things on the floor that can make you trip. What can I do with my stairs? Do not leave any items on the stairs. Make sure that there are handrails on both sides of the stairs and use them. Fix handrails that are broken or loose. Make sure that handrails are as long as the stairways. Check any carpeting to make sure that it is firmly attached to the stairs. Fix any carpet that is loose or worn. Avoid having throw rugs at the top or bottom of the stairs. If you do  have throw rugs, attach them to the floor with carpet tape. Make sure that you have a light switch at the top of the stairs and the bottom of the stairs. If you do not have them, ask someone to add them for you. What else can I do to help prevent falls? Wear shoes that: Do not have high heels. Have rubber bottoms. Are comfortable and fit you well. Are closed at the toe. Do not wear sandals. If you use a stepladder: Make sure that it is fully opened. Do not climb a closed stepladder. Make sure that both sides of the stepladder are locked into place. Ask someone to hold it for you, if possible. Clearly mark and make sure that you can see: Any grab bars or handrails. First and last steps. Where the edge of each step is. Use tools that help you move  around (mobility aids) if they are needed. These include: Canes. Walkers. Scooters. Crutches. Turn on the lights when you go into a dark area. Replace any light bulbs as soon as they burn out. Set up your furniture so you have a clear path. Avoid moving your furniture around. If any of your floors are uneven, fix them. If there are any pets around you, be aware of where they are. Review your medicines with your doctor. Some medicines can make you feel dizzy. This can increase your chance of falling. Ask your doctor what other things that you can do to help prevent falls. This information is not intended to replace advice given to you by your health care provider. Make sure you discuss any questions you have with your health care provider. Document Released: 01/26/2009 Document Revised: 09/07/2015 Document Reviewed: 05/06/2014 Elsevier Interactive Patient Education  2017 [employer].

## 2021-12-21 NOTE — Progress Notes (Signed)
Subjective:   Everado Pillsbury is a 71 y.o. male who presents for Medicare Annual/Subsequent preventive examination.  I connected with  Guss Bunde on 12/21/21 by a audio enabled telemedicine application and verified that I am speaking with the correct person using two identifiers.  Patient Location: Other:  Systems analyst Location: Office/Clinic  I discussed the limitations of evaluation and management by telemedicine. The patient expressed understanding and agreed to proceed.   Cardiac Risk Factors include: smoking/ tobacco exposure;male gender;hypertension;diabetes mellitus;dyslipidemia;advanced age (>57men, >28 women)     Objective:    There were no vitals filed for this visit. Recent height and weight on file used to calculate BMI.     09/09/2019    8:22 AM 01/12/2018    3:11 PM 01/07/2017    6:19 PM 08/06/2016    5:57 PM  Advanced Directives  Does Patient Have a Medical Advance Directive? No No No No  Would patient like information on creating a medical advance directive? Yes (MAU/Ambulatory/Procedural Areas - Information given)       Current Medications (verified) Outpatient Encounter Medications as of 12/19/2021  Medication Sig   allopurinol (ZYLOPRIM) 100 MG tablet Take 1 tablet by mouth once daily   atorvastatin (LIPITOR) 20 MG tablet Take 1 tablet (20 mg total) by mouth daily.   colchicine 0.6 MG tablet Take 1 tablet (0.6 mg total) by mouth 2 (two) times daily.   dapagliflozin propanediol (FARXIGA) 5 MG TABS tablet Take 1 tablet (5 mg total) by mouth daily before breakfast.   esomeprazole (NEXIUM) 40 MG capsule Take 40 mg by mouth daily at 12 noon.   gabapentin (NEURONTIN) 300 MG capsule Take 1 capsule by mouth twice daily   losartan (COZAAR) 100 MG tablet Take 1 tablet (100 mg total) by mouth daily.   metFORMIN (GLUCOPHAGE) 1000 MG tablet TAKE 1 TABLET BY MOUTH TWICE DAILY WITH A MEAL   tadalafil (CIALIS) 10 MG tablet Take 1 tablet (10 mg total) by mouth daily.    testosterone cypionate (DEPOTESTOSTERONE CYPIONATE) 200 MG/ML injection Inject 1.25 mLs (250 mg total) into the muscle every 14 (fourteen) days.   timolol (TIMOPTIC) 0.25 % ophthalmic solution 1 drop 2 (two) times daily.   No facility-administered encounter medications on file as of 12/19/2021.    Allergies (verified) Patient has no known allergies.   History: Past Medical History:  Diagnosis Date   Diabetes mellitus without complication (HCC)    GERD (gastroesophageal reflux disease)    Hyperlipemia    Hypertension    Nicotine dependence, cigarettes, uncomplicated    Other male erectile dysfunction    Peptic ulcer 2000   Testicular hypofunction    Vitamin D deficiency    Past Surgical History:  Procedure Laterality Date   CATARACT EXTRACTION Left 2017   Family History  Problem Relation Age of Onset   Cancer Father        lung   Cancer Sister        lung   Cancer Brother        throat   Hypertension Other    Diabetes Other    Social History   Socioeconomic History   Marital status: Married    Spouse name: Not on file   Number of children: Not on file   Years of education: Not on file   Highest education level: Not on file  Occupational History   Occupation: Marine scientist  Tobacco Use   Smoking status: Every Day    Packs/day: 1.00  Years: 35.00    Total pack years: 35.00    Types: Cigarettes   Smokeless tobacco: Never   Tobacco comments:    1 ppd for > 30 years.  Substance and Sexual Activity   Alcohol use: Yes    Alcohol/week: 28.0 standard drinks of alcohol    Types: 28 Cans of beer per week    Comment: 4/daily   Drug use: No   Sexual activity: Not on file  Other Topics Concern   Not on file  Social History Narrative   Not on file   Social Determinants of Health   Financial Resource Strain: High Risk (06/19/2021)   Overall Financial Resource Strain (CARDIA)    Difficulty of Paying Living Expenses: Very hard  Food Insecurity: No Food  Insecurity (12/21/2021)   Hunger Vital Sign    Worried About Running Out of Food in the Last Year: Never true    Ran Out of Food in the Last Year: Never true  Transportation Needs: No Transportation Needs (12/21/2021)   PRAPARE - Administrator, Civil Service (Medical): No    Lack of Transportation (Non-Medical): No  Physical Activity: Not on file  Stress: Not on file  Social Connections: Not on file    Tobacco Counseling Ready to quit: No Tobacco comments: 1 ppd for > 30 years.   Clinical Intake:  Pre-visit preparation completed: Yes Pain : No/denies pain   BMI - recorded: 26.45 Nutritional Status: BMI 25 -29 Overweight Nutritional Risks: None Diabetes: Yes (Last A1C 7.3) CBG done?: No Did pt. bring in CBG monitor from home?: No How often do you need to have someone help you when you read instructions, pamphlets, or other written materials from your doctor or pharmacy?: 1 - Never Interpreter Needed?: No    Activities of Daily Living    12/21/2021    8:02 AM  In your present state of health, do you have any difficulty performing the following activities:  Hearing? 0  Vision? 0  Difficulty concentrating or making decisions? 0  Walking or climbing stairs? 0  Dressing or bathing? 0  Doing errands, shopping? 0  Preparing Food and eating ? N  Using the Toilet? N  In the past six months, have you accidently leaked urine? N  Do you have problems with loss of bowel control? N  Managing your Medications? N  Managing your Finances? N  Housekeeping or managing your Housekeeping? N    Patient Care Team: Blane Ohara, MD as PCP - General (Family Medicine) Zettie Pho, [facility] as Pharmacist (Pharmacist)     Assessment:   This is a routine wellness examination for Warden.  Hearing/Vision screen No results found.  Dietary issues and exercise activities discussed: Current Exercise Habits: The patient does not participate in regular exercise at present, Exercise  limited by: None identified  Depression Screen    12/21/2021    8:02 AM 05/29/2021    8:18 AM 12/16/2020    8:52 AM 04/04/2020    8:27 AM 09/09/2019    8:23 AM 09/03/2019    7:45 AM  PHQ 2/9 Scores  PHQ - 2 Score 0 0 0 0 0 0  PHQ- 9 Score   0       Fall Risk    12/21/2021    8:02 AM 12/16/2020    8:53 AM 04/04/2020    8:27 AM 09/09/2019    8:22 AM 09/03/2019    7:45 AM  Fall Risk   Falls in the  past year? 0 0 0 0 0  Number falls in past yr: 0 0 0 0 0  Injury with Fall? 0 0 0 0   Risk for fall due to : No Fall Risks No Fall Risks No Fall Risks No Fall Risks   Follow up Falls evaluation completed;Falls prevention discussed Falls evaluation completed Falls evaluation completed;Falls prevention discussed Falls evaluation completed;Falls prevention discussed     FALL RISK PREVENTION PERTAINING TO THE HOME:  Any stairs in or around the home? No  If so, are there any without handrails? No  Home free of loose throw rugs in walkways, pet beds, electrical cords, etc? Yes  Adequate lighting in your home to reduce risk of falls? Yes   ASSISTIVE DEVICES UTILIZED TO PREVENT FALLS:  Life alert? No  Use of a cane, walker or w/c? No  Grab bars in the bathroom? No  Shower chair or bench in shower? No  Elevated toilet seat or a handicapped toilet? No    Cognitive Function:        12/21/2021    8:03 AM 12/16/2020    8:53 AM 09/09/2019    8:26 AM  6CIT Screen  What Year? 0 points 0 points 0 points  What month? 0 points 0 points 0 points  What time? 0 points 0 points 0 points  Count back from 20 0 points 0 points 0 points  Months in reverse 0 points 0 points 0 points  Repeat phrase 0 points 2 points 0 points  Total Score 0 points 2 points 0 points    Immunizations Immunization History  Administered Date(s) Administered   Fluad Quad(high Dose 65+) 01/19/2020, 02/21/2021   PFIZER(Purple Top)SARS-COV-2 Vaccination 06/26/2019, 07/17/2019, 01/27/2020   Pfizer Covid-19 Vaccine Bivalent  Booster 39yrs & up 05/29/2021   Pneumococcal Conjugate-13 03/19/2018   Pneumococcal Polysaccharide-23 02/21/2017    TDAP status: Due, Education has been provided regarding the importance of this vaccine. Advised may receive this vaccine at local pharmacy or Health Dept. Aware to provide a copy of the vaccination record if obtained from local pharmacy or Health Dept. Verbalized acceptance and understanding.  Flu Vaccine status: Due, Education has been provided regarding the importance of this vaccine. Advised may receive this vaccine at local pharmacy or Health Dept. Aware to provide a copy of the vaccination record if obtained from local pharmacy or Health Dept. Verbalized acceptance and understanding.  Pneumococcal vaccine status: Up to date  Covid-19 vaccine status: Completed vaccines  Qualifies for Shingles Vaccine? Yes   Zostavax completed No   Shingrix Completed?: Yes  Screening Tests Health Maintenance  Topic Date Due   TETANUS/TDAP  Never done   Fecal DNA (Cologuard)  Never done   Zoster Vaccines- Shingrix (1 of 2) Never done   OPHTHALMOLOGY EXAM  03/27/2021   COVID-19 Vaccine (5 - Pfizer series) 09/26/2021   INFLUENZA VACCINE  11/13/2021   Pneumonia Vaccine 38+ Years old (3 - PPSV23 or PCV20) 02/21/2022   HEMOGLOBIN A1C  06/07/2022   FOOT EXAM  12/06/2022   Hepatitis C Screening  Completed   HPV VACCINES  Aged Out    Health Maintenance  Health Maintenance Due  Topic Date Due   TETANUS/TDAP  Never done   Fecal DNA (Cologuard)  Never done   Zoster Vaccines- Shingrix (1 of 2) Never done   OPHTHALMOLOGY EXAM  03/27/2021   COVID-19 Vaccine (5 - Pfizer series) 09/26/2021   INFLUENZA VACCINE  11/13/2021   Pneumonia Vaccine 48+ Years old (3 -  PPSV23 or PCV20) 02/21/2022    Colorectal cancer screening: Cologuard ordered in the past, patient never collected sample  Lung Cancer Screening: (Low Dose CT Chest recommended if Age 44-80 years, 30 pack-year currently smoking OR  have quit w/in 15years.) does qualify.   Lung Cancer Screening Referral: previously ordered  Additional Screening:  Hepatitis C Screening: does qualify; Completed 08/29/21  Vision Screening: Recommended annual ophthalmology exams for early detection of glaucoma and other disorders of the eye. Is the patient up to date with their annual eye exam?  Yes  Who is the provider or what is the name of the office in which the patient attends annual eye exams? [facility] -- records requested  Dental Screening: Recommended annual dental exams for proper oral hygiene  Community Resource Referral / Chronic Care Management: CRR required this visit?  No   CCM required this visit?  No      Plan:    1- Complete Cologuard 2- Get Tetanus and Shingrix Vaccines at the pharmacy 3- Get your flu shot at the office   I have personally reviewed and noted the following in the patient's chart:   Medical and social history Use of alcohol, tobacco or illicit drugs  Current medications and supplements including opioid prescriptions. Patient is not currently taking opioid prescriptions. Functional ability and status Nutritional status Physical activity Advanced directives List of other physicians Hospitalizations, surgeries, and ER visits in previous 12 months Vitals Screenings to include cognitive, depression, and falls Referrals and appointments  In addition, I have reviewed and discussed with patient certain preventive protocols, quality metrics, and best practice recommendations. A written personalized care plan for preventive services as well as general preventive health recommendations were provided to patient.     Jacklynn Bue, LPN   04/18/4816

## 2022-01-02 ENCOUNTER — Other Ambulatory Visit: Payer: Self-pay

## 2022-01-02 ENCOUNTER — Other Ambulatory Visit: Payer: Self-pay | Admitting: Family Medicine

## 2022-01-02 DIAGNOSIS — E291 Testicular hypofunction: Secondary | ICD-10-CM

## 2022-01-02 MED ORDER — XIGDUO XR 5-1000 MG PO TB24: 2.0000 | ORAL_TABLET | Freq: Every day | ORAL | 2 refills | Status: DC

## 2022-01-02 MED ORDER — TESTOSTERONE CYPIONATE 200 MG/ML IM SOLN: 200.0000 mg | INTRAMUSCULAR | 5 refills | Status: DC

## 2022-01-11 ENCOUNTER — Telehealth: Payer: Self-pay

## 2022-01-11 NOTE — Progress Notes (Signed)
Care Gap(s) Not Met that Need to be Addressed:   Colorectal Cancer Screening   Action Taken: Messaged PCP to address    Follow Up:

## 2022-01-16 ENCOUNTER — Telehealth: Payer: Self-pay

## 2022-01-16 NOTE — Progress Notes (Signed)
Chronic Care Management Pharmacy Assistant   Name: Sergi Gellner  MRN: [id number] DOB: 1950-06-15   Reason for Encounter: Disease State call for DM    Recent office visits:  01/02/22 Orders Only. Ordered  Xigduo XR 5-1000mg  daily.   12/19/21 Caro Hight LPN. Seen for Medicare Annual Wellness. No med changes.   12/05/21 Blane Ohara MD. Seen for routine visit.. I would recommend combine metformin and farxiga , with an increased dose of farxiga 10 mg. Xigduo XR 08/998 mg 2 tablets in am.   Recent consult visits:  None  Hospital visits:  None  Medications: Outpatient Encounter Medications as of 01/16/2022  Medication Sig   allopurinol (ZYLOPRIM) 100 MG tablet Take 1 tablet by mouth once daily   atorvastatin (LIPITOR) 20 MG tablet Take 1 tablet (20 mg total) by mouth daily.   colchicine 0.6 MG tablet Take 1 tablet (0.6 mg total) by mouth 2 (two) times daily.   dapagliflozin propanediol (FARXIGA) 5 MG TABS tablet Take 1 tablet (5 mg total) by mouth daily before breakfast.   Dapagliflozin-metFORMIN HCl ER (XIGDUO XR) 08-998 MG TB24 Take 2 tablets by mouth daily.   esomeprazole (NEXIUM) 40 MG capsule Take 40 mg by mouth daily at 12 noon.   gabapentin (NEURONTIN) 300 MG capsule Take 1 capsule by mouth twice daily   losartan (COZAAR) 100 MG tablet Take 1 tablet (100 mg total) by mouth daily.   metFORMIN (GLUCOPHAGE) 1000 MG tablet TAKE 1 TABLET BY MOUTH TWICE DAILY WITH A MEAL   tadalafil (CIALIS) 10 MG tablet Take 1 tablet (10 mg total) by mouth daily.   testosterone cypionate (DEPOTESTOSTERONE CYPIONATE) 200 MG/ML injection Inject 1 mL (200 mg total) into the muscle every 14 (fourteen) days.   timolol (TIMOPTIC) 0.25 % ophthalmic solution 1 drop 2 (two) times daily.   No facility-administered encounter medications on file as of 01/16/2022.    Recent Relevant Labs: Lab Results  Component Value Date/Time   HGBA1C 7.3 (H) 12/05/2021 08:28 AM   HGBA1C 7.9 (H) 08/29/2021 08:10 AM    MICROALBUR 0 02/21/2021 08:15 AM   MICROALBUR 80 01/19/2020 08:12 AM    Kidney Function Lab Results  Component Value Date/Time   CREATININE 1.10 12/05/2021 08:28 AM   CREATININE 1.05 08/29/2021 08:10 AM   GFRNONAA 66 05/17/2020 08:07 AM   GFRAA 77 05/17/2020 08:07 AM     Current antihyperglycemic regimen:  Xigduo XR 5-1000mg  2 tablets daily  Patient verbally confirms he is taking the above medications as directed. Yes  What recent interventions/DTPs have been made to improve glycemic control:  Combined Farxiga and Metformin into Xigduo XR 5-1000mg ,   Have there been any recent hospitalizations or ED visits since last visit with CPP? No  Patient reports hypoglycemic symptoms, including  weak  Patient reports hyperglycemic symptoms, including weakness  How often are you checking your blood sugar? in the morning before eating or drinking  What are your blood sugars ranging?  Fasting: 97, 117, 142, 128  On insulin? No  During the week, how often does your blood glucose drop below 70? Never  Are you checking your feet daily/regularly? Yes  Adherence Review: Is the patient currently on a STATIN medication? Yes Is the patient currently on ACE/ARB medication? Yes Does the patient have >5 day gap between last estimated fill dates? CPP to review  Care Gaps: Last eye exam / Retinopathy Screening? 03/27/20 Last Annual Wellness Visit? 12/19/21 Last Diabetic Foot Exam? 08/29/21    Star Rating  Drugs:  Medication:  Last Fill: Day Supply Xigduo   01/04/22  30ds   Elray Mcgregor, [place] Pharmacist Assistant  [phone]

## 2022-02-11 DIAGNOSIS — E1142 Type 2 diabetes mellitus with diabetic polyneuropathy: Secondary | ICD-10-CM | POA: Diagnosis not present

## 2022-02-11 DIAGNOSIS — B351 Tinea unguium: Secondary | ICD-10-CM | POA: Diagnosis not present

## 2022-02-11 DIAGNOSIS — Z7984 Long term (current) use of oral hypoglycemic drugs: Secondary | ICD-10-CM | POA: Diagnosis not present

## 2022-02-11 DIAGNOSIS — I878 Other specified disorders of veins: Secondary | ICD-10-CM | POA: Diagnosis not present

## 2022-02-18 DIAGNOSIS — H40013 Open angle with borderline findings, low risk, bilateral: Secondary | ICD-10-CM | POA: Diagnosis not present

## 2022-03-03 ENCOUNTER — Other Ambulatory Visit: Payer: Self-pay | Admitting: Family Medicine

## 2022-03-03 DIAGNOSIS — E1142 Type 2 diabetes mellitus with diabetic polyneuropathy: Secondary | ICD-10-CM

## 2022-03-03 DIAGNOSIS — E1159 Type 2 diabetes mellitus with other circulatory complications: Secondary | ICD-10-CM

## 2022-03-14 ENCOUNTER — Encounter: Payer: Self-pay | Admitting: Family Medicine

## 2022-03-14 NOTE — Assessment & Plan Note (Addendum)
The current medical regimen is effective;  continue present plan and medications. Continue testosterone shots every 2 weeks.  

## 2022-03-14 NOTE — Progress Notes (Unsigned)
Subjective:  Patient ID: Nathaniel BundeDennis Nicholson, male    DOB: 1950/05/09  Age: 71 y.o. MRN: [id number]  Chief Complaint  Patient presents with   Diabetes   Hypertension   HPI Diabetes:  Complications: neuropathy Glucose checking: daily Glucose logs: 120-140 Hypoglycemia: no  Most recent A1C: 7.3 Current medications: metformin 1000 mg one twice daily, gabapentin 300 mg twice daily  Last Eye Exam: Jan 2023. Glaucoma: eye drops. Foot checks: daily   Hyperlipidemia: Current medications: lipitor 20 mg daily.    Hypertension: Current medications:  Losartan 100 mg daily    GERD: nexium 40 mg daily. Pepcid does not work.  Hypogonadism: testosterone shots every 2 weeks.    Diet: healthy. Exercise: walk a lot at work.    Current Outpatient Medications on File Prior to Visit  Medication Sig Dispense Refill   colchicine 0.6 MG tablet Take 1 tablet (0.6 mg total) by mouth 2 (two) times daily. 14 tablet 1   esomeprazole (NEXIUM) 40 MG capsule Take 40 mg by mouth daily at 12 noon.     tadalafil (CIALIS) 10 MG tablet Take 1 tablet (10 mg total) by mouth daily. 30 tablet 3   testosterone cypionate (DEPOTESTOSTERONE CYPIONATE) 200 MG/ML injection Inject 1 mL (200 mg total) into the muscle every 14 (fourteen) days. 2 mL 5   timolol (TIMOPTIC) 0.25 % ophthalmic solution 1 drop 2 (two) times daily.     No current facility-administered medications on file prior to visit.   Past Medical History:  Diagnosis Date   Diabetes mellitus without complication (HCC)    GERD (gastroesophageal reflux disease)    Hyperlipemia    Hypertension    Nicotine dependence, cigarettes, uncomplicated    Onychomycosis of great toe 02/21/2021   Other male erectile dysfunction    Peptic ulcer 2000   Testicular hypofunction    Vitamin D deficiency    Past Surgical History:  Procedure Laterality Date   CATARACT EXTRACTION Left 2017    Family History  Problem Relation Age of Onset   Cancer Father        lung    Cancer Sister        lung   Cancer Brother        throat   Hypertension Other    Diabetes Other    Social History   Socioeconomic History   Marital status: Married    Spouse name: Not on file   Number of children: Not on file   Years of education: Not on file   Highest education level: Not on file  Occupational History   Occupation: Marine scientistfuneral director  Tobacco Use   Smoking status: Every Day    Packs/day: 0.50    Years: 50.00    Total pack years: 25.00    Types: Cigarettes   Smokeless tobacco: Never   Tobacco comments:    1 ppd for > 30 years.  Substance and Sexual Activity   Alcohol use: Yes    Alcohol/week: 28.0 standard drinks of alcohol    Types: 28 Cans of beer per week    Comment: 4/daily   Drug use: No   Sexual activity: Not on file  Other Topics Concern   Not on file  Social History Narrative   Not on file   Social Determinants of Health   Financial Resource Strain: High Risk (06/19/2021)   Overall Financial Resource Strain (CARDIA)    Difficulty of Paying Living Expenses: Very hard  Food Insecurity: No Food Insecurity (12/21/2021)  Hunger Vital Sign    Worried About Running Out of Food in the Last Year: Never true    Ran Out of Food in the Last Year: Never true  Transportation Needs: No Transportation Needs (12/21/2021)   PRAPARE - Administrator, Civil Service (Medical): No    Lack of Transportation (Non-Medical): No  Physical Activity: Not on file  Stress: Not on file  Social Connections: Not on file    Review of Systems  Constitutional:  Negative for chills, fatigue, fever and unexpected weight change.  HENT:  Negative for congestion, ear pain, sinus pain and sore throat.   Respiratory:  Negative for cough and shortness of breath.   Cardiovascular:  Negative for chest pain and palpitations.  Gastrointestinal:  Negative for abdominal pain, constipation, diarrhea, nausea and vomiting.  Endocrine: Negative for polydipsia, polyphagia and  polyuria.  Genitourinary:  Negative for dysuria.  Musculoskeletal:  Negative for back pain.  Skin:  Negative for rash.  Neurological:  Negative for headaches.     Objective:  BP 134/82   Pulse 80   Temp (!) 97.3 F (36.3 C)   Resp 16   Ht 6\' 2"  (1.88 m)   Wt 212 lb 6.4 oz (96.3 kg)   BMI 27.27 kg/m      03/15/2022    7:57 AM 12/05/2021    7:56 AM 09/12/2021   12:09 PM  BP/Weight  Systolic BP 134 124 120  Diastolic BP 82 64 90  Wt. (Lbs) 212.4 206 212  BMI 27.27 kg/m2 26.45 kg/m2 27.22 kg/m2    Physical Exam Vitals reviewed.  Constitutional:      Appearance: Normal appearance.  Neck:     Vascular: No carotid bruit.  Cardiovascular:     Rate and Rhythm: Normal rate and regular rhythm.     Pulses: Normal pulses.     Heart sounds: Normal heart sounds.  Pulmonary:     Effort: Pulmonary effort is normal.     Breath sounds: Normal breath sounds. No wheezing, rhonchi or rales.  Abdominal:     General: Bowel sounds are normal.     Palpations: Abdomen is soft.     Tenderness: There is no abdominal tenderness.  Neurological:     Mental Status: He is alert.  Psychiatric:        Mood and Affect: Mood normal.        Behavior: Behavior normal.     Diabetic Foot Exam - Simple   Simple Foot Form  03/15/2022 11:05 PM  Visual Inspection See comments: Yes Sensation Testing See comments: Yes Pulse Check Posterior Tibialis and Dorsalis pulse intact bilaterally: Yes Comments Decreased sensation BL. Calluses.      Lab Results  Component Value Date   WBC 3.4 03/15/2022   HGB 13.6 03/15/2022   HCT 39.9 03/15/2022   PLT 207 03/15/2022   GLUCOSE 163 (H) 03/15/2022   CHOL 142 03/15/2022   TRIG 128 03/15/2022   HDL 54 03/15/2022   LDLCALC 66 03/15/2022   ALT 30 03/15/2022   AST 22 03/15/2022   NA 138 03/15/2022   K 5.5 (H) 03/15/2022   CL 99 03/15/2022   CREATININE 1.05 03/15/2022   BUN 16 03/15/2022   CO2 25 03/15/2022   TSH 2.130 02/21/2021   HGBA1C 7.9 (H)  03/15/2022   MICROALBUR 0 02/21/2021      Assessment & Plan:   Problem List Items Addressed This Visit       Cardiovascular and Mediastinum  Hypertension associated with diabetes (HCC)    Well controlled.  No changes to medicines. Continue losartan 100 mg daily.  Continue to work on eating a healthy diet and exercise.  Labs drawn today.        Relevant Medications   atorvastatin (LIPITOR) 20 MG tablet   losartan (COZAAR) 100 MG tablet   metFORMIN (GLUCOPHAGE) 1000 MG tablet   dapagliflozin propanediol (FARXIGA) 10 MG TABS tablet   Other Relevant Orders   Comprehensive metabolic panel (Completed)   CBC with Differential/Platelet (Completed)   Cardiovascular Risk Assessment (Completed)     Digestive   Acid reflux    Continue nexium 40 mg daily.         Endocrine   Diabetic polyneuropathy associated with type 2 diabetes mellitus (HCC)    Control: not at goal Recommend check sugars fasting daily. Recommend check feet daily. Recommend annual eye exams. Medicines: Continue metformin 1000 mg one twice daily, Increase farxiga 10 mg daily, gabapentin 300 mg twice daily. Add actos 15 mg daily. Continue to work on eating a healthy diet and exercise.  Labs drawn today.         Relevant Medications   atorvastatin (LIPITOR) 20 MG tablet   gabapentin (NEURONTIN) 300 MG capsule   losartan (COZAAR) 100 MG tablet   metFORMIN (GLUCOPHAGE) 1000 MG tablet   dapagliflozin propanediol (FARXIGA) 10 MG TABS tablet   Other Relevant Orders   Hemoglobin A1c (Completed)   Hypogonadism in male    The current medical regimen is effective;  continue present plan and medications. Continue testosterone shots every 2 weeks.         Other   Hyperlipemia    Well controlled.  No changes to medicines. Continue lipitor 20 mg daily.  Continue to work on eating a healthy diet and exercise.  Labs drawn today.        Relevant Medications   atorvastatin (LIPITOR) 20 MG tablet   losartan  (COZAAR) 100 MG tablet   Other Relevant Orders   Lipid panel (Completed)   Myopia of both eyes   Nicotine dependence    Recommend cessation. Patient not ready at this time.       Other Visit Diagnoses     Need for influenza vaccination    -  Primary   Relevant Orders   Flu Vaccine QUAD High Dose(Fluad) (Completed)   Idiopathic chronic gout of right knee without tophus       Relevant Medications   allopurinol (ZYLOPRIM) 100 MG tablet   Screening for colon cancer       Relevant Orders   Cologuard     .  Meds ordered this encounter  Medications   allopurinol (ZYLOPRIM) 100 MG tablet    Sig: Take 1 tablet (100 mg total) by mouth daily.    Dispense:  90 tablet    Refill:  0   atorvastatin (LIPITOR) 20 MG tablet    Sig: Take 1 tablet (20 mg total) by mouth daily.    Dispense:  90 tablet    Refill:  1   gabapentin (NEURONTIN) 300 MG capsule    Sig: Take 1 capsule (300 mg total) by mouth 2 (two) times daily.    Dispense:  180 capsule    Refill:  1   losartan (COZAAR) 100 MG tablet    Sig: Take 1 tablet (100 mg total) by mouth daily.    Dispense:  90 tablet    Refill:  1   metFORMIN (GLUCOPHAGE) 1000  MG tablet    Sig: Take 1 tablet (1,000 mg total) by mouth 2 (two) times daily with a meal.    Dispense:  180 tablet    Refill:  0   dapagliflozin propanediol (FARXIGA) 10 MG TABS tablet    Sig: Take 1 tablet (10 mg total) by mouth daily before breakfast.    Dispense:  90 tablet    Refill:  1    Orders Placed This Encounter  Procedures   Flu Vaccine QUAD High Dose(Fluad)   Comprehensive metabolic panel   Hemoglobin A1c   Lipid panel   CBC with Differential/Platelet   Cologuard   Cardiovascular Risk Assessment     Follow-up: Return in about 3 months (around 06/14/2022) for chronic fasting.  Tawny Asal, acting as a Neurosurgeon for Blane Ohara, MD.,have documented all relevant documentation on the behalf of Blane Ohara, MD,as directed by  Blane Ohara, MD while in the  presence of Blane Ohara, MD.   An After Visit Summary was printed and given to the patient.  Blane Ohara, MD [facility] [phone]

## 2022-03-14 NOTE — Assessment & Plan Note (Addendum)
Well controlled.  No changes to medicines. Continue lipitor 20 mg daily.  Continue to work on eating a healthy diet and exercise.  Labs drawn today.   

## 2022-03-14 NOTE — Assessment & Plan Note (Deleted)
Well controlled.  ?No changes to medicines.  ?Continue to work on eating a healthy diet and exercise.  ?Labs drawn today.  ?

## 2022-03-14 NOTE — Assessment & Plan Note (Addendum)
Well controlled.  No changes to medicines. Continue losartan 100 mg daily.  Continue to work on eating a healthy diet and exercise.  Labs drawn today.  

## 2022-03-15 ENCOUNTER — Ambulatory Visit (INDEPENDENT_AMBULATORY_CARE_PROVIDER_SITE_OTHER): Payer: Medicare HMO | Admitting: Family Medicine

## 2022-03-15 ENCOUNTER — Encounter: Payer: Self-pay | Admitting: Family Medicine

## 2022-03-15 VITALS — BP 134/82 | HR 80 | Temp 97.3°F | Resp 16 | Ht 74.0 in | Wt 212.4 lb

## 2022-03-15 DIAGNOSIS — R69 Illness, unspecified: Secondary | ICD-10-CM | POA: Diagnosis not present

## 2022-03-15 DIAGNOSIS — Z23 Encounter for immunization: Secondary | ICD-10-CM

## 2022-03-15 DIAGNOSIS — H5213 Myopia, bilateral: Secondary | ICD-10-CM

## 2022-03-15 DIAGNOSIS — E1159 Type 2 diabetes mellitus with other circulatory complications: Secondary | ICD-10-CM

## 2022-03-15 DIAGNOSIS — K219 Gastro-esophageal reflux disease without esophagitis: Secondary | ICD-10-CM

## 2022-03-15 DIAGNOSIS — E782 Mixed hyperlipidemia: Secondary | ICD-10-CM

## 2022-03-15 DIAGNOSIS — I152 Hypertension secondary to endocrine disorders: Secondary | ICD-10-CM

## 2022-03-15 DIAGNOSIS — Z1211 Encounter for screening for malignant neoplasm of colon: Secondary | ICD-10-CM

## 2022-03-15 DIAGNOSIS — M1A0610 Idiopathic chronic gout, right knee, without tophus (tophi): Secondary | ICD-10-CM | POA: Diagnosis not present

## 2022-03-15 DIAGNOSIS — E1142 Type 2 diabetes mellitus with diabetic polyneuropathy: Secondary | ICD-10-CM | POA: Diagnosis not present

## 2022-03-15 DIAGNOSIS — E291 Testicular hypofunction: Secondary | ICD-10-CM | POA: Diagnosis not present

## 2022-03-15 DIAGNOSIS — F17218 Nicotine dependence, cigarettes, with other nicotine-induced disorders: Secondary | ICD-10-CM

## 2022-03-15 MED ORDER — DAPAGLIFLOZIN PROPANEDIOL 10 MG PO TABS: 10.0000 mg | ORAL_TABLET | Freq: Every day | ORAL | 1 refills | Status: DC

## 2022-03-15 MED ORDER — METFORMIN HCL 1000 MG PO TABS: 1000.0000 mg | ORAL_TABLET | Freq: Two times a day (BID) | ORAL | 0 refills | Status: DC

## 2022-03-15 MED ORDER — ATORVASTATIN CALCIUM 20 MG PO TABS: 20.0000 mg | ORAL_TABLET | Freq: Every day | ORAL | 1 refills | Status: DC

## 2022-03-15 MED ORDER — LOSARTAN POTASSIUM 100 MG PO TABS: 100.0000 mg | ORAL_TABLET | Freq: Every day | ORAL | 1 refills | Status: DC

## 2022-03-15 MED ORDER — GABAPENTIN 300 MG PO CAPS: 300.0000 mg | ORAL_CAPSULE | Freq: Two times a day (BID) | ORAL | 1 refills | Status: DC

## 2022-03-15 MED ORDER — ALLOPURINOL 100 MG PO TABS: 100.0000 mg | ORAL_TABLET | Freq: Every day | ORAL | 0 refills | Status: DC

## 2022-03-15 NOTE — Patient Instructions (Addendum)
Recommend get tetanus vaccine and Shingrix vaccine series at the pharmacy. Start farxiga 10 mg daily. Ordering Cologuard test.  Please return box as soon as possible. Recommend quit smoking. Call back if would like to try medicines.  Recommend decrease alcohol use.

## 2022-03-16 LAB — COMPREHENSIVE METABOLIC PANEL
ALT: 30 IU/L (ref 0–44)
AST: 22 IU/L (ref 0–40)
Albumin/Globulin Ratio: 2.3 — ABNORMAL HIGH (ref 1.2–2.2)
Albumin: 5 g/dL — ABNORMAL HIGH (ref 3.9–4.9)
Alkaline Phosphatase: 94 IU/L (ref 44–121)
BUN/Creatinine Ratio: 15 (ref 10–24)
BUN: 16 mg/dL (ref 8–27)
Bilirubin Total: 0.4 mg/dL (ref 0.0–1.2)
CO2: 25 mmol/L (ref 20–29)
Calcium: 10.1 mg/dL (ref 8.6–10.2)
Chloride: 99 mmol/L (ref 96–106)
Creatinine, Ser: 1.05 mg/dL (ref 0.76–1.27)
Globulin, Total: 2.2 g/dL (ref 1.5–4.5)
Glucose: 163 mg/dL — ABNORMAL HIGH (ref 70–99)
Potassium: 5.5 mmol/L — ABNORMAL HIGH (ref 3.5–5.2)
Sodium: 138 mmol/L (ref 134–144)
Total Protein: 7.2 g/dL (ref 6.0–8.5)
eGFR: 76 mL/min/{1.73_m2} (ref >59)

## 2022-03-16 LAB — HEMOGLOBIN A1C
Est. average glucose Bld gHb Est-mCnc: 180 mg/dL
Hgb A1c MFr Bld: 7.9 % — ABNORMAL HIGH (ref 4.8–5.6)

## 2022-03-16 LAB — LIPID PANEL
Chol/HDL Ratio: 2.6 ratio (ref 0.0–5.0)
Cholesterol, Total: 142 mg/dL (ref 100–199)
HDL: 54 mg/dL (ref >39)
LDL Chol Calc (NIH): 66 mg/dL (ref 0–99)
Triglycerides: 128 mg/dL (ref 0–149)
VLDL Cholesterol Cal: 22 mg/dL (ref 5–40)

## 2022-03-16 LAB — CBC WITH DIFFERENTIAL/PLATELET
Basophils Absolute: 0 10*3/uL (ref 0.0–0.2)
Basos: 1 %
EOS (ABSOLUTE): 0.2 10*3/uL (ref 0.0–0.4)
Eos: 5 %
Hematocrit: 39.9 % (ref 37.5–51.0)
Hemoglobin: 13.6 g/dL (ref 13.0–17.7)
Immature Grans (Abs): 0 10*3/uL (ref 0.0–0.1)
Immature Granulocytes: 0 %
Lymphocytes Absolute: 1.4 10*3/uL (ref 0.7–3.1)
Lymphs: 40 %
MCH: 32.5 pg (ref 26.6–33.0)
MCHC: 34.1 g/dL (ref 31.5–35.7)
MCV: 96 fL (ref 79–97)
Monocytes Absolute: 0.4 10*3/uL (ref 0.1–0.9)
Monocytes: 11 %
Neutrophils Absolute: 1.5 10*3/uL (ref 1.4–7.0)
Neutrophils: 43 %
Platelets: 207 10*3/uL (ref 150–450)
RBC: 4.18 x10E6/uL (ref 4.14–5.80)
RDW: 11.9 % (ref 11.6–15.4)
WBC: 3.4 10*3/uL (ref 3.4–10.8)

## 2022-03-16 LAB — CARDIOVASCULAR RISK ASSESSMENT

## 2022-03-16 NOTE — Progress Notes (Signed)
Blood count normal.  Liver function normal.  Kidney function normal. Potassium elevated.   Cholesterol: good HBA1C: 7.9. worsened. Continue farxiga and metformin. Recommend add actos 15 mg daily.

## 2022-03-17 NOTE — Assessment & Plan Note (Addendum)
Control: not at goal Recommend check sugars fasting daily. Recommend check feet daily. Recommend annual eye exams. Medicines: Continue metformin 1000 mg one twice daily, Increase farxiga 10 mg daily, gabapentin 300 mg twice daily. Add actos 15 mg daily. Continue to work on eating a healthy diet and exercise.  Labs drawn today.    

## 2022-03-17 NOTE — Assessment & Plan Note (Signed)
Continue nexium 40 mg daily.  

## 2022-03-17 NOTE — Assessment & Plan Note (Signed)
Recommend cessation. Patient not ready at this time.

## 2022-03-19 ENCOUNTER — Other Ambulatory Visit: Payer: Self-pay

## 2022-03-19 MED ORDER — PIOGLITAZONE HCL 15 MG PO TABS: 15.0000 mg | ORAL_TABLET | Freq: Every day | ORAL | 0 refills | Status: DC

## 2022-05-16 DIAGNOSIS — H40013 Open angle with borderline findings, low risk, bilateral: Secondary | ICD-10-CM | POA: Diagnosis not present

## 2022-06-07 ENCOUNTER — Telehealth: Payer: Self-pay

## 2022-06-07 NOTE — Progress Notes (Unsigned)
Error

## 2022-06-10 DIAGNOSIS — H40013 Open angle with borderline findings, low risk, bilateral: Secondary | ICD-10-CM | POA: Diagnosis not present

## 2022-06-10 DIAGNOSIS — E119 Type 2 diabetes mellitus without complications: Secondary | ICD-10-CM | POA: Diagnosis not present

## 2022-06-10 LAB — HM DIABETES EYE EXAM

## 2022-06-13 DIAGNOSIS — H5213 Myopia, bilateral: Secondary | ICD-10-CM | POA: Diagnosis not present

## 2022-06-13 DIAGNOSIS — H524 Presbyopia: Secondary | ICD-10-CM | POA: Diagnosis not present

## 2022-06-13 DIAGNOSIS — Z0100 Encounter for examination of eyes and vision without abnormal findings: Secondary | ICD-10-CM | POA: Diagnosis not present

## 2022-06-13 DIAGNOSIS — H52209 Unspecified astigmatism, unspecified eye: Secondary | ICD-10-CM | POA: Diagnosis not present

## 2022-06-17 ENCOUNTER — Encounter: Payer: Self-pay | Admitting: Family Medicine

## 2022-06-20 NOTE — Assessment & Plan Note (Signed)
Control: not at goal Recommend check sugars fasting daily. Recommend check feet daily. Recommend annual eye exams. Medicines: Continue metformin 1000 mg one twice daily, Increase farxiga 10 mg daily, gabapentin 300 mg twice daily. Add actos 15 mg daily. Continue to work on eating a healthy diet and exercise.  Labs drawn today.

## 2022-06-20 NOTE — Assessment & Plan Note (Signed)
Continue nexium 40 mg daily.

## 2022-06-20 NOTE — Assessment & Plan Note (Signed)
The current medical regimen is effective;  continue present plan and medications. Continue testosterone shots every 2 weeks.

## 2022-06-20 NOTE — Assessment & Plan Note (Signed)
Well controlled.  No changes to medicines. Continue lipitor 20 mg daily.  Continue to work on eating a healthy diet and exercise.  Labs drawn today.

## 2022-06-20 NOTE — Assessment & Plan Note (Signed)
Well controlled.  No changes to medicines. Continue losartan 100 mg daily.  Continue to work on eating a healthy diet and exercise.  Labs drawn today.

## 2022-06-20 NOTE — Progress Notes (Unsigned)
Subjective:  Patient ID: Nathaniel Anda Sr., male    DOB: December 09, 1950  Age: 72 y.o. MRN: [id number]  Chief Complaint  Patient presents with   Diabetes   Hyperlipidemia    HPI Diabetes:  Complications: neuropathy Glucose checking: daily Glucose logs: 115-140 Hypoglycemia: no  Most recent A1C: 7.9 Current medications: metformin 1000 mg one twice daily, farxiga 10 mg daily, Actos 15 mg daily Last Eye Exam: 06/10/2022. Glaucoma: eye drops. Foot checks: daily   Hyperlipidemia: Current medications: lipitor 20 mg daily.    Hypertension: Current medications:  Losartan 100 mg daily    GERD: nexium 40 mg daily.   Hypogonadism: testosterone shots every 2 weeks..   Diet: healthy. Exercise: walk a lot at work.       03/15/2022    8:03 AM 12/21/2021    8:02 AM 05/29/2021    8:18 AM 12/16/2020    8:52 AM 04/04/2020    8:27 AM  Depression screen PHQ 2/9  Decreased Interest 0 0 0 0 0  Down, Depressed, Hopeless 0 0 0 0 0  PHQ - 2 Score 0 0 0 0 0  Altered sleeping    0   Tired, decreased energy    0   Change in appetite    0   Feeling bad or failure about yourself     0   Trouble concentrating    0   Moving slowly or fidgety/restless    0   Suicidal thoughts    0   PHQ-9 Score    0          04/04/2020    8:27 AM 12/16/2020    8:53 AM 12/21/2021    8:02 AM 03/15/2022    8:03 AM 06/21/2022    8:01 AM  Fall Risk  Falls in the past year? 0 0 0 0 0  Was there an injury with Fall? 0 0 0 0 0  Fall Risk Category Calculator 0 0 0 0 0  Fall Risk Category (Retired) Low Low Low Low   (RETIRED) Patient Fall Risk Level  Low fall risk Low fall risk Low fall risk   Patient at Risk for Falls Due to No Fall Risks No Fall Risks No Fall Risks No Fall Risks No Fall Risks  Fall risk Follow up Falls evaluation completed;Falls prevention discussed Falls evaluation completed Falls evaluation completed;Falls prevention discussed Falls evaluation completed Falls evaluation completed      Review of  Systems  Constitutional:  Negative for chills, diaphoresis, fatigue and fever.  HENT:  Negative for congestion, ear pain and sore throat.   Respiratory:  Negative for cough and shortness of breath.   Cardiovascular:  Negative for chest pain and leg swelling.  Gastrointestinal:  Negative for abdominal pain, constipation, diarrhea, nausea and vomiting.  Genitourinary:  Negative for dysuria and urgency.  Musculoskeletal:  Negative for arthralgias and myalgias.  Neurological:  Negative for dizziness and headaches.  Psychiatric/Behavioral:  Negative for dysphoric mood.     Current Outpatient Medications on File Prior to Visit  Medication Sig Dispense Refill   atorvastatin (LIPITOR) 20 MG tablet Take 1 tablet (20 mg total) by mouth daily. 90 tablet 1   colchicine 0.6 MG tablet Take 1 tablet (0.6 mg total) by mouth 2 (two) times daily. 14 tablet 1   dapagliflozin propanediol (FARXIGA) 10 MG TABS tablet Take 1 tablet (10 mg total) by mouth daily before breakfast. 90 tablet 1   esomeprazole (NEXIUM) 40 MG capsule Take 40 mg  by mouth daily at 12 noon.     gabapentin (NEURONTIN) 300 MG capsule Take 1 capsule (300 mg total) by mouth 2 (two) times daily. 180 capsule 1   losartan (COZAAR) 100 MG tablet Take 1 tablet (100 mg total) by mouth daily. 90 tablet 1   metFORMIN (GLUCOPHAGE) 1000 MG tablet Take 1 tablet (1,000 mg total) by mouth 2 (two) times daily with a meal. 180 tablet 0   pioglitazone (ACTOS) 15 MG tablet Take 1 tablet (15 mg total) by mouth daily. 90 tablet 0   tadalafil (CIALIS) 10 MG tablet Take 1 tablet (10 mg total) by mouth daily. 30 tablet 3   testosterone cypionate (DEPOTESTOSTERONE CYPIONATE) 200 MG/ML injection Inject 1 mL (200 mg total) into the muscle every 14 (fourteen) days. 2 mL 5   timolol (TIMOPTIC) 0.25 % ophthalmic solution 1 drop 2 (two) times daily.     No current facility-administered medications on file prior to visit.   Past Medical History:  Diagnosis Date    Diabetes mellitus without complication (HCC)    GERD (gastroesophageal reflux disease)    Hyperlipemia    Hypertension    Nicotine dependence, cigarettes, uncomplicated    Onychomycosis of great toe 02/21/2021   Other male erectile dysfunction    Peptic ulcer 2000   Testicular hypofunction    Vitamin D deficiency    Past Surgical History:  Procedure Laterality Date   CATARACT EXTRACTION Left 2017    Family History  Problem Relation Age of Onset   Cancer Father        lung   Cancer Sister        lung   Cancer Brother        throat   Hypertension Other    Diabetes Other    Social History   Socioeconomic History   Marital status: Married    Spouse name: Not on file   Number of children: Not on file   Years of education: Not on file   Highest education level: Not on file  Occupational History   Occupation: Nurse, learning disability  Tobacco Use   Smoking status: Every Day    Packs/day: 0.50    Years: 50.00    Total pack years: 25.00    Types: Cigarettes   Smokeless tobacco: Never   Tobacco comments:    1 ppd for > 30 years.  Substance and Sexual Activity   Alcohol use: Yes    Alcohol/week: 28.0 standard drinks of alcohol    Types: 28 Cans of beer per week    Comment: 4/daily   Drug use: No   Sexual activity: Not on file  Other Topics Concern   Not on file  Social History Narrative   ** Merged History Encounter **       Social Determinants of Health   Financial Resource Strain: High Risk (06/19/2021)   Overall Financial Resource Strain (CARDIA)    Difficulty of Paying Living Expenses: Very hard  Food Insecurity: No Food Insecurity (12/21/2021)   Hunger Vital Sign    Worried About Running Out of Food in the Last Year: Never true    Ran Out of Food in the Last Year: Never true  Transportation Needs: No Transportation Needs (12/21/2021)   PRAPARE - Hydrologist (Medical): No    Lack of Transportation (Non-Medical): No  Physical Activity:  Inactive (06/21/2022)   Exercise Vital Sign    Days of Exercise per Week: 0 days  Minutes of Exercise per Session: 0 min  Stress: No Stress Concern Present (06/21/2022)   [place]    Feeling of Stress : Not at all  Social Connections: Moderately Integrated (06/21/2022)   Social Connection and Isolation Panel [NHANES]    Frequency of Communication with Friends and Family: Three times a week    Frequency of Social Gatherings with Friends and Family: Three times a week    Attends Religious Services: More than 4 times per year    Active Member of Clubs or Organizations: No    Attends Archivist Meetings: Never    Marital Status: Married    Objective:  BP 136/68   Pulse 88   Temp 97.8 F (36.6 C)   Ht 6\' 3"  (1.905 m)   Wt 209 lb (94.8 kg)   SpO2 100%   BMI 26.12 kg/m      06/21/2022    7:56 AM 03/15/2022    7:57 AM 12/05/2021    7:56 AM  BP/Weight  Systolic BP 294 765 465  Diastolic BP 68 82 64  Wt. (Lbs) 209 212.4 206  BMI 26.12 kg/m2 27.27 kg/m2 26.45 kg/m2    Physical Exam Vitals reviewed.  Constitutional:      Appearance: Normal appearance.  Neck:     Vascular: No carotid bruit.  Cardiovascular:     Rate and Rhythm: Normal rate and regular rhythm.     Pulses: Normal pulses.     Heart sounds: Normal heart sounds.  Pulmonary:     Effort: Pulmonary effort is normal.     Breath sounds: Normal breath sounds. No wheezing, rhonchi or rales.  Abdominal:     General: Bowel sounds are normal.     Palpations: Abdomen is soft.     Tenderness: There is no abdominal tenderness.  Neurological:     Mental Status: He is alert.  Psychiatric:        Mood and Affect: Mood normal.        Behavior: Behavior normal.     Diabetic Foot Exam - Simple   Simple Foot Form Diabetic Foot exam was performed with the following findings: Yes 06/21/2022  8:38 AM  Visual Inspection See comments: Yes Sensation  Testing See comments: Yes Pulse Check Posterior Tibialis and Dorsalis pulse intact bilaterally: Yes Comments Calluses BL. Decreased sensation      Lab Results  Component Value Date   WBC 3.4 03/15/2022   HGB 13.6 03/15/2022   HCT 39.9 03/15/2022   PLT 207 03/15/2022   GLUCOSE 163 (H) 03/15/2022   CHOL 142 03/15/2022   TRIG 128 03/15/2022   HDL 54 03/15/2022   LDLCALC 66 03/15/2022   ALT 30 03/15/2022   AST 22 03/15/2022   NA 138 03/15/2022   K 5.5 (H) 03/15/2022   CL 99 03/15/2022   CREATININE 1.05 03/15/2022   BUN 16 03/15/2022   CO2 25 03/15/2022   TSH 2.130 02/21/2021   HGBA1C 7.9 (H) 03/15/2022   MICROALBUR 0 02/21/2021      Assessment & Plan:    Hypertension associated with diabetes ([place]) Assessment & Plan: Well controlled.  No changes to medicines. Continue losartan 100 mg daily.  Continue to work on eating a healthy diet and exercise.  Labs drawn today.    Orders: -     Comprehensive metabolic panel -     CBC with Differential/Platelet  Gastroesophageal reflux disease without esophagitis Assessment & Plan: Continue nexium 40  mg daily.    Hypogonadism in male Assessment & Plan: The current medical regimen is effective;  continue present plan and medications. Continue testosterone shots every 2 weeks.    Diabetic polyneuropathy associated with type 2 diabetes mellitus ([place]) Assessment & Plan: Control: not at goal Recommend check sugars fasting daily. Recommend check feet daily. Recommend annual eye exams. Medicines: Continue metformin 1000 mg one twice daily, Increase farxiga 10 mg daily, gabapentin 300 mg twice daily. Add actos 15 mg daily. Continue to work on eating a healthy diet and exercise.  Labs drawn today.     Orders: -     Hemoglobin A1c -     Microalbumin / creatinine urine ratio  Mixed hyperlipidemia Assessment & Plan: Well controlled.  No changes to medicines. Continue lipitor 20 mg daily.  Continue to work on eating a  healthy diet and exercise.  Labs drawn today.    Orders: -     Lipid panel -     TSH  Idiopathic chronic gout of right knee without tophus -     Allopurinol; Take 1 tablet (100 mg total) by mouth daily.  Dispense: 90 tablet; Refill: 0  Encounter for screening for lung cancer -     CT CHEST LUNG CANCER SCREENING LOW DOSE WO CONTRAST; Future  Colon cancer screening -     Cologuard     Meds ordered this encounter  Medications   allopurinol (ZYLOPRIM) 100 MG tablet    Sig: Take 1 tablet (100 mg total) by mouth daily.    Dispense:  90 tablet    Refill:  0    Orders Placed This Encounter  Procedures   CT CHEST LUNG CA SCREEN LOW DOSE W/O CM   Comprehensive metabolic panel   Hemoglobin A1c   Lipid panel   CBC with Differential/Platelet   TSH   Microalbumin / creatinine urine ratio   Cologuard     Follow-up: Return in about 3 months (around 09/21/2022) for chronic fasting.   I,Marla I Leal-Borjas,acting as a scribe for Rochel Brome, MD.,have documented all relevant documentation on the behalf of Rochel Brome, MD,as directed by  Rochel Brome, MD while in the presence of Rochel Brome, MD.   An After Visit Summary was printed and given to the patient.  Rochel Brome, MD [facility] [phone]

## 2022-06-21 ENCOUNTER — Encounter: Payer: Self-pay | Admitting: Family Medicine

## 2022-06-21 ENCOUNTER — Ambulatory Visit (INDEPENDENT_AMBULATORY_CARE_PROVIDER_SITE_OTHER): Payer: Medicare HMO | Admitting: Family Medicine

## 2022-06-21 VITALS — BP 136/68 | HR 88 | Temp 97.8°F | Ht 75.0 in | Wt 209.0 lb

## 2022-06-21 DIAGNOSIS — E782 Mixed hyperlipidemia: Secondary | ICD-10-CM

## 2022-06-21 DIAGNOSIS — E291 Testicular hypofunction: Secondary | ICD-10-CM

## 2022-06-21 DIAGNOSIS — E1159 Type 2 diabetes mellitus with other circulatory complications: Secondary | ICD-10-CM

## 2022-06-21 DIAGNOSIS — Z122 Encounter for screening for malignant neoplasm of respiratory organs: Secondary | ICD-10-CM | POA: Diagnosis not present

## 2022-06-21 DIAGNOSIS — E1142 Type 2 diabetes mellitus with diabetic polyneuropathy: Secondary | ICD-10-CM | POA: Diagnosis not present

## 2022-06-21 DIAGNOSIS — K219 Gastro-esophageal reflux disease without esophagitis: Secondary | ICD-10-CM

## 2022-06-21 DIAGNOSIS — Z1211 Encounter for screening for malignant neoplasm of colon: Secondary | ICD-10-CM | POA: Diagnosis not present

## 2022-06-21 DIAGNOSIS — I152 Hypertension secondary to endocrine disorders: Secondary | ICD-10-CM | POA: Diagnosis not present

## 2022-06-21 DIAGNOSIS — M1A0610 Idiopathic chronic gout, right knee, without tophus (tophi): Secondary | ICD-10-CM | POA: Diagnosis not present

## 2022-06-21 MED ORDER — ALLOPURINOL 100 MG PO TABS: 100.0000 mg | ORAL_TABLET | Freq: Every day | ORAL | 0 refills | Status: DC

## 2022-06-21 NOTE — Patient Instructions (Signed)
Recommend tetanus (TDAP) and shingles vaccine series at the pharmacy.

## 2022-06-24 LAB — COMPREHENSIVE METABOLIC PANEL
ALT: 33 IU/L (ref 0–44)
AST: 24 IU/L (ref 0–40)
Albumin/Globulin Ratio: 2 (ref 1.2–2.2)
Albumin: 4.7 g/dL (ref 3.9–4.9)
Alkaline Phosphatase: 97 IU/L (ref 44–121)
BUN/Creatinine Ratio: 17 (ref 10–24)
BUN: 21 mg/dL (ref 8–27)
Bilirubin Total: 0.4 mg/dL (ref 0.0–1.2)
CO2: 24 mmol/L (ref 20–29)
Calcium: 10.2 mg/dL (ref 8.6–10.2)
Chloride: 101 mmol/L (ref 96–106)
Creatinine, Ser: 1.23 mg/dL (ref 0.76–1.27)
Globulin, Total: 2.4 g/dL (ref 1.5–4.5)
Glucose: 150 mg/dL — ABNORMAL HIGH (ref 70–99)
Potassium: 5.4 mmol/L — ABNORMAL HIGH (ref 3.5–5.2)
Sodium: 140 mmol/L (ref 134–144)
Total Protein: 7.1 g/dL (ref 6.0–8.5)
eGFR: 63 mL/min/{1.73_m2} (ref >59)

## 2022-06-24 LAB — CBC WITH DIFFERENTIAL/PLATELET
Basophils Absolute: 0 10*3/uL (ref 0.0–0.2)
Basos: 0 %
EOS (ABSOLUTE): 0.2 10*3/uL (ref 0.0–0.4)
Eos: 5 %
Hematocrit: 47.7 % (ref 37.5–51.0)
Hemoglobin: 15.9 g/dL (ref 13.0–17.7)
Immature Grans (Abs): 0 10*3/uL (ref 0.0–0.1)
Immature Granulocytes: 0 %
Lymphocytes Absolute: 1.3 10*3/uL (ref 0.7–3.1)
Lymphs: 39 %
MCH: 32.5 pg (ref 26.6–33.0)
MCHC: 33.3 g/dL (ref 31.5–35.7)
MCV: 98 fL — ABNORMAL HIGH (ref 79–97)
Monocytes Absolute: 0.3 10*3/uL (ref 0.1–0.9)
Monocytes: 10 %
Neutrophils Absolute: 1.6 10*3/uL (ref 1.4–7.0)
Neutrophils: 46 %
Platelets: 172 10*3/uL (ref 150–450)
RBC: 4.89 x10E6/uL (ref 4.14–5.80)
RDW: 11.2 % — ABNORMAL LOW (ref 11.6–15.4)
WBC: 3.5 10*3/uL (ref 3.4–10.8)

## 2022-06-24 LAB — MICROALBUMIN / CREATININE URINE RATIO
Creatinine, Urine: 44.1 mg/dL
Microalb/Creat Ratio: 30 mg/g creat — ABNORMAL HIGH (ref 0–29)
Microalbumin, Urine: 13.1 ug/mL

## 2022-06-24 LAB — LIPID PANEL
Chol/HDL Ratio: 2.7 ratio (ref 0.0–5.0)
Cholesterol, Total: 138 mg/dL (ref 100–199)
HDL: 52 mg/dL (ref >39)
LDL Chol Calc (NIH): 60 mg/dL (ref 0–99)
Triglycerides: 156 mg/dL — ABNORMAL HIGH (ref 0–149)
VLDL Cholesterol Cal: 26 mg/dL (ref 5–40)

## 2022-06-24 LAB — CARDIOVASCULAR RISK ASSESSMENT

## 2022-06-24 LAB — HEMOGLOBIN A1C
Est. average glucose Bld gHb Est-mCnc: 174 mg/dL
Hgb A1c MFr Bld: 7.7 % — ABNORMAL HIGH (ref 4.8–5.6)

## 2022-06-24 LAB — TSH: TSH: 1.28 u[IU]/mL (ref 0.450–4.500)

## 2022-06-24 NOTE — Progress Notes (Signed)
Blood count normal.  Liver function normal.  Kidney function normal.  Thyroid function normal.  Potassium little high at 5.4. Avoid potassium containing foods.  Cholesterol: trigs little high. Rest good.  HBA1C: 7.7. not at goal. Improved a little. Recommend increase actos to 30 mg daily Continue other medications. Spilling protein in urine.

## 2022-06-25 ENCOUNTER — Other Ambulatory Visit: Payer: Self-pay

## 2022-06-25 MED ORDER — PIOGLITAZONE HCL 30 MG PO TABS: 30.0000 mg | ORAL_TABLET | Freq: Every day | ORAL | 0 refills | Status: AC

## 2022-07-01 ENCOUNTER — Other Ambulatory Visit: Payer: Self-pay | Admitting: Family Medicine

## 2022-07-01 DIAGNOSIS — E1142 Type 2 diabetes mellitus with diabetic polyneuropathy: Secondary | ICD-10-CM

## 2022-07-05 ENCOUNTER — Telehealth: Payer: Self-pay

## 2022-07-05 NOTE — Progress Notes (Signed)
Care Management & Coordination Services Pharmacy Team  Reason for Encounter: Diabetes  Contacted patient to discuss diabetes disease state. Spoke with patient on 07/05/2022   Current antihyperglycemic regimen:  Farxiga 10 mg daily Metformin 1000 mg twice daily   Patient verbally confirms he is taking the above medications as directed. Yes  What diet changes have been made to improve diabetes control? Patient is eating healthier and drinking plenty of water.  What recent interventions/DTPs have been made to improve glycemic control:  Patient stated he has been doing great with managing blood sugar. Patient ran out of metformin for a week and was taking once daily instead of twice. Patient will pick up metformin today.  Have there been any recent hospitalizations or ED visits since last visit with PharmD? No  Patient denies hypoglycemic symptoms  Patient denies hyperglycemic symptoms  How often are you checking your blood sugar? once daily  What are your blood sugars ranging?  Fasting: 03-22 143, 03-21 123, 03-20 113, 03-19 183 Before meals: None After meals: None Bedtime: None  During the week, how often does your blood glucose drop below 70? Never  Are you checking your feet daily/regularly? Yes  Adherence Review: Is the patient currently on a STATIN medication? Yes Is the patient currently on ACE/ARB medication? Yes Does the patient have >5 day gap between last estimated fill dates? No   Chart Updates: Recent office visits:  06-21-2022 Rochel Brome, MD. MCV= 98, RDW= 11.2. Glucose= 150, Potassium= 5.4. A1C= 7.7. Trig= 156. Microalb/Creat Ratio= 30. Cologuard and CT chest lung CA screen ordered.  03-15-2022 Rochel Brome, MD. Glucose= 163, Potassium= 5.5, Albumin= 5.0, Albumin/Globulin Ratio= 2.3. A1C= 7.9. INCREASE farxiga 5 mg daily TO 10 mg daily. STOP xigduo. Flu vaccine given.  Recent consult visits:  02-18-2022 Kyung Rudd. (Optometry). Unable to view  encounter.  05-14-2021 Tilles, Osvaldo Shipper, DPM (Podiatry). Visit for diabetic foot exam.  Hospital visits:  None in previous 6 months  Medications: Outpatient Encounter Medications as of 07/05/2022  Medication Sig   allopurinol (ZYLOPRIM) 100 MG tablet Take 1 tablet (100 mg total) by mouth daily.   atorvastatin (LIPITOR) 20 MG tablet Take 1 tablet (20 mg total) by mouth daily.   colchicine 0.6 MG tablet Take 1 tablet (0.6 mg total) by mouth 2 (two) times daily.   dapagliflozin propanediol (FARXIGA) 10 MG TABS tablet Take 1 tablet (10 mg total) by mouth daily before breakfast.   esomeprazole (NEXIUM) 40 MG capsule Take 40 mg by mouth daily at 12 noon.   gabapentin (NEURONTIN) 300 MG capsule Take 1 capsule (300 mg total) by mouth 2 (two) times daily.   losartan (COZAAR) 100 MG tablet Take 1 tablet (100 mg total) by mouth daily.   metFORMIN (GLUCOPHAGE) 1000 MG tablet TAKE 1 TABLET BY MOUTH TWICE DAILY WITH MEALS   pioglitazone (ACTOS) 30 MG tablet Take 1 tablet (30 mg total) by mouth daily.   tadalafil (CIALIS) 10 MG tablet Take 1 tablet (10 mg total) by mouth daily.   testosterone cypionate (DEPOTESTOSTERONE CYPIONATE) 200 MG/ML injection Inject 1 mL (200 mg total) into the muscle every 14 (fourteen) days.   timolol (TIMOPTIC) 0.25 % ophthalmic solution 1 drop 2 (two) times daily.   No facility-administered encounter medications on file as of 07/05/2022.    Recent Relevant Labs: Lab Results  Component Value Date/Time   HGBA1C 7.7 (H) 06/21/2022 08:45 AM   HGBA1C 7.9 (H) 03/15/2022 08:26 AM   MICROALBUR 0 02/21/2021 08:15 AM  MICROALBUR 80 01/19/2020 08:12 AM    Kidney Function Lab Results  Component Value Date/Time   CREATININE 1.23 06/21/2022 08:45 AM   CREATININE 1.05 03/15/2022 08:26 AM   GFRNONAA 66 05/17/2020 08:07 AM   GFRAA 77 05/17/2020 08:07 AM    Star Rating Drugs:  Atorvastatin 20 mg- Last filled 06-08-2022 90 DS. Previous 03-16-2023 90 DS Farxiga 10 mg- Last  filled 06-21-2022 30 DS. Previous 05-16-2022 30 DS Actos 30 mg- Last filled 06-26-2022 90 DS. Previous 03-19-2022 90 DS Metformin 1000 mg- Last filled 07-05-2022 90 DS. Previous 03-15-2022 90 DS Losartan 100 mg- Last filled 06-08-2022 90 DS. Previous 03-14-2022 90 DS.  Care Gaps: Annual wellness visit in last year? Yes Last eye exam / retinopathy screening: Last diabetic foot exam: 02-11-2022 Tdap overdue Shingrix overdue PNA vac overdue Covid booster overdue Cologuard overdue  [place] Clinical Pharmacist Assistant [phone]

## 2022-08-16 ENCOUNTER — Ambulatory Visit (HOSPITAL_BASED_OUTPATIENT_CLINIC_OR_DEPARTMENT_OTHER)
Admission: RE | Admit: 2022-08-16 | Discharge: 2022-08-16 | Disposition: A | Discharge status: Home / Self Care | Payer: Medicare HMO | Source: Ambulatory Visit | Attending: Family Medicine | Admitting: Family Medicine

## 2022-08-16 DIAGNOSIS — Z87891 Personal history of nicotine dependence: Secondary | ICD-10-CM | POA: Diagnosis not present

## 2022-08-16 DIAGNOSIS — Z122 Encounter for screening for malignant neoplasm of respiratory organs: Secondary | ICD-10-CM | POA: Diagnosis not present

## 2022-08-23 DIAGNOSIS — Z7984 Long term (current) use of oral hypoglycemic drugs: Secondary | ICD-10-CM | POA: Diagnosis not present

## 2022-08-23 DIAGNOSIS — E1142 Type 2 diabetes mellitus with diabetic polyneuropathy: Secondary | ICD-10-CM | POA: Diagnosis not present

## 2022-08-23 DIAGNOSIS — B351 Tinea unguium: Secondary | ICD-10-CM | POA: Diagnosis not present

## 2022-08-30 ENCOUNTER — Telehealth: Payer: Self-pay

## 2022-08-30 NOTE — Progress Notes (Signed)
Care Management & Coordination Services Pharmacy Team  Reason for Encounter: Appointment Reminder  Contacted patient to confirm telephone appointment with Artelia Laroche, PharmD on 09/03/22 at 11:00 am. Spoke with patient on 08/30/2022   Do you have any problems getting your medications? Yes, pt stated some of his medications get on back order through the pharmacy and sometimes there is a gap in between fills but has no other issues   What is your top health concern you would like to discuss at your upcoming visit? Pt would like to discuss blood sugars   Have you seen any other providers since your last visit with PCP? Yes, he saw his podiatrist.   Chart review:  Recent office visits:  None  Recent consult visits:  08/23/22 Malachy Mood DPM. (Podiatry). Seen for Tinea Unguium. No med changes.   Hospital visits:  None   Star Rating Drugs:  Medication:  Last Fill: Day Supply Atorvastatin   06/08/22-03/15/22  90ds Farxiga   08/19/22-07/24/22  30ds Losartan  08/19/22-06/08/22  90ds Metformin   07/02/22-03/15/22  90ds Pioglitazone   06/26/22-03/19/22  90ds  Care Gaps: Annual wellness visit in last year? Yes  If Diabetic: Last eye exam / retinopathy screening:06/10/22 Last diabetic foot exam:06/21/22   Roxana Hires, CMA Clinical Pharmacist Assistant  [phone]

## 2022-08-31 ENCOUNTER — Other Ambulatory Visit: Payer: Self-pay | Admitting: Family Medicine

## 2022-08-31 DIAGNOSIS — E1142 Type 2 diabetes mellitus with diabetic polyneuropathy: Secondary | ICD-10-CM

## 2022-09-03 ENCOUNTER — Telehealth: Payer: Self-pay

## 2022-09-03 ENCOUNTER — Ambulatory Visit: Payer: Medicare HMO

## 2022-09-03 NOTE — Progress Notes (Signed)
Patient assistance application filled out with [employer] patient assistance program for Farxiga, placing application in the mail to patient this week.   Billee Cashing, CMA Clinical Pharmacist Assistant [phone]

## 2022-09-03 NOTE — Patient Outreach (Signed)
  Care Management   Follow Up Note   09/03/2022 Name: Nathaniel Obannon Sr. MRN: [id number] DOB: 04/07/1951   Referred by: Blane Ohara, MD Reason for referral : Care Coordination   Spoke with patient but he forgot about appt, was at work  Follow Up Plan: The patient has been provided with contact information for the care management team and has been advised to call with any health related questions or concerns.   Artelia Laroche, Pharm.D. - [phone]    Patient states Nathaniel Nicholson is expensive. Will do PAP  $35,000/year for him $30,000/year  for his wife

## 2022-09-17 ENCOUNTER — Other Ambulatory Visit: Payer: Self-pay | Admitting: Family Medicine

## 2022-09-17 ENCOUNTER — Telehealth: Payer: Self-pay

## 2022-09-17 DIAGNOSIS — E1142 Type 2 diabetes mellitus with diabetic polyneuropathy: Secondary | ICD-10-CM

## 2022-09-17 NOTE — Progress Notes (Signed)
Care Management & Coordination Services Pharmacy Team  Reason for Encounter: Diabetes  Contacted patient to discuss diabetes disease state. Spoke with patient on 09/20/2022   Current antihyperglycemic regimen:  Metformin 1000 mg twice daily Farxiga 10 mg daily   Patient verbally confirms he is taking the above medications as directed. Yes  What diet changes have been made to improve diabetes control? Patient stated he eats fruits and watches his sweet intake  What recent interventions/DTPs have been made to improve glycemic control:  None  Have there been any recent hospitalizations or ED visits since last visit with PharmD? No  Patient denies hypoglycemic symptoms  Patient denies hyperglycemic symptoms  How often are you checking your blood sugar? once daily  What are your blood sugars ranging?  Fasting: 05-31 122, 06-01 126, 06-02 134, 06-03 118, 06-04 144 Before meals: None After meals: None Bedtime: None  During the week, how often does your blood glucose drop below 70? Never  Are you checking your feet daily/regularly? Yes  Adherence Review: Is the patient currently on a STATIN medication? Yes Is the patient currently on ACE/ARB medication? Yes Does the patient have >5 day gap between last estimated fill dates? No   Chart Updates: Recent office visits:  None  Recent consult visits:  None  Hospital visits:  None in previous 6 months  Medications: Outpatient Encounter Medications as of 09/17/2022  Medication Sig   allopurinol (ZYLOPRIM) 100 MG tablet Take 1 tablet (100 mg total) by mouth daily.   atorvastatin (LIPITOR) 20 MG tablet Take 1 tablet (20 mg total) by mouth daily.   colchicine 0.6 MG tablet Take 1 tablet (0.6 mg total) by mouth 2 (two) times daily.   esomeprazole (NEXIUM) 40 MG capsule Take 40 mg by mouth daily at 12 noon.   FARXIGA 10 MG TABS tablet TAKE 1 TABLET BY MOUTH ONCE DAILY BEFORE BREAKFAST   gabapentin (NEURONTIN) 300 MG capsule Take 1  capsule by mouth twice daily   losartan (COZAAR) 100 MG tablet Take 1 tablet (100 mg total) by mouth daily.   metFORMIN (GLUCOPHAGE) 1000 MG tablet TAKE 1 TABLET BY MOUTH TWICE DAILY WITH MEALS   pioglitazone (ACTOS) 30 MG tablet Take 1 tablet (30 mg total) by mouth daily.   tadalafil (CIALIS) 10 MG tablet Take 1 tablet (10 mg total) by mouth daily.   testosterone cypionate (DEPOTESTOSTERONE CYPIONATE) 200 MG/ML injection Inject 1 mL (200 mg total) into the muscle every 14 (fourteen) days.   timolol (TIMOPTIC) 0.25 % ophthalmic solution 1 drop 2 (two) times daily.   No facility-administered encounter medications on file as of 09/17/2022.    Recent Relevant Labs: Lab Results  Component Value Date/Time   HGBA1C 7.7 (H) 06/21/2022 08:45 AM   HGBA1C 7.9 (H) 03/15/2022 08:26 AM   MICROALBUR 0 02/21/2021 08:15 AM   MICROALBUR 80 01/19/2020 08:12 AM    Kidney Function Lab Results  Component Value Date/Time   CREATININE 1.23 06/21/2022 08:45 AM   CREATININE 1.05 03/15/2022 08:26 AM   GFRNONAA 66 05/17/2020 08:07 AM   GFRAA 77 05/17/2020 08:07 AM  09-17-2022: 1st attempt left VM  Star Rating Drugs:  Atorvastatin 20 mg- Last filled 09-01-2022 90 DS. Previous 06-08-2022 90 DS Farxiga 10 mg- Last filled 08-19-2022 30 DS. Previous 07-24-2022 30 DS Actos 30 mg- Last filled 06-26-2022 90 DS. Previous 03-19-2022 90 DS Metformin 1000 mg- Last filled 07-02-2022 90 DS. Previous 03-15-2022 90 DS Losartan 100 mg- Last filled 08-19-2022 90 DS. Previous 06-08-2022  90 DS.  Care Gaps: Annual wellness visit in last year? Yes Last eye exam / retinopathy screening: 06-10-2022 Last diabetic foot exam: 02-11-2022  Tdap overdue Shingrix overdue PNA vac overdue Covid booster overdue Cologuard overdue  Huey Romans [facility] Clinical Pharmacist Assistant [phone]

## 2022-09-27 ENCOUNTER — Other Ambulatory Visit: Payer: Self-pay | Admitting: Family Medicine

## 2022-09-27 DIAGNOSIS — E1142 Type 2 diabetes mellitus with diabetic polyneuropathy: Secondary | ICD-10-CM

## 2022-10-06 ENCOUNTER — Other Ambulatory Visit: Payer: Self-pay | Admitting: Family Medicine

## 2022-10-06 DIAGNOSIS — M1A0610 Idiopathic chronic gout, right knee, without tophus (tophi): Secondary | ICD-10-CM

## 2022-10-08 ENCOUNTER — Other Ambulatory Visit: Payer: Self-pay | Admitting: Family Medicine

## 2022-10-08 DIAGNOSIS — E1142 Type 2 diabetes mellitus with diabetic polyneuropathy: Secondary | ICD-10-CM

## 2022-10-18 ENCOUNTER — Ambulatory Visit: Payer: Medicare HMO | Admitting: Family Medicine

## 2022-10-31 NOTE — Assessment & Plan Note (Addendum)
Control: not at goal Recommend check sugars fasting daily. Recommend check feet daily. Recommend annual eye exams. Medicines: Continue metformin 1000 mg one twice daily, Increase farxiga 10 mg daily, gabapentin 300 mg twice daily, and actos 15 mg daily. Continue to work on eating a healthy diet and exercise.  Labs drawn today.

## 2022-10-31 NOTE — Assessment & Plan Note (Signed)
Well controlled.  No changes to medicines. Continue lipitor 20 mg daily.  Continue to work on eating a healthy diet and exercise.  Labs drawn today.   

## 2022-10-31 NOTE — Progress Notes (Unsigned)
Subjective:  Patient ID: Nathaniel Bunde Sr., male    DOB: 1951-01-05  Age: 72 y.o. MRN: [id number]  Chief Complaint  Patient presents with   Medical Management of Chronic Issues    HPI Diabetes:  Complications: neuropathy Glucose checking: daily Glucose logs: 115-140 Hypoglycemia: no  Most recent A1C: 7.9 Current medications: metformin 1000 mg one twice daily, farxiga 10 mg daily, Actos 15 mg daily Last Eye Exam: 06/10/2022. Glaucoma: eye drops. Foot checks: daily   Hyperlipidemia: Current medications: lipitor 20 mg daily.    Hypertension: Current medications:  Losartan 100 mg daily    GERD: nexium 40 mg daily.   Hypogonadism: testosterone shots every 2 weeks..   Diet: healthy. Exercise: walk a lot at work.      03/15/2022    8:03 AM 12/21/2021    8:02 AM 05/29/2021    8:18 AM 12/16/2020    8:52 AM 04/04/2020    8:27 AM  Depression screen PHQ 2/9  Decreased Interest 0 0 0 0 0  Down, Depressed, Hopeless 0 0 0 0 0  PHQ - 2 Score 0 0 0 0 0  Altered sleeping    0   Tired, decreased energy    0   Change in appetite    0   Feeling bad or failure about yourself     0   Trouble concentrating    0   Moving slowly or fidgety/restless    0   Suicidal thoughts    0   PHQ-9 Score    0         06/21/2022    8:01 AM  Fall Risk   Falls in the past year? 0  Number falls in past yr: 0  Injury with Fall? 0  Risk for fall due to : No Fall Risks  Follow up Falls evaluation completed    Patient Care Team: Blane Ohara, MD as PCP - General (Family Medicine) Zettie Pho, [facility] (Inactive) as Pharmacist (Pharmacist)   Review of Systems  Constitutional:  Negative for chills, fatigue and fever.  HENT:  Negative for congestion, ear pain and sore throat.   Respiratory:  Negative for cough and shortness of breath.   Cardiovascular:  Negative for chest pain.  Gastrointestinal:  Negative for abdominal pain, constipation, diarrhea, nausea and vomiting.  Endocrine: Negative for  polydipsia, polyphagia and polyuria.  Genitourinary:  Negative for dysuria and frequency.  Musculoskeletal:  Negative for arthralgias and myalgias.  Neurological:  Negative for dizziness and headaches.  Psychiatric/Behavioral:  Negative for dysphoric mood.        No dysphoria    Current Outpatient Medications on File Prior to Visit  Medication Sig Dispense Refill   allopurinol (ZYLOPRIM) 100 MG tablet Take 1 tablet by mouth once daily 90 tablet 0   atorvastatin (LIPITOR) 20 MG tablet Take 1 tablet (20 mg total) by mouth daily. 90 tablet 1   colchicine 0.6 MG tablet Take 1 tablet (0.6 mg total) by mouth 2 (two) times daily. 14 tablet 1   esomeprazole (NEXIUM) 40 MG capsule Take 40 mg by mouth daily at 12 noon.     FARXIGA 10 MG TABS tablet TAKE 1 TABLET BY MOUTH ONCE DAILY BEFORE BREAKFAST 90 tablet 0   gabapentin (NEURONTIN) 300 MG capsule Take 1 capsule by mouth twice daily 180 capsule 0   losartan (COZAAR) 100 MG tablet Take 1 tablet (100 mg total) by mouth daily. 90 tablet 1   metFORMIN (GLUCOPHAGE) 1000 MG tablet  TAKE 1 TABLET BY MOUTH TWICE DAILY WITH MEALS 180 tablet 0   pioglitazone (ACTOS) 30 MG tablet Take 1 tablet (30 mg total) by mouth daily. 90 tablet 0   tadalafil (CIALIS) 10 MG tablet Take 1 tablet (10 mg total) by mouth daily. 30 tablet 3   testosterone cypionate (DEPOTESTOSTERONE CYPIONATE) 200 MG/ML injection Inject 1 mL (200 mg total) into the muscle every 14 (fourteen) days. 2 mL 5   timolol (TIMOPTIC) 0.25 % ophthalmic solution 1 drop 2 (two) times daily.     No current facility-administered medications on file prior to visit.   Past Medical History:  Diagnosis Date   Diabetes mellitus without complication (HCC)    GERD (gastroesophageal reflux disease)    Hyperlipemia    Hypertension    Nicotine dependence, cigarettes, uncomplicated    Onychomycosis of great toe 02/21/2021   Other male erectile dysfunction    Peptic ulcer 2000   Testicular hypofunction     Vitamin D deficiency    Past Surgical History:  Procedure Laterality Date   CATARACT EXTRACTION Left 2017    Family History  Problem Relation Age of Onset   Cancer Father        lung   Cancer Sister        lung   Cancer Brother        throat   Hypertension Other    Diabetes Other    Social History   Socioeconomic History   Marital status: Married    Spouse name: Not on file   Number of children: Not on file   Years of education: Not on file   Highest education level: Not on file  Occupational History   Occupation: Marine scientist  Tobacco Use   Smoking status: Every Day    Current packs/day: 0.50    Average packs/day: 0.5 packs/day for 50.0 years (25.0 ttl pk-yrs)    Types: Cigarettes   Smokeless tobacco: Never   Tobacco comments:    1 ppd for > 30 years.  Substance and Sexual Activity   Alcohol use: Yes    Alcohol/week: 28.0 standard drinks of alcohol    Types: 28 Cans of beer per week    Comment: 4/daily   Drug use: No   Sexual activity: Not on file  Other Topics Concern   Not on file  Social History Narrative   ** Merged History Encounter **       Social Determinants of Health   Financial Resource Strain: High Risk (06/19/2021)   Overall Financial Resource Strain (CARDIA)    Difficulty of Paying Living Expenses: Very hard  Food Insecurity: No Food Insecurity (12/21/2021)   Hunger Vital Sign    Worried About Running Out of Food in the Last Year: Never true    Ran Out of Food in the Last Year: Never true  Transportation Needs: No Transportation Needs (12/21/2021)   PRAPARE - Administrator, Civil Service (Medical): No    Lack of Transportation (Non-Medical): No  Physical Activity: Inactive (06/21/2022)   Exercise Vital Sign    Days of Exercise per Week: 0 days    Minutes of Exercise per Session: 0 min  Stress: No Stress Concern Present (06/21/2022)   [employer] of Occupational Health - Occupational Stress Questionnaire    Feeling of Stress  : Not at all  Social Connections: Moderately Integrated (06/21/2022)   Social Connection and Isolation Panel [NHANES]    Frequency of Communication with Friends and Family: Three  times a week    Frequency of Social Gatherings with Friends and Family: Three times a week    Attends Religious Services: More than 4 times per year    Active Member of Clubs or Organizations: No    Attends Banker Meetings: Never    Marital Status: Married    Objective:  There were no vitals taken for this visit.     06/21/2022    7:56 AM 03/15/2022    7:57 AM 12/05/2021    7:56 AM  BP/Weight  Systolic BP 136 134 124  Diastolic BP 68 82 64  Wt. (Lbs) 209 212.4 206  BMI 26.12 kg/m2 27.27 kg/m2 26.45 kg/m2    Physical Exam Constitutional:      Appearance: Normal appearance.  HENT:     Right Ear: Tympanic membrane, ear canal and external ear normal.     Left Ear: Tympanic membrane, ear canal and external ear normal.     Nose: Nose normal. No congestion or rhinorrhea.     Mouth/Throat:     Mouth: Mucous membranes are moist.     Pharynx: No oropharyngeal exudate or posterior oropharyngeal erythema.  Cardiovascular:     Rate and Rhythm: Normal rate and regular rhythm.     Heart sounds: Normal heart sounds.  Pulmonary:     Effort: Pulmonary effort is normal. No respiratory distress.     Breath sounds: Normal breath sounds. No wheezing, rhonchi or rales.  Abdominal:     General: Bowel sounds are normal.     Palpations: Abdomen is soft.     Tenderness: There is no abdominal tenderness.  Lymphadenopathy:     Cervical: Cervical adenopathy (right) present.  Neurological:     Mental Status: He is alert.  Psychiatric:        Mood and Affect: Mood normal.        Behavior: Behavior normal.     Diabetic Foot Exam - Simple   Simple Foot Form Diabetic Foot exam was performed with the following findings: Yes 11/01/2022  8:00 AM  Visual Inspection No deformities, no ulcerations, no other skin  breakdown bilaterally: Yes Sensation Testing Intact to touch and monofilament testing bilaterally: Yes Pulse Check Posterior Tibialis and Dorsalis pulse intact bilaterally: Yes Comments      Lab Results  Component Value Date   WBC 3.1 (L) 11/01/2022   HGB 13.9 11/01/2022   HCT 41.8 11/01/2022   PLT 209 11/01/2022   GLUCOSE 146 (H) 11/01/2022   CHOL 202 (H) 11/01/2022   TRIG 149 11/01/2022   HDL 46 11/01/2022   LDLCALC 129 (H) 11/01/2022   ALT 22 11/01/2022   AST 16 11/01/2022   NA 141 11/01/2022   K 5.1 11/01/2022   CL 103 11/01/2022   CREATININE 1.16 11/01/2022   BUN 19 11/01/2022   CO2 25 11/01/2022   TSH 1.280 06/21/2022   HGBA1C 7.1 (H) 11/01/2022   MICROALBUR 0 02/21/2021      Assessment & Plan:    Hypertension associated with diabetes (HCC) Assessment & Plan: Well controlled.  No changes to medicines. Continue losartan 100 mg daily.  Continue to work on eating a healthy diet and exercise.  Labs drawn today.    Orders: -     CBC with Differential/Platelet -     Comprehensive metabolic panel  Diabetic polyneuropathy associated with type 2 diabetes mellitus (HCC) Assessment & Plan: Control: not at goal Recommend check sugars fasting daily. Recommend check feet daily. Recommend annual eye exams.  Medicines: Continue metformin 1000 mg one twice daily, Increase farxiga 10 mg daily, gabapentin 300 mg twice daily. Add actos 15 mg daily. Continue to work on eating a healthy diet and exercise.  Labs drawn today.     Orders: -     Hemoglobin A1c -     Microalbumin / creatinine urine ratio  Hypogonadism in male Assessment & Plan: The current medical regimen is effective;  continue present plan and medications. Continue testosterone shots every 2 weeks.   Orders: -     Testosterone,Free and Total -     PSA  Mixed hyperlipidemia Assessment & Plan: Well controlled.  No changes to medicines. Continue lipitor 20 mg daily.  Continue to work on eating a  healthy diet and exercise.  Labs drawn today.    Orders: -     Lipid panel  Dysuria Assessment & Plan: Order UA.  Orders: -     POCT URINALYSIS DIP (CLINITEK)  Cigarette nicotine dependence with other nicotine-induced disorder Assessment & Plan: Recommend cessation. Patient not ready at this time.       No orders of the defined types were placed in this encounter.   Orders Placed This Encounter  Procedures   CBC with Differential/Platelet   Comprehensive metabolic panel   Hemoglobin A1c   Lipid panel   Testosterone,Free and Total   PSA   Microalbumin / creatinine urine ratio   POCT URINALYSIS DIP (CLINITEK)     Follow-up: Return in about 3 months (around 02/01/2023) for chronic fasting.   I,Nathaniel Nicholson,acting as a scribe for Blane Ohara, MD.,have documented all relevant documentation on the behalf of Blane Ohara, MD,as directed by  Blane Ohara, MD while in the presence of Blane Ohara, MD.   An After Visit Summary was printed and given to the patient.  Blane Ohara, MD [facility] [phone]

## 2022-10-31 NOTE — Assessment & Plan Note (Signed)
Well controlled.  No changes to medicines. Continue losartan 100 mg daily.  Continue to work on eating a healthy diet and exercise.  Labs drawn today.  

## 2022-10-31 NOTE — Assessment & Plan Note (Signed)
The current medical regimen is effective;  continue present plan and medications. Continue testosterone shots every 2 weeks.  

## 2022-11-01 ENCOUNTER — Ambulatory Visit (INDEPENDENT_AMBULATORY_CARE_PROVIDER_SITE_OTHER): Payer: Medicare HMO | Admitting: Family Medicine

## 2022-11-01 VITALS — BP 120/80 | HR 74 | Temp 96.3°F | Resp 18 | Ht 75.0 in | Wt 206.2 lb

## 2022-11-01 DIAGNOSIS — R300 Dysuria: Secondary | ICD-10-CM | POA: Diagnosis not present

## 2022-11-01 DIAGNOSIS — E1142 Type 2 diabetes mellitus with diabetic polyneuropathy: Secondary | ICD-10-CM | POA: Diagnosis not present

## 2022-11-01 DIAGNOSIS — I152 Hypertension secondary to endocrine disorders: Secondary | ICD-10-CM

## 2022-11-01 DIAGNOSIS — E291 Testicular hypofunction: Secondary | ICD-10-CM | POA: Diagnosis not present

## 2022-11-01 DIAGNOSIS — E1159 Type 2 diabetes mellitus with other circulatory complications: Secondary | ICD-10-CM | POA: Diagnosis not present

## 2022-11-01 DIAGNOSIS — E782 Mixed hyperlipidemia: Secondary | ICD-10-CM | POA: Diagnosis not present

## 2022-11-01 DIAGNOSIS — F17218 Nicotine dependence, cigarettes, with other nicotine-induced disorders: Secondary | ICD-10-CM | POA: Diagnosis not present

## 2022-11-02 NOTE — Assessment & Plan Note (Signed)
Recommend cessation. Patient not ready at this time.

## 2022-11-02 NOTE — Assessment & Plan Note (Signed)
Order UA

## 2022-11-03 LAB — HEMOGLOBIN A1C
Est. average glucose Bld gHb Est-mCnc: 157 mg/dL
Hgb A1c MFr Bld: 7.1 % — ABNORMAL HIGH (ref 4.8–5.6)

## 2022-11-03 LAB — CBC WITH DIFFERENTIAL/PLATELET
Basophils Absolute: 0 10*3/uL (ref 0.0–0.2)
Basos: 0 %
EOS (ABSOLUTE): 0.1 10*3/uL (ref 0.0–0.4)
Eos: 4 %
Hematocrit: 41.8 % (ref 37.5–51.0)
Hemoglobin: 13.9 g/dL (ref 13.0–17.7)
Immature Grans (Abs): 0 10*3/uL (ref 0.0–0.1)
Immature Granulocytes: 0 %
Lymphocytes Absolute: 1 10*3/uL (ref 0.7–3.1)
Lymphs: 32 %
MCH: 32.8 pg (ref 26.6–33.0)
MCHC: 33.3 g/dL (ref 31.5–35.7)
MCV: 99 fL — ABNORMAL HIGH (ref 79–97)
Monocytes Absolute: 0.3 10*3/uL (ref 0.1–0.9)
Monocytes: 11 %
Neutrophils Absolute: 1.6 10*3/uL (ref 1.4–7.0)
Neutrophils: 53 %
Platelets: 209 10*3/uL (ref 150–450)
RBC: 4.24 x10E6/uL (ref 4.14–5.80)
RDW: 12.1 % (ref 11.6–15.4)
WBC: 3.1 10*3/uL — ABNORMAL LOW (ref 3.4–10.8)

## 2022-11-03 LAB — LIPID PANEL
Chol/HDL Ratio: 4.4 ratio (ref 0.0–5.0)
Cholesterol, Total: 202 mg/dL — ABNORMAL HIGH (ref 100–199)
HDL: 46 mg/dL (ref >39)
LDL Chol Calc (NIH): 129 mg/dL — ABNORMAL HIGH (ref 0–99)
Triglycerides: 149 mg/dL (ref 0–149)
VLDL Cholesterol Cal: 27 mg/dL (ref 5–40)

## 2022-11-03 LAB — COMPREHENSIVE METABOLIC PANEL
ALT: 22 IU/L (ref 0–44)
AST: 16 IU/L (ref 0–40)
Albumin: 4.3 g/dL (ref 3.9–4.9)
Alkaline Phosphatase: 74 IU/L (ref 44–121)
BUN/Creatinine Ratio: 16 (ref 10–24)
BUN: 19 mg/dL (ref 8–27)
Bilirubin Total: 0.2 mg/dL (ref 0.0–1.2)
CO2: 25 mmol/L (ref 20–29)
Calcium: 9.9 mg/dL (ref 8.6–10.2)
Chloride: 103 mmol/L (ref 96–106)
Creatinine, Ser: 1.16 mg/dL (ref 0.76–1.27)
Globulin, Total: 2.4 g/dL (ref 1.5–4.5)
Glucose: 146 mg/dL — ABNORMAL HIGH (ref 70–99)
Potassium: 5.1 mmol/L (ref 3.5–5.2)
Sodium: 141 mmol/L (ref 134–144)
Total Protein: 6.7 g/dL (ref 6.0–8.5)
eGFR: 68 mL/min/{1.73_m2} (ref >59)

## 2022-11-03 LAB — TESTOSTERONE,FREE AND TOTAL
Testosterone, Free: 8.4 pg/mL (ref 6.6–18.1)
Testosterone: 296 ng/dL (ref 264–916)

## 2022-11-03 LAB — PSA: Prostate Specific Ag, Serum: 0.7 ng/mL (ref 0.0–4.0)

## 2022-11-10 ENCOUNTER — Encounter: Payer: Self-pay | Admitting: Family Medicine

## 2022-11-30 ENCOUNTER — Other Ambulatory Visit: Payer: Self-pay | Admitting: Family Medicine

## 2022-11-30 DIAGNOSIS — E1142 Type 2 diabetes mellitus with diabetic polyneuropathy: Secondary | ICD-10-CM

## 2022-12-01 ENCOUNTER — Other Ambulatory Visit: Payer: Self-pay | Admitting: Family Medicine

## 2022-12-01 DIAGNOSIS — E782 Mixed hyperlipidemia: Secondary | ICD-10-CM

## 2022-12-03 ENCOUNTER — Other Ambulatory Visit: Payer: Self-pay

## 2022-12-25 ENCOUNTER — Ambulatory Visit (INDEPENDENT_AMBULATORY_CARE_PROVIDER_SITE_OTHER): Payer: Medicare HMO

## 2022-12-25 DIAGNOSIS — Z0000 Encounter for general adult medical examination without abnormal findings: Secondary | ICD-10-CM

## 2022-12-25 NOTE — Progress Notes (Signed)
Subjective:   Nathaniel Comas Sr. is a 72 y.o. male who presents for Medicare Annual/Subsequent preventive examination.  This wellness visit is conducted by a nurse.  The patient's medications were reviewed and reconciled since the patient's last visit.  History details were provided by the patient.  The history appears to be reliable.    Patient's last AWV was 1 year ago.   Medical History: Patient history and Family history was reviewed  Medications, Allergies, and preventative health maintenance was reviewed and updated.   Visit Complete: Virtual  I connected with  Nathaniel Bunde Sr. on 12/25/22 by a audio enabled telemedicine application and verified that I am speaking with the correct person using two identifiers.  Patient Location: Home  Provider Location: Office/Clinic  I discussed the limitations of evaluation and management by telemedicine. The patient expressed understanding and agreed to proceed.  Cardiac Risk Factors include: advanced age (>43men, >70 women);diabetes mellitus;dyslipidemia;male gender;smoking/ tobacco exposure;hypertension     Objective:    Today's Vitals   12/25/22 1610  PainSc: 0-No pain  Patient was unable to self-report due to a lack of equipment at home via telehealth  There is no height or weight on file to calculate BMI.     09/09/2019    8:22 AM 01/12/2018    3:11 PM 01/07/2017    6:19 PM 08/06/2016    5:57 PM  Advanced Directives  Does Patient Have a Medical Advance Directive? No No No No  Would patient like information on creating a medical advance directive? Yes (MAU/Ambulatory/Procedural Areas - Information given)       Current Medications (verified) Outpatient Encounter Medications as of 12/25/2022  Medication Sig   allopurinol (ZYLOPRIM) 100 MG tablet Take 1 tablet by mouth once daily   atorvastatin (LIPITOR) 20 MG tablet Take 1 tablet by mouth once daily   colchicine 0.6 MG tablet Take 1 tablet (0.6 mg total) by mouth 2 (two) times  daily.   esomeprazole (NEXIUM) 40 MG capsule Take 40 mg by mouth daily at 12 noon.   FARXIGA 10 MG TABS tablet TAKE 1 TABLET BY MOUTH ONCE DAILY BEFORE BREAKFAST   gabapentin (NEURONTIN) 300 MG capsule Take 1 capsule by mouth twice daily   losartan (COZAAR) 100 MG tablet Take 1 tablet (100 mg total) by mouth daily.   metFORMIN (GLUCOPHAGE) 1000 MG tablet TAKE 1 TABLET BY MOUTH TWICE DAILY WITH MEALS   pioglitazone (ACTOS) 30 MG tablet Take 1 tablet (30 mg total) by mouth daily.   tadalafil (CIALIS) 10 MG tablet Take 1 tablet (10 mg total) by mouth daily.   testosterone cypionate (DEPOTESTOSTERONE CYPIONATE) 200 MG/ML injection Inject 1 mL (200 mg total) into the muscle every 14 (fourteen) days.   timolol (TIMOPTIC) 0.25 % ophthalmic solution 1 drop 2 (two) times daily.   No facility-administered encounter medications on file as of 12/25/2022.    Allergies (verified) Patient has no known allergies.   History: Past Medical History:  Diagnosis Date   Diabetes mellitus without complication (HCC)    GERD (gastroesophageal reflux disease)    Hyperlipemia    Hypertension    Nicotine dependence, cigarettes, uncomplicated    Onychomycosis of great toe 02/21/2021   Other male erectile dysfunction    Peptic ulcer 2000   Testicular hypofunction    Vitamin D deficiency    Past Surgical History:  Procedure Laterality Date   CATARACT EXTRACTION Left 2017   Family History  Problem Relation Age of Onset   Cancer Father  lung   Cancer Sister        lung   Cancer Brother        throat   Hypertension Other    Diabetes Other    Social History   Socioeconomic History   Marital status: Married    Spouse name: Nathaniel Nicholson   Number of children: Not on file   Years of education: Not on file   Highest education level: Not on file  Occupational History   Occupation: Marine scientist  Tobacco Use   Smoking status: Every Day    Current packs/day: 0.50    Average packs/day: 0.5  packs/day for 50.0 years (25.0 ttl pk-yrs)    Types: Cigarettes   Smokeless tobacco: Never   Tobacco comments:    1 ppd for > 30 years.  Vaping Use   Vaping status: Never Used  Substance and Sexual Activity   Alcohol use: Yes    Alcohol/week: 28.0 standard drinks of alcohol    Types: 28 Cans of beer per week    Comment: 4/daily   Drug use: No   Sexual activity: Not on file  Other Topics Concern   Not on file  Social History Narrative   Not on file   Social Determinants of Health   Financial Resource Strain: Low Risk  (12/25/2022)   Overall Financial Resource Strain (CARDIA)    Difficulty of Paying Living Expenses: Not very hard  Food Insecurity: No Food Insecurity (12/25/2022)   Hunger Vital Sign    Worried About Running Out of Food in the Last Year: Never true    Ran Out of Food in the Last Year: Never true  Transportation Needs: No Transportation Needs (12/25/2022)   PRAPARE - Administrator, Civil Service (Medical): No    Lack of Transportation (Non-Medical): No  Physical Activity: Sufficiently Active (12/25/2022)   Exercise Vital Sign    Days of Exercise per Week: 5 days    Minutes of Exercise per Session: 30 min  Stress: No Stress Concern Present (12/25/2022)   [employer] of Occupational Health - Occupational Stress Questionnaire    Feeling of Stress : Not at all  Social Connections: Moderately Integrated (12/25/2022)   Social Connection and Isolation Panel [NHANES]    Frequency of Communication with Friends and Family: Three times a week    Frequency of Social Gatherings with Friends and Family: Three times a week    Attends Religious Services: More than 4 times per year    Active Member of Clubs or Organizations: No    Attends Banker Meetings: Never    Marital Status: Married    Tobacco Counseling Ready to quit: Not Answered Counseling given: Not Answered Tobacco comments: 1 ppd for > 30 years.   Clinical Intake:  Pre-visit  preparation completed: Yes Pain : No/denies pain Pain Score: 0-No pain   BMI - recorded: 25.77 Nutritional Status: BMI 25 -29 Overweight Nutritional Risks: None Diabetes: Yes CBG done?: No (reported 125 taken from home this morning) Did pt. bring in CBG monitor from home?: No How often do you need to have someone help you when you read instructions, pamphlets, or other written materials from your doctor or pharmacy?: 1 - Never Interpreter Needed?: No    Activities of Daily Living    12/25/2022    9:36 AM 06/21/2022    7:57 AM  In your present state of health, do you have any difficulty performing the following activities:  Hearing? 0 0  Vision? 0 0  Difficulty concentrating or making decisions?  0  Walking or climbing stairs? 0 0  Dressing or bathing? 0 0  Doing errands, shopping? 0 0  Preparing Food and eating ? N   Using the Toilet? N   In the past six months, have you accidently leaked urine? N   Do you have problems with loss of bowel control? N   Managing your Medications? N   Managing your Finances? N   Housekeeping or managing your Housekeeping? N     Patient Care Team: Blane Ohara, MD as PCP - General (Family Medicine)     Assessment:   This is a routine wellness examination for [place].  Hearing/Vision screen No results found.  Depression Screen    12/25/2022    9:32 AM 03/15/2022    8:03 AM 12/21/2021    8:02 AM 05/29/2021    8:18 AM 12/16/2020    8:52 AM 04/04/2020    8:27 AM 09/09/2019    8:23 AM  PHQ 2/9 Scores  PHQ - 2 Score 0 0 0 0 0 0 0  PHQ- 9 Score     0      Fall Risk    12/25/2022    9:01 AM 06/21/2022    8:01 AM 03/15/2022    8:03 AM 12/21/2021    8:02 AM 12/16/2020    8:53 AM  Fall Risk   Falls in the past year? 0 0 0 0 0  Number falls in past yr: 0 0 0 0 0  Injury with Fall? 0 0 0 0 0  Risk for fall due to : No Fall Risks No Fall Risks No Fall Risks No Fall Risks No Fall Risks  Follow up Falls evaluation completed;Education provided Falls  evaluation completed Falls evaluation completed Falls evaluation completed;Falls prevention discussed Falls evaluation completed    MEDICARE RISK AT HOME: Medicare Risk at Home Any stairs in or around the home?: Yes If so, are there any without handrails?: No Home free of loose throw rugs in walkways, pet beds, electrical cords, etc?: Yes Adequate lighting in your home to reduce risk of falls?: Yes Life alert?: No Use of a cane, walker or w/c?: No Grab bars in the bathroom?: No Shower chair or bench in shower?: No Elevated toilet seat or a handicapped toilet?: No  TIMED UP AND GO:  Was the test performed?  No    Cognitive Function:        12/25/2022    9:36 AM 12/21/2021    8:03 AM 12/16/2020    8:53 AM 09/09/2019    8:26 AM  6CIT Screen  What Year? 0 points 0 points 0 points 0 points  What month? 0 points 0 points 0 points 0 points  What time? 0 points 0 points 0 points 0 points  Count back from 20 0 points 0 points 0 points 0 points  Months in reverse 0 points 0 points 0 points 0 points  Repeat phrase 0 points 0 points 2 points 0 points  Total Score 0 points 0 points 2 points 0 points    Immunizations Immunization History  Administered Date(s) Administered   Fluad Quad(high Dose 65+) 01/19/2020, 02/21/2021, 03/15/2022   PFIZER(Purple Top)SARS-COV-2 Vaccination 06/26/2019, 07/17/2019, 01/27/2020   Pfizer Covid-19 Vaccine Bivalent Booster 50yrs & up 05/29/2021   Pneumococcal Conjugate-13 03/19/2018   Pneumococcal Polysaccharide-23 02/21/2017    TDAP status: Due, Education has been provided regarding the importance of this vaccine. Advised may receive this  vaccine at local pharmacy or Health Dept. Aware to provide a copy of the vaccination record if obtained from local pharmacy or Health Dept. Verbalized acceptance and understanding.  Flu Vaccine status: Due, Education has been provided regarding the importance of this vaccine. Advised may receive this vaccine at local  pharmacy or Health Dept. Aware to provide a copy of the vaccination record if obtained from local pharmacy or Health Dept. Verbalized acceptance and understanding.  Pneumococcal vaccine status: Due, Education has been provided regarding the importance of this vaccine. Advised may receive this vaccine at local pharmacy or Health Dept. Aware to provide a copy of the vaccination record if obtained from local pharmacy or Health Dept. Verbalized acceptance and understanding.  Covid-19 vaccine status: Information provided on how to obtain vaccines.   Qualifies for Shingles Vaccine? Yes   Zostavax completed No   Shingrix Completed?: No.    Education has been provided regarding the importance of this vaccine. Patient has been advised to call insurance company to determine out of pocket expense if they have not yet received this vaccine. Advised may also receive vaccine at local pharmacy or Health Dept. Verbalized acceptance and understanding.  Screening Tests Health Maintenance  Topic Date Due   DTaP/Tdap/Td (1 - Tdap) Never done   Fecal DNA (Cologuard)  Never done   Zoster Vaccines- Shingrix (1 of 2) Never done   Pneumonia Vaccine 49+ Years old (3 of 3 - PPSV23 or PCV20) 02/21/2022   INFLUENZA VACCINE  11/14/2022   COVID-19 Vaccine (5 - 2023-24 season) 12/15/2022   Medicare Annual Wellness (AWV)  12/20/2022   HEMOGLOBIN A1C  05/04/2023   OPHTHALMOLOGY EXAM  06/11/2023   Diabetic kidney evaluation - Urine ACR  06/21/2023   Lung Cancer Screening  08/16/2023   Diabetic kidney evaluation - eGFR measurement  11/01/2023   FOOT EXAM  11/01/2023   Hepatitis C Screening  Completed   HPV VACCINES  Aged Out    Health Maintenance  Health Maintenance Due  Topic Date Due   DTaP/Tdap/Td (1 - Tdap) Never done   Fecal DNA (Cologuard)  Never done   Zoster Vaccines- Shingrix (1 of 2) Never done   Pneumonia Vaccine 48+ Years old (3 of 3 - PPSV23 or PCV20) 02/21/2022   INFLUENZA VACCINE  11/14/2022    COVID-19 Vaccine (5 - 2023-24 season) 12/15/2022   Medicare Annual Wellness (AWV)  12/20/2022    Lung Cancer Screening: (Low Dose CT Chest recommended if Age 4-80 years, 20 pack-year currently smoking OR have quit w/in 15years.) does qualify.   Lung Cancer Screening Referral: completed  Additional Screening:  Vision Screening: Recommended annual ophthalmology exams for early detection of glaucoma and other disorders of the eye. Is the patient up to date with their annual eye exam?  Yes   Dental Screening: Recommended annual dental exams for proper oral hygiene  Community Resource Referral / Chronic Care Management: CRR required this visit?  No   CCM required this visit?  No     Plan:    1- Vaccines Due: Flu, COVID, PNA, Tetanus, Shingles 2- Cologuard Due - this has been order multiple times and patient has received however he has not done it 3- Lung Cancer Screen - done 08/21/22   I have personally reviewed and noted the following in the patient's chart:   Medical and social history Use of alcohol, tobacco or illicit drugs  Current medications and supplements including opioid prescriptions.  Functional ability and status Nutritional status Physical activity Advanced  directives List of other physicians Hospitalizations, surgeries, and ER visits in previous 12 months Vitals Screenings to include cognitive, depression, and falls Referrals and appointments  In addition, I have reviewed and discussed with patient certain preventive protocols, quality metrics, and best practice recommendations. A written personalized care plan for preventive services as well as general preventive health recommendations were provided to patient.     Jacklynn Bue, LPN   [id number]   After Visit Summary: (Mail) Due to this being a telephonic visit, the after visit summary with patients personalized plan was offered to patient via mail

## 2022-12-25 NOTE — Patient Instructions (Signed)
Mr. Nathaniel Nicholson , Thank you for taking time to come for your Medicare Wellness Visit. I appreciate your ongoing commitment to your health goals. Please review the following plan we discussed and let me know if I can assist you in the future.    This is a list of the screening recommended for you and due dates:  Health Maintenance  Topic Date Due   DTaP/Tdap/Td vaccine (1 - Tdap) Never done   Cologuard (Stool DNA test)  Never done   Zoster (Shingles) Vaccine (1 of 2) Never done   Pneumonia Vaccine (3 of 3 - PPSV23 or PCV20) 02/21/2022   Flu Shot  11/14/2022   COVID-19 Vaccine (5 - 2023-24 season) 12/15/2022   Hemoglobin A1C  05/04/2023   Eye exam for diabetics  06/11/2023   Yearly kidney health urinalysis for diabetes  06/21/2023   Screening for Lung Cancer  08/16/2023   Yearly kidney function blood test for diabetes  11/01/2023   Complete foot exam   11/01/2023   Medicare Annual Wellness Visit  12/25/2023   Hepatitis C Screening  Completed   HPV Vaccine  Aged Out   You can get the following vaccines at the office: FLU, COVID, PNEUMONIA  You can get the following vaccines at the pharmacy: SHINGLES, TETANUS  Preventive Care 65 Years and Older, Male  Preventive care refers to lifestyle choices and visits with your health care provider that can promote health and wellness. What does preventive care include? A yearly physical exam. This is also called an annual well check. Dental exams once or twice a year. Routine eye exams. Ask your health care provider how often you should have your eyes checked. Personal lifestyle choices, including: Daily care of your teeth and gums. Regular physical activity. Eating a healthy diet. Avoiding tobacco and drug use. Limiting alcohol use. Practicing safe sex. Taking low doses of aspirin every day. Taking vitamin and mineral supplements as recommended by your health care provider. What happens during an annual well check? The services and screenings  done by your health care provider during your annual well check will depend on your age, overall health, lifestyle risk factors, and family history of disease. Counseling  Your health care provider may ask you questions about your: Alcohol use. Tobacco use. Drug use. Emotional well-being. Home and relationship well-being. Sexual activity. Eating habits. History of falls. Memory and ability to understand (cognition). Work and work Astronomer. Screening  You may have the following tests or measurements: Height, weight, and BMI. Blood pressure. Lipid and cholesterol levels. These may be checked every 5 years, or more frequently if you are over 32 years old. Skin check. Lung cancer screening. You may have this screening every year starting at age 57 if you have a 30-pack-year history of smoking and currently smoke or have quit within the past 15 years. Fecal occult blood test (FOBT) of the stool. You may have this test every year starting at age 38. Flexible sigmoidoscopy or colonoscopy. You may have a sigmoidoscopy every 5 years or a colonoscopy every 10 years starting at age 50. Prostate cancer screening. Recommendations will vary depending on your family history and other risks. Hepatitis C blood test. Hepatitis B blood test. Sexually transmitted disease (STD) testing. Diabetes screening. This is done by checking your blood sugar (glucose) after you have not eaten for a while (fasting). You may have this done every 1-3 years. Abdominal aortic aneurysm (AAA) screening. You may need this if you are a current or former smoker.  Osteoporosis. You may be screened starting at age 13 if you are at high risk. Talk with your health care provider about your test results, treatment options, and if necessary, the need for more tests. Vaccines  Your health care provider may recommend certain vaccines, such as: Influenza vaccine. This is recommended every year. Tetanus, diphtheria, and acellular  pertussis (Tdap, Td) vaccine. You may need a Td booster every 10 years. Zoster vaccine. You may need this after age 11. Pneumococcal 13-valent conjugate (PCV13) vaccine. One dose is recommended after age 74. Pneumococcal polysaccharide (PPSV23) vaccine. One dose is recommended after age 47. Talk to your health care provider about which screenings and vaccines you need and how often you need them. This information is not intended to replace advice given to you by your health care provider. Make sure you discuss any questions you have with your health care provider. Document Released: 04/28/2015 Document Revised: 12/20/2015 Document Reviewed: 01/31/2015 Elsevier Interactive Patient Education  2017 [employer].  Fall Prevention in the Home Falls can cause injuries. They can happen to people of all ages. There are many things you can do to make your home safe and to help prevent falls. What can I do on the outside of my home? Regularly fix the edges of walkways and driveways and fix any cracks. Remove anything that might make you trip as you walk through a door, such as a raised step or threshold. Trim any bushes or trees on the path to your home. Use bright outdoor lighting. Clear any walking paths of anything that might make someone trip, such as rocks or tools. Regularly check to see if handrails are loose or broken. Make sure that both sides of any steps have handrails. Any raised decks and porches should have guardrails on the edges. Have any leaves, snow, or ice cleared regularly. Use sand or salt on walking paths during winter. Clean up any spills in your garage right away. This includes oil or grease spills. What can I do in the bathroom? Use night lights. Install grab bars by the toilet and in the tub and shower. Do not use towel bars as grab bars. Use non-skid mats or decals in the tub or shower. If you need to sit down in the shower, use a plastic, non-slip stool. Keep the floor  dry. Clean up any water that spills on the floor as soon as it happens. Remove soap buildup in the tub or shower regularly. Attach bath mats securely with double-sided non-slip rug tape. Do not have throw rugs and other things on the floor that can make you trip. What can I do in the bedroom? Use night lights. Make sure that you have a light by your bed that is easy to reach. Do not use any sheets or blankets that are too big for your bed. They should not hang down onto the floor. Have a firm chair that has side arms. You can use this for support while you get dressed. Do not have throw rugs and other things on the floor that can make you trip. What can I do in the kitchen? Clean up any spills right away. Avoid walking on wet floors. Keep items that you use a lot in easy-to-reach places. If you need to reach something above you, use a strong step stool that has a grab bar. Keep electrical cords out of the way. Do not use floor polish or wax that makes floors slippery. If you must use wax, use non-skid floor  wax. Do not have throw rugs and other things on the floor that can make you trip. What can I do with my stairs? Do not leave any items on the stairs. Make sure that there are handrails on both sides of the stairs and use them. Fix handrails that are broken or loose. Make sure that handrails are as long as the stairways. Check any carpeting to make sure that it is firmly attached to the stairs. Fix any carpet that is loose or worn. Avoid having throw rugs at the top or bottom of the stairs. If you do have throw rugs, attach them to the floor with carpet tape. Make sure that you have a light switch at the top of the stairs and the bottom of the stairs. If you do not have them, ask someone to add them for you. What else can I do to help prevent falls? Wear shoes that: Do not have high heels. Have rubber bottoms. Are comfortable and fit you well. Are closed at the toe. Do not wear  sandals. If you use a stepladder: Make sure that it is fully opened. Do not climb a closed stepladder. Make sure that both sides of the stepladder are locked into place. Ask someone to hold it for you, if possible. Clearly mark and make sure that you can see: Any grab bars or handrails. First and last steps. Where the edge of each step is. Use tools that help you move around (mobility aids) if they are needed. These include: Canes. Walkers. Scooters. Crutches. Turn on the lights when you go into a dark area. Replace any light bulbs as soon as they burn out. Set up your furniture so you have a clear path. Avoid moving your furniture around. If any of your floors are uneven, fix them. If there are any pets around you, be aware of where they are. Review your medicines with your doctor. Some medicines can make you feel dizzy. This can increase your chance of falling. Ask your doctor what other things that you can do to help prevent falls. This information is not intended to replace advice given to you by your health care provider. Make sure you discuss any questions you have with your health care provider. Document Released: 01/26/2009 Document Revised: 09/07/2015 Document Reviewed: 05/06/2014 Elsevier Interactive Patient Education  2017 [employer].

## 2022-12-31 ENCOUNTER — Other Ambulatory Visit: Payer: Self-pay

## 2022-12-31 DIAGNOSIS — E1142 Type 2 diabetes mellitus with diabetic polyneuropathy: Secondary | ICD-10-CM

## 2022-12-31 MED ORDER — FARXIGA 10 MG PO TABS: 10.0000 mg | ORAL_TABLET | Freq: Every day | ORAL | 2 refills | Status: AC

## 2022-12-31 MED ORDER — METFORMIN HCL 1000 MG PO TABS
1000.0000 mg | ORAL_TABLET | Freq: Two times a day (BID) | ORAL | 0 refills | Status: DC
Start: 2022-12-31 — End: 2023-03-31

## 2023-01-01 ENCOUNTER — Other Ambulatory Visit: Payer: Self-pay | Admitting: Family Medicine

## 2023-01-01 DIAGNOSIS — M1A0610 Idiopathic chronic gout, right knee, without tophus (tophi): Secondary | ICD-10-CM

## 2023-02-16 ENCOUNTER — Other Ambulatory Visit: Payer: Self-pay | Admitting: Family Medicine

## 2023-02-16 DIAGNOSIS — E1142 Type 2 diabetes mellitus with diabetic polyneuropathy: Secondary | ICD-10-CM

## 2023-02-16 DIAGNOSIS — I152 Hypertension secondary to endocrine disorders: Secondary | ICD-10-CM

## 2023-02-17 NOTE — Assessment & Plan Note (Signed)
The current medical regimen is effective;  continue present plan and medications. Continue testosterone shots every 2 weeks.  

## 2023-02-17 NOTE — Progress Notes (Signed)
Subjective:  Patient ID: Nathaniel Bunde Sr., male    DOB: February 25, 1951  Age: 72 y.o. MRN: [id number]  No chief complaint on file.   HPI   Diabetes:  Complications: neuropathy Glucose checking: daily Glucose logs: 110-140 Hypoglycemia: no  Most recent A1C: 7.1 Current medications: metformin 1000 mg one twice daily, farxiga 10 mg daily, Actos 15 mg daily Last Eye Exam: 06/10/2022. Glaucoma: eye drops. Foot checks: daily   Hyperlipidemia: Current medications: lipitor 20 mg daily.    Hypertension: Current medications:  Losartan 100 mg daily    GERD: nexium 40 mg daily.   Hypogonadism: Has not been taking testosterone. Diet: healthy. Exercise: walk a lot at work.      12/25/2022    9:32 AM 03/15/2022    8:03 AM 12/21/2021    8:02 AM 05/29/2021    8:18 AM 12/16/2020    8:52 AM  Depression screen PHQ 2/9  Decreased Interest 0 0 0 0 0  Down, Depressed, Hopeless 0 0 0 0 0  PHQ - 2 Score 0 0 0 0 0  Altered sleeping     0  Tired, decreased energy     0  Change in appetite     0  Feeling bad or failure about yourself      0  Trouble concentrating     0  Moving slowly or fidgety/restless     0  Suicidal thoughts     0  PHQ-9 Score     0        12/25/2022    9:01 AM  Fall Risk   Falls in the past year? 0  Number falls in past yr: 0  Injury with Fall? 0  Risk for fall due to : No Fall Risks  Follow up Falls evaluation completed;Education provided    Patient Care Team: CoxFritzi Mandes, MD as PCP - General (Family Medicine)   Review of Systems  Current Outpatient Medications on File Prior to Visit  Medication Sig Dispense Refill   allopurinol (ZYLOPRIM) 100 MG tablet Take 1 tablet by mouth once daily 90 tablet 0   atorvastatin (LIPITOR) 20 MG tablet Take 1 tablet by mouth once daily 90 tablet 0   colchicine 0.6 MG tablet Take 1 tablet (0.6 mg total) by mouth 2 (two) times daily. 14 tablet 1   esomeprazole (NEXIUM) 40 MG capsule Take 40 mg by mouth daily at 12 noon.      FARXIGA 10 MG TABS tablet Take 1 tablet (10 mg total) by mouth daily before breakfast. 30 tablet 2   gabapentin (NEURONTIN) 300 MG capsule Take 1 capsule by mouth twice daily 180 capsule 0   losartan (COZAAR) 100 MG tablet Take 1 tablet by mouth once daily 90 tablet 0   metFORMIN (GLUCOPHAGE) 1000 MG tablet Take 1 tablet (1,000 mg total) by mouth 2 (two) times daily with a meal. 180 tablet 0   pioglitazone (ACTOS) 30 MG tablet Take 1 tablet (30 mg total) by mouth daily. 90 tablet 0   tadalafil (CIALIS) 10 MG tablet Take 1 tablet (10 mg total) by mouth daily. 30 tablet 3   testosterone cypionate (DEPOTESTOSTERONE CYPIONATE) 200 MG/ML injection Inject 1 mL (200 mg total) into the muscle every 14 (fourteen) days. 2 mL 5   timolol (TIMOPTIC) 0.25 % ophthalmic solution 1 drop 2 (two) times daily.     No current facility-administered medications on file prior to visit.   Past Medical History:  Diagnosis Date   Diabetes  mellitus without complication (HCC)    GERD (gastroesophageal reflux disease)    Hyperlipemia    Hypertension    Nicotine dependence, cigarettes, uncomplicated    Onychomycosis of great toe 02/21/2021   Other male erectile dysfunction    Peptic ulcer 2000   Testicular hypofunction    Vitamin D deficiency    Past Surgical History:  Procedure Laterality Date   CATARACT EXTRACTION Left 2017    Family History  Problem Relation Age of Onset   Cancer Father        lung   Cancer Sister        lung   Cancer Brother        throat   Hypertension Other    Diabetes Other    Social History   Socioeconomic History   Marital status: Married    Spouse name: [name]   Number of children: Not on file   Years of education: Not on file   Highest education level: Not on file  Occupational History   Occupation: Marine scientist  Tobacco Use   Smoking status: Every Day    Current packs/day: 0.50    Average packs/day: 0.5 packs/day for 50.0 years (25.0 ttl pk-yrs)    Types:  Cigarettes   Smokeless tobacco: Never   Tobacco comments:    1 ppd for > 30 years.  Vaping Use   Vaping status: Never Used  Substance and Sexual Activity   Alcohol use: Yes    Alcohol/week: 28.0 standard drinks of alcohol    Types: 28 Cans of beer per week    Comment: 4/daily   Drug use: No   Sexual activity: Not on file  Other Topics Concern   Not on file  Social History Narrative   Not on file   Social Determinants of Health   Financial Resource Strain: Low Risk  (12/25/2022)   Overall Financial Resource Strain (CARDIA)    Difficulty of Paying Living Expenses: Not very hard  Food Insecurity: No Food Insecurity (12/25/2022)   Hunger Vital Sign    Worried About Running Out of Food in the Last Year: Never true    Ran Out of Food in the Last Year: Never true  Transportation Needs: No Transportation Needs (12/25/2022)   PRAPARE - Administrator, Civil Service (Medical): No    Lack of Transportation (Non-Medical): No  Physical Activity: Sufficiently Active (12/25/2022)   Exercise Vital Sign    Days of Exercise per Week: 5 days    Minutes of Exercise per Session: 30 min  Stress: No Stress Concern Present (12/25/2022)   [employer] of Occupational Health - Occupational Stress Questionnaire    Feeling of Stress : Not at all  Social Connections: Moderately Integrated (12/25/2022)   Social Connection and Isolation Panel [NHANES]    Frequency of Communication with Friends and Family: Three times a week    Frequency of Social Gatherings with Friends and Family: Three times a week    Attends Religious Services: More than 4 times per year    Active Member of Clubs or Organizations: No    Attends Banker Meetings: Never    Marital Status: Married    Objective:  There were no vitals taken for this visit.     10/31/2022    1:40 PM 06/21/2022    7:56 AM 03/15/2022    7:57 AM  BP/Weight  Systolic BP 120 136 134  Diastolic BP 80 68 82  Wt. (Lbs) 206.2 209  212.4  BMI 25.77 kg/m2 26.12 kg/m2 27.27 kg/m2    Physical Exam  Diabetic Foot Exam - Simple   No data filed      Lab Results  Component Value Date   WBC 3.1 (L) 11/01/2022   HGB 13.9 11/01/2022   HCT 41.8 11/01/2022   PLT 209 11/01/2022   GLUCOSE 146 (H) 11/01/2022   CHOL 202 (H) 11/01/2022   TRIG 149 11/01/2022   HDL 46 11/01/2022   LDLCALC 129 (H) 11/01/2022   ALT 22 11/01/2022   AST 16 11/01/2022   NA 141 11/01/2022   K 5.1 11/01/2022   CL 103 11/01/2022   CREATININE 1.16 11/01/2022   BUN 19 11/01/2022   CO2 25 11/01/2022   TSH 1.280 06/21/2022   HGBA1C 7.1 (H) 11/01/2022   MICROALBUR 0 02/21/2021      Assessment & Plan:    Hypertension associated with diabetes (HCC) Assessment & Plan: Well controlled.  No changes to medicines. Continue losartan 100 mg daily.  Continue to work on eating a healthy diet and exercise.  Labs drawn today.     Diabetic polyneuropathy associated with type 2 diabetes mellitus (HCC) Assessment & Plan: Control: not at goal Recommend check sugars fasting daily. Recommend check feet daily. Recommend annual eye exams. Medicines: Continue metformin 1000 mg one twice daily, Increase farxiga 10 mg daily, gabapentin 300 mg twice daily, and actos 15 mg daily. Continue to work on eating a healthy diet and exercise.  Labs drawn today.      Mixed hyperlipidemia Assessment & Plan: Well controlled.  No changes to medicines. Continue lipitor 20 mg daily.  Continue to work on eating a healthy diet and exercise.  Labs drawn today.     Hypogonadism in male Assessment & Plan: The current medical regimen is effective;  continue present plan and medications. Continue testosterone shots every 2 weeks.       No orders of the defined types were placed in this encounter.   No orders of the defined types were placed in this encounter.    Follow-up: No follow-ups on file.   I,Vayda Dungee I Leal-Borjas,acting as a scribe for Blane Ohara,  MD.,have documented all relevant documentation on the behalf of Blane Ohara, MD,as directed by  Blane Ohara, MD while in the presence of Blane Ohara, MD.   An After Visit Summary was printed and given to the patient.  Blane Ohara, MD [facility] [phone]

## 2023-02-17 NOTE — Assessment & Plan Note (Signed)
Well controlled.  No changes to medicines. Continue lipitor 20 mg daily.  Continue to work on eating a healthy diet and exercise.  Labs drawn today.   

## 2023-02-17 NOTE — Assessment & Plan Note (Signed)
Well controlled.  No changes to medicines. Continue losartan 100 mg daily.  Continue to work on eating a healthy diet and exercise.  Labs drawn today.  

## 2023-02-17 NOTE — Assessment & Plan Note (Signed)
Control: not at goal Recommend check sugars fasting daily. Recommend check feet daily. Recommend annual eye exams. Medicines: Continue metformin 1000 mg one twice daily, Increase farxiga 10 mg daily, gabapentin 300 mg twice daily, and actos 15 mg daily. Continue to work on eating a healthy diet and exercise.  Labs drawn today.

## 2023-02-18 ENCOUNTER — Encounter (INDEPENDENT_AMBULATORY_CARE_PROVIDER_SITE_OTHER): Payer: Medicare HMO | Admitting: Family Medicine

## 2023-02-18 DIAGNOSIS — E1159 Type 2 diabetes mellitus with other circulatory complications: Secondary | ICD-10-CM

## 2023-02-18 DIAGNOSIS — I152 Hypertension secondary to endocrine disorders: Secondary | ICD-10-CM | POA: Diagnosis not present

## 2023-02-18 DIAGNOSIS — E291 Testicular hypofunction: Secondary | ICD-10-CM

## 2023-02-18 DIAGNOSIS — E782 Mixed hyperlipidemia: Secondary | ICD-10-CM

## 2023-02-18 DIAGNOSIS — E1142 Type 2 diabetes mellitus with diabetic polyneuropathy: Secondary | ICD-10-CM

## 2023-02-24 ENCOUNTER — Other Ambulatory Visit: Payer: Self-pay | Admitting: Family Medicine

## 2023-02-24 DIAGNOSIS — E1142 Type 2 diabetes mellitus with diabetic polyneuropathy: Secondary | ICD-10-CM

## 2023-03-03 DIAGNOSIS — H40013 Open angle with borderline findings, low risk, bilateral: Secondary | ICD-10-CM | POA: Diagnosis not present

## 2023-03-06 ENCOUNTER — Other Ambulatory Visit: Payer: Self-pay | Admitting: Family Medicine

## 2023-03-06 DIAGNOSIS — E782 Mixed hyperlipidemia: Secondary | ICD-10-CM

## 2023-03-10 DIAGNOSIS — B351 Tinea unguium: Secondary | ICD-10-CM | POA: Diagnosis not present

## 2023-03-10 DIAGNOSIS — Z7984 Long term (current) use of oral hypoglycemic drugs: Secondary | ICD-10-CM | POA: Diagnosis not present

## 2023-03-10 DIAGNOSIS — E1142 Type 2 diabetes mellitus with diabetic polyneuropathy: Secondary | ICD-10-CM | POA: Diagnosis not present

## 2023-03-19 NOTE — Progress Notes (Signed)
Subjective:  Patient ID: Nathaniel Bunde Sr., male    DOB: 02-22-1951  Age: 72 y.o. MRN: [id number]  Chief Complaint  Patient presents with   Medical Management of Chronic Issues   Neck Pain    Right neck pain tender    HPI   Diabetes:  Complications: neuropathy Glucose checking: daily Glucose logs: 110-161 Hypoglycemia: no  Most recent A1C: 7.1 Current medications: metformin 1000 mg one twice daily, farxiga 10 mg daily, Actos 15 mg daily Last Eye Exam: 02/2023. Glaucoma: eye drops. Foot checks: daily   Hyperlipidemia: Current medications: lipitor 20 mg daily.    Hypertension: Current medications:  Losartan 100 mg daily    GERD: nexium 40 mg daily.   Hypogonadism: Has not been taking testosterone. Diet: healthy.  Exercise: walk a lot at work.   Patient has right neck tenderness since 2 months ago when he wakes up. The neck pain is located on the right side and extends to the right shoulder. It is worse in the morning and improves during the day. The patient has not taken any medication for the neck pain and believes it may be due to sleeping in an incorrect position.  The patient also reports stomach pain, which has been present for about two months. The pain is not related to food intake and can occur at any time. The patient has a history of a stomach ulcer and is currently taking esomeprazole for stomach issues. The patient has not been nauseated or vomited, and there is no blood in the stool. The patient also reports a burning sensation in the belly, which is not related to food intake.  The patient also reports erectile dysfunction. The patient has tried testosterone shots in the past, but he did not seem to work. The patient expresses interest in seeing a urologist for this issue.     03/20/2023    8:15 AM 12/25/2022    9:32 AM 03/15/2022    8:03 AM 12/21/2021    8:02 AM 05/29/2021    8:18 AM  Depression screen PHQ 2/9  Decreased Interest 0 0 0 0 0  Down,  Depressed, Hopeless 0 0 0 0 0  PHQ - 2 Score 0 0 0 0 0  Altered sleeping 0      Tired, decreased energy 0      Change in appetite 0      Feeling bad or failure about yourself  0      Trouble concentrating 0      Moving slowly or fidgety/restless 0      Suicidal thoughts 0      PHQ-9 Score 0      Difficult doing work/chores Not difficult at all            03/20/2023    8:15 AM  Fall Risk   Falls in the past year? 0  Number falls in past yr: 0  Injury with Fall? 0  Risk for fall due to : No Fall Risks  Follow up Education provided    Patient Care Team: Blane Ohara, MD as PCP - General (Family Medicine)   Review of Systems  Constitutional:  Negative for chills, fatigue, fever and unexpected weight change.  HENT:  Negative for congestion, ear pain, sinus pain and sore throat.   Respiratory:  Negative for cough.   Cardiovascular:  Negative for chest pain and palpitations.  Gastrointestinal:  Positive for abdominal pain. Negative for blood in stool, constipation, diarrhea, nausea and vomiting.  Endocrine:  Negative for polydipsia.  Genitourinary:  Negative for dysuria.  Musculoskeletal:  Positive for neck pain. Negative for back pain.  Skin:  Negative for rash.  Neurological:  Negative for headaches.    Current Outpatient Medications on File Prior to Visit  Medication Sig Dispense Refill   allopurinol (ZYLOPRIM) 100 MG tablet Take 1 tablet by mouth once daily 90 tablet 0   atorvastatin (LIPITOR) 20 MG tablet Take 1 tablet by mouth once daily 90 tablet 0   colchicine 0.6 MG tablet Take 1 tablet (0.6 mg total) by mouth 2 (two) times daily. 14 tablet 1   FARXIGA 10 MG TABS tablet Take 1 tablet (10 mg total) by mouth daily before breakfast. 30 tablet 2   gabapentin (NEURONTIN) 300 MG capsule Take 1 capsule by mouth twice daily 180 capsule 0   losartan (COZAAR) 100 MG tablet Take 1 tablet by mouth once daily 90 tablet 0   metFORMIN (GLUCOPHAGE) 1000 MG tablet Take 1 tablet (1,000  mg total) by mouth 2 (two) times daily with a meal. 180 tablet 0   pioglitazone (ACTOS) 30 MG tablet Take 1 tablet (30 mg total) by mouth daily. 90 tablet 0   tadalafil (CIALIS) 10 MG tablet Take 1 tablet (10 mg total) by mouth daily. 30 tablet 3   timolol (TIMOPTIC) 0.25 % ophthalmic solution 1 drop 2 (two) times daily.     testosterone cypionate (DEPOTESTOSTERONE CYPIONATE) 200 MG/ML injection Inject 1 mL (200 mg total) into the muscle every 14 (fourteen) days. (Patient not taking: Reported on 03/20/2023) 2 mL 5   No current facility-administered medications on file prior to visit.   Past Medical History:  Diagnosis Date   Diabetes mellitus without complication (HCC)    GERD (gastroesophageal reflux disease)    Hyperlipemia    Hypertension    Nicotine dependence, cigarettes, uncomplicated    Onychomycosis of great toe 02/21/2021   Other male erectile dysfunction    Peptic ulcer 2000   Testicular hypofunction    Vitamin D deficiency    Past Surgical History:  Procedure Laterality Date   CATARACT EXTRACTION Left 2017    Family History  Problem Relation Age of Onset   Cancer Father        lung   Cancer Sister        lung   Cancer Brother        throat   Hypertension Other    Diabetes Other    Social History   Socioeconomic History   Marital status: Married    Spouse name: [name]   Number of children: Not on file   Years of education: Not on file   Highest education level: Not on file  Occupational History   Occupation: Marine scientist  Tobacco Use   Smoking status: Every Day    Current packs/day: 0.50    Average packs/day: 0.5 packs/day for 50.0 years (25.0 ttl pk-yrs)    Types: Cigarettes   Smokeless tobacco: Never   Tobacco comments:    1 ppd for > 30 years.  Vaping Use   Vaping status: Never Used  Substance and Sexual Activity   Alcohol use: Yes    Alcohol/week: 28.0 standard drinks of alcohol    Types: 28 Cans of beer per week    Comment: 4/daily    Drug use: No   Sexual activity: Not on file  Other Topics Concern   Not on file  Social History Narrative   Not on file   Social Determinants  of Health   Financial Resource Strain: Low Risk  (12/25/2022)   Overall Financial Resource Strain (CARDIA)    Difficulty of Paying Living Expenses: Not very hard  Food Insecurity: No Food Insecurity (12/25/2022)   Hunger Vital Sign    Worried About Running Out of Food in the Last Year: Never true    Ran Out of Food in the Last Year: Never true  Transportation Needs: No Transportation Needs (12/25/2022)   PRAPARE - Administrator, Civil Service (Medical): No    Lack of Transportation (Non-Medical): No  Physical Activity: Sufficiently Active (12/25/2022)   Exercise Vital Sign    Days of Exercise per Week: 5 days    Minutes of Exercise per Session: 30 min  Stress: No Stress Concern Present (12/25/2022)   [employer] of Occupational Health - Occupational Stress Questionnaire    Feeling of Stress : Not at all  Social Connections: Moderately Integrated (12/25/2022)   Social Connection and Isolation Panel [NHANES]    Frequency of Communication with Friends and Family: Three times a week    Frequency of Social Gatherings with Friends and Family: Three times a week    Attends Religious Services: More than 4 times per year    Active Member of Clubs or Organizations: No    Attends Banker Meetings: Never    Marital Status: Married    Objective:  BP 118/70   Pulse 92   Temp (!) 97.5 F (36.4 C)   Resp 14   Ht 6\' 3"  (1.905 m)   Wt 214 lb (97.1 kg)   SpO2 95%   BMI 26.75 kg/m      03/20/2023    8:09 AM 10/31/2022    1:40 PM 06/21/2022    7:56 AM  BP/Weight  Systolic BP 118 120 136  Diastolic BP 70 80 68  Wt. (Lbs) 214 206.2 209  BMI 26.75 kg/m2 25.77 kg/m2 26.12 kg/m2    Physical Exam Vitals reviewed.  Constitutional:      Appearance: Normal appearance.  Neck:     Vascular: No carotid bruit.   Cardiovascular:     Rate and Rhythm: Normal rate and regular rhythm.     Pulses: Normal pulses.     Heart sounds: Normal heart sounds.  Pulmonary:     Effort: Pulmonary effort is normal.     Breath sounds: Normal breath sounds. No wheezing, rhonchi or rales.  Abdominal:     General: Bowel sounds are normal.     Palpations: Abdomen is soft.     Tenderness: There is no abdominal tenderness.  Musculoskeletal:     Comments: RIGHT SHOULDER EXAM TENDER: none FROM NORMAL  LEFT SHOULDER EXAM TENDER: anteriorly FROM NORMAL.  Right side of neck: tender to palpation. Muscles tight.   Neurological:     Mental Status: He is alert.  Psychiatric:        Mood and Affect: Mood normal.        Behavior: Behavior normal.     Diabetic Foot Exam - Simple   Simple Foot Form Diabetic Foot exam was performed with the following findings: Yes 03/20/2023  8:53 AM  Visual Inspection No deformities, no ulcerations, no other skin breakdown bilaterally: Yes Sensation Testing See comments: Yes Pulse Check Posterior Tibialis and Dorsalis pulse intact bilaterally: Yes Comments Shiny skin. No hair.       Lab Results  Component Value Date   WBC 3.6 03/20/2023   HGB 14.1 03/20/2023   HCT 43.7  03/20/2023   PLT 187 03/20/2023   GLUCOSE 167 (H) 03/20/2023   CHOL 120 03/20/2023   TRIG 156 (H) 03/20/2023   HDL 46 03/20/2023   LDLCALC 48 03/20/2023   ALT 28 03/20/2023   AST 23 03/20/2023   NA 141 03/20/2023   K 5.5 (H) 03/20/2023   CL 102 03/20/2023   CREATININE 1.27 03/20/2023   BUN 18 03/20/2023   CO2 24 03/20/2023   TSH 1.280 06/21/2022   HGBA1C 8.5 (H) 03/20/2023   MICROALBUR 0 02/21/2021      Assessment & Plan:    Hypertension associated with diabetes (HCC) Assessment & Plan: Well controlled.  No changes to medicines. Continue losartan 100 mg daily.  Continue to work on eating a healthy diet and exercise.  Labs drawn today.    Orders: -     CBC with Differential/Platelet -      Comprehensive metabolic panel  Diabetic polyneuropathy associated with type 2 diabetes mellitus (HCC) Assessment & Plan: Control: not at goal Recommend check sugars fasting daily. Recommend check feet daily. Recommend annual eye exams. Medicines: Continue metformin 1000 mg one twice daily, Increase farxiga 10 mg daily, gabapentin 300 mg twice daily, and actos 15 mg daily. Continue to work on eating a healthy diet and exercise.  Labs drawn today.     Orders: -     Hemoglobin A1c -     Microalbumin / creatinine urine ratio  Mixed hyperlipidemia Assessment & Plan: Well controlled.  No changes to medicines. Continue lipitor 20 mg daily.  Continue to work on eating a healthy diet and exercise.  Labs drawn today.    Orders: -     Lipid panel  Hypogonadism in male Assessment & Plan: History of erectile dysfunction, not responsive to testosterone therapy. Testosterone levels in the low normal range. -Refer to urology for further evaluation and management.   Other male erectile dysfunction Assessment & Plan: Referring to urology.  Orders: -     Ambulatory referral to Urology  Screen for colon cancer -     Cologuard  Neck pain Assessment & Plan: Sent Tizanidine.  Orders: -     tiZANidine HCl; Take 1 tablet (4 mg total) by mouth at bedtime.  Dispense: 30 tablet; Refill: 0  Chronic left shoulder pain Assessment & Plan: Sent Tizanidine.   Encounter for immunization -     Flu Vaccine Trivalent High Dose (Fluad) -     Pfizer Comirnaty Covid-19 Vaccine 16yrs & older  Gastroesophageal reflux disease without esophagitis Assessment & Plan: Increase nexium 40 mg twice daily.   Orders: -     Esomeprazole Magnesium; Take 1 capsule (40 mg total) by mouth 2 (two) times daily before a meal.  Dispense: 180 capsule; Refill: 0     Meds ordered this encounter  Medications   tiZANidine (ZANAFLEX) 4 MG tablet    Sig: Take 1 tablet (4 mg total) by mouth at bedtime.     Dispense:  30 tablet    Refill:  0   esomeprazole (NEXIUM) 40 MG capsule    Sig: Take 1 capsule (40 mg total) by mouth 2 (two) times daily before a meal.    Dispense:  180 capsule    Refill:  0    Orders Placed This Encounter  Procedures   Flu Vaccine Trivalent High Dose (Fluad)   Pfizer Comirnaty Covid-19 Vaccine 17yrs & older   CBC with Differential/Platelet   Comprehensive metabolic panel   Hemoglobin A1c   Lipid panel  Cologuard   Microalbumin / creatinine urine ratio   Ambulatory referral to Urology     Follow-up: No follow-ups on file.   I,Marla I Leal-Borjas,acting as a scribe for Blane Ohara, MD.,have documented all relevant documentation on the behalf of Blane Ohara, MD,as directed by  Blane Ohara, MD while in the presence of Blane Ohara, MD.   An After Visit Summary was printed and given to the patient.  Blane Ohara, MD [facility] [phone]

## 2023-03-20 ENCOUNTER — Ambulatory Visit (INDEPENDENT_AMBULATORY_CARE_PROVIDER_SITE_OTHER): Payer: Medicare HMO | Admitting: Family Medicine

## 2023-03-20 VITALS — BP 118/70 | HR 92 | Temp 97.5°F | Resp 14 | Ht 75.0 in | Wt 214.0 lb

## 2023-03-20 DIAGNOSIS — G8929 Other chronic pain: Secondary | ICD-10-CM

## 2023-03-20 DIAGNOSIS — M542 Cervicalgia: Secondary | ICD-10-CM

## 2023-03-20 DIAGNOSIS — Z23 Encounter for immunization: Secondary | ICD-10-CM | POA: Diagnosis not present

## 2023-03-20 DIAGNOSIS — E782 Mixed hyperlipidemia: Secondary | ICD-10-CM | POA: Diagnosis not present

## 2023-03-20 DIAGNOSIS — Z1211 Encounter for screening for malignant neoplasm of colon: Secondary | ICD-10-CM

## 2023-03-20 DIAGNOSIS — K219 Gastro-esophageal reflux disease without esophagitis: Secondary | ICD-10-CM

## 2023-03-20 DIAGNOSIS — M25512 Pain in left shoulder: Secondary | ICD-10-CM

## 2023-03-20 DIAGNOSIS — I152 Hypertension secondary to endocrine disorders: Secondary | ICD-10-CM | POA: Diagnosis not present

## 2023-03-20 DIAGNOSIS — E291 Testicular hypofunction: Secondary | ICD-10-CM

## 2023-03-20 DIAGNOSIS — N528 Other male erectile dysfunction: Secondary | ICD-10-CM | POA: Diagnosis not present

## 2023-03-20 DIAGNOSIS — E1142 Type 2 diabetes mellitus with diabetic polyneuropathy: Secondary | ICD-10-CM | POA: Diagnosis not present

## 2023-03-20 DIAGNOSIS — E1159 Type 2 diabetes mellitus with other circulatory complications: Secondary | ICD-10-CM

## 2023-03-20 MED ORDER — TIZANIDINE HCL 4 MG PO TABS: 4.0000 mg | ORAL_TABLET | Freq: Every day | ORAL | 0 refills | Status: DC

## 2023-03-20 MED ORDER — ESOMEPRAZOLE MAGNESIUM 40 MG PO CPDR: 40.0000 mg | DELAYED_RELEASE_CAPSULE | Freq: Two times a day (BID) | ORAL | 0 refills | Status: AC

## 2023-03-21 LAB — CBC WITH DIFFERENTIAL/PLATELET
Basophils Absolute: 0 10*3/uL (ref 0.0–0.2)
Basos: 0 %
EOS (ABSOLUTE): 0.1 10*3/uL (ref 0.0–0.4)
Eos: 3 %
Hematocrit: 43.7 % (ref 37.5–51.0)
Hemoglobin: 14.1 g/dL (ref 13.0–17.7)
Immature Grans (Abs): 0 10*3/uL (ref 0.0–0.1)
Immature Granulocytes: 1 %
Lymphocytes Absolute: 1.3 10*3/uL (ref 0.7–3.1)
Lymphs: 36 %
MCH: 32.4 pg (ref 26.6–33.0)
MCHC: 32.3 g/dL (ref 31.5–35.7)
MCV: 101 fL — ABNORMAL HIGH (ref 79–97)
Monocytes Absolute: 0.4 10*3/uL (ref 0.1–0.9)
Monocytes: 12 %
Neutrophils Absolute: 1.7 10*3/uL (ref 1.4–7.0)
Neutrophils: 48 %
Platelets: 187 10*3/uL (ref 150–450)
RBC: 4.35 x10E6/uL (ref 4.14–5.80)
RDW: 12 % (ref 11.6–15.4)
WBC: 3.6 10*3/uL (ref 3.4–10.8)

## 2023-03-21 LAB — COMPREHENSIVE METABOLIC PANEL
ALT: 28 [IU]/L (ref 0–44)
AST: 23 [IU]/L (ref 0–40)
Albumin: 4.7 g/dL (ref 3.8–4.8)
Alkaline Phosphatase: 99 [IU]/L (ref 44–121)
BUN/Creatinine Ratio: 14 (ref 10–24)
BUN: 18 mg/dL (ref 8–27)
Bilirubin Total: 0.5 mg/dL (ref 0.0–1.2)
CO2: 24 mmol/L (ref 20–29)
Calcium: 9.7 mg/dL (ref 8.6–10.2)
Chloride: 102 mmol/L (ref 96–106)
Creatinine, Ser: 1.27 mg/dL (ref 0.76–1.27)
Globulin, Total: 2.1 g/dL (ref 1.5–4.5)
Glucose: 167 mg/dL — ABNORMAL HIGH (ref 70–99)
Potassium: 5.5 mmol/L — ABNORMAL HIGH (ref 3.5–5.2)
Sodium: 141 mmol/L (ref 134–144)
Total Protein: 6.8 g/dL (ref 6.0–8.5)
eGFR: 60 mL/min/{1.73_m2} (ref >59)

## 2023-03-21 LAB — HEMOGLOBIN A1C
Est. average glucose Bld gHb Est-mCnc: 197 mg/dL
Hgb A1c MFr Bld: 8.5 % — ABNORMAL HIGH (ref 4.8–5.6)

## 2023-03-21 LAB — LIPID PANEL
Chol/HDL Ratio: 2.6 {ratio} (ref 0.0–5.0)
Cholesterol, Total: 120 mg/dL (ref 100–199)
HDL: 46 mg/dL (ref >39)
LDL Chol Calc (NIH): 48 mg/dL (ref 0–99)
Triglycerides: 156 mg/dL — ABNORMAL HIGH (ref 0–149)
VLDL Cholesterol Cal: 26 mg/dL (ref 5–40)

## 2023-03-22 ENCOUNTER — Encounter: Payer: Self-pay | Admitting: Family Medicine

## 2023-03-22 DIAGNOSIS — G8929 Other chronic pain: Secondary | ICD-10-CM | POA: Insufficient documentation

## 2023-03-22 DIAGNOSIS — M542 Cervicalgia: Secondary | ICD-10-CM | POA: Insufficient documentation

## 2023-03-22 DIAGNOSIS — N528 Other male erectile dysfunction: Secondary | ICD-10-CM | POA: Insufficient documentation

## 2023-03-22 NOTE — Assessment & Plan Note (Signed)
Well controlled.  No changes to medicines. Continue losartan 100 mg daily.  Continue to work on eating a healthy diet and exercise.  Labs drawn today.  

## 2023-03-22 NOTE — Assessment & Plan Note (Signed)
Control: not at goal Recommend check sugars fasting daily. Recommend check feet daily. Recommend annual eye exams. Medicines: Continue metformin 1000 mg one twice daily, Increase farxiga 10 mg daily, gabapentin 300 mg twice daily, and actos 15 mg daily. Continue to work on eating a healthy diet and exercise.  Labs drawn today.

## 2023-03-22 NOTE — Assessment & Plan Note (Signed)
Sent Tizanidine

## 2023-03-22 NOTE — Assessment & Plan Note (Signed)
Increase nexium 40 mg twice daily.

## 2023-03-22 NOTE — Assessment & Plan Note (Addendum)
History of erectile dysfunction, not responsive to testosterone therapy. Testosterone levels in the low normal range. -Refer to urology for further evaluation and management.

## 2023-03-22 NOTE — Assessment & Plan Note (Signed)
Well controlled.  No changes to medicines. Continue lipitor 20 mg daily.  Continue to work on eating a healthy diet and exercise.  Labs drawn today.   

## 2023-03-22 NOTE — Assessment & Plan Note (Deleted)
The current medical regimen is effective;  continue present plan and medications.  

## 2023-03-22 NOTE — Assessment & Plan Note (Signed)
Referring to urology.

## 2023-03-24 LAB — MICROALBUMIN / CREATININE URINE RATIO
Creatinine, Urine: 77.1 mg/dL
Microalb/Creat Ratio: 22 mg/g{creat} (ref 0–29)
Microalbumin, Urine: 16.9 ug/mL

## 2023-03-25 ENCOUNTER — Ambulatory Visit: Payer: Medicare HMO

## 2023-03-25 ENCOUNTER — Other Ambulatory Visit: Payer: Self-pay | Admitting: Family Medicine

## 2023-03-25 DIAGNOSIS — E875 Hyperkalemia: Secondary | ICD-10-CM | POA: Diagnosis not present

## 2023-03-25 MED ORDER — TRESIBA FLEXTOUCH 200 UNIT/ML ~~LOC~~ SOPN: 20.0000 [IU] | PEN_INJECTOR | Freq: Every evening | SUBCUTANEOUS | 2 refills | Status: DC

## 2023-03-26 ENCOUNTER — Other Ambulatory Visit: Payer: Self-pay | Admitting: Family Medicine

## 2023-03-26 LAB — COMPREHENSIVE METABOLIC PANEL
ALT: 29 [IU]/L (ref 0–44)
AST: 20 [IU]/L (ref 0–40)
Albumin: 4.4 g/dL (ref 3.8–4.8)
Alkaline Phosphatase: 100 [IU]/L (ref 44–121)
BUN/Creatinine Ratio: 14 (ref 10–24)
BUN: 16 mg/dL (ref 8–27)
Bilirubin Total: 0.6 mg/dL (ref 0.0–1.2)
CO2: 24 mmol/L (ref 20–29)
Calcium: 10.1 mg/dL (ref 8.6–10.2)
Chloride: 99 mmol/L (ref 96–106)
Creatinine, Ser: 1.12 mg/dL (ref 0.76–1.27)
Globulin, Total: 2.3 g/dL (ref 1.5–4.5)
Glucose: 244 mg/dL — ABNORMAL HIGH (ref 70–99)
Potassium: 5.7 mmol/L — ABNORMAL HIGH (ref 3.5–5.2)
Sodium: 136 mmol/L (ref 134–144)
Total Protein: 6.7 g/dL (ref 6.0–8.5)
eGFR: 70 mL/min/{1.73_m2} (ref >59)

## 2023-03-26 MED ORDER — SODIUM POLYSTYRENE SULFONATE PO POWD: Freq: Four times a day (QID) | ORAL | 0 refills | Status: DC

## 2023-03-28 ENCOUNTER — Other Ambulatory Visit: Payer: Self-pay | Admitting: Family Medicine

## 2023-03-28 ENCOUNTER — Telehealth: Payer: Self-pay

## 2023-03-28 ENCOUNTER — Other Ambulatory Visit: Payer: Self-pay

## 2023-03-28 DIAGNOSIS — M1A0610 Idiopathic chronic gout, right knee, without tophus (tophi): Secondary | ICD-10-CM

## 2023-03-28 MED ORDER — SODIUM POLYSTYRENE SULFONATE PO POWD: ORAL | 0 refills | Status: DC

## 2023-03-28 NOTE — Telephone Encounter (Signed)
Called patient made him aware medication has been sent in, also verify pharmacy was correct.

## 2023-03-28 NOTE — Telephone Encounter (Signed)
[facility] called they don't have powder, Dr. Sedalia Muta gave verbal to give liquid.Marland Kitchen

## 2023-03-28 NOTE — Telephone Encounter (Signed)
Copied from CRM [id number]. Topic: Clinical - Prescription Issue >> Mar 27, 2023  4:38 PM Tiffany H wrote: Reason for CRM: Nathaniel Nicholson with [facility] called to advise that the pharmacy needs specific amount of powder per dose.  Please clarify the 3 doses at bedtime. What is that quantity? Please assist.

## 2023-03-28 NOTE — Telephone Encounter (Signed)
Per Dr. Sedalia Muta patient should take 15 g by mouth in the morning, at noon, and evening for four days. Resent rx to pharmacy

## 2023-03-28 NOTE — Telephone Encounter (Signed)
Copied from CRM [id number]. Topic: Clinical - Prescription Issue >> Mar 28, 2023  9:46 AM Theodis Sato wrote: Reason for CRM: PT's wife is is inquiring about the potassium medication that Dr. Sedalia Muta is going to send into his pharmacy - PT's wife states it has not been sent into the pharmacy.

## 2023-03-29 ENCOUNTER — Other Ambulatory Visit: Payer: Self-pay | Admitting: Family Medicine

## 2023-03-29 DIAGNOSIS — E1142 Type 2 diabetes mellitus with diabetic polyneuropathy: Secondary | ICD-10-CM

## 2023-03-31 DIAGNOSIS — I10 Essential (primary) hypertension: Secondary | ICD-10-CM | POA: Diagnosis not present

## 2023-03-31 DIAGNOSIS — E119 Type 2 diabetes mellitus without complications: Secondary | ICD-10-CM | POA: Diagnosis not present

## 2023-03-31 DIAGNOSIS — R059 Cough, unspecified: Secondary | ICD-10-CM | POA: Diagnosis not present

## 2023-03-31 DIAGNOSIS — J069 Acute upper respiratory infection, unspecified: Secondary | ICD-10-CM | POA: Diagnosis not present

## 2023-04-01 DIAGNOSIS — R945 Abnormal results of liver function studies: Secondary | ICD-10-CM | POA: Diagnosis not present

## 2023-04-01 DIAGNOSIS — I10 Essential (primary) hypertension: Secondary | ICD-10-CM | POA: Diagnosis not present

## 2023-05-21 ENCOUNTER — Other Ambulatory Visit: Payer: Self-pay | Admitting: Family Medicine

## 2023-05-21 DIAGNOSIS — E1142 Type 2 diabetes mellitus with diabetic polyneuropathy: Secondary | ICD-10-CM

## 2023-06-05 ENCOUNTER — Other Ambulatory Visit: Payer: Self-pay | Admitting: Family Medicine

## 2023-06-05 DIAGNOSIS — E782 Mixed hyperlipidemia: Secondary | ICD-10-CM

## 2023-06-20 ENCOUNTER — Other Ambulatory Visit: Payer: Self-pay | Admitting: Family Medicine

## 2023-06-20 DIAGNOSIS — M1A0610 Idiopathic chronic gout, right knee, without tophus (tophi): Secondary | ICD-10-CM

## 2023-07-18 ENCOUNTER — Ambulatory Visit: Payer: Medicare HMO | Admitting: Family Medicine

## 2023-07-24 NOTE — Progress Notes (Unsigned)
 Subjective:  Patient ID: Nathaniel Bunde Sr., male    DOB: 05/10/1950  Age: 73 y.o. MRN: [id number]  Chief Complaint  Patient presents with   Medical Management of Chronic Issues   HPI   Diabetes:  Complications: neuropathy Glucose checking: daily Glucose logs: 110-161 Hypoglycemia: no  Most recent A1C: 7.1 Current medications: metformin 1000 mg one twice daily, farxiga 10 mg daily, Actos 15 mg daily Last Eye Exam: 02/2023. Glaucoma: eye drops. Foot checks: daily   Hyperlipidemia: Current medications: lipitor 20 mg daily.    Hypertension: Current medications:  Losartan 100 mg daily    GERD: nexium 40 mg daily.    Hypogonadism: Has not been taking testosterone. Diet: healthy.   Exercise: walk a lot at work.    Discussed the use of AI scribe software for clinical note transcription with the patient, who gave verbal consent to proceed.         03/20/2023    8:15 AM 12/25/2022    9:32 AM 03/15/2022    8:03 AM 12/21/2021    8:02 AM 05/29/2021    8:18 AM  Depression screen PHQ 2/9  Decreased Interest 0 0 0 0 0  Down, Depressed, Hopeless 0 0 0 0 0  PHQ - 2 Score 0 0 0 0 0  Altered sleeping 0      Tired, decreased energy 0      Change in appetite 0      Feeling bad or failure about yourself  0      Trouble concentrating 0      Moving slowly or fidgety/restless 0      Suicidal thoughts 0      PHQ-9 Score 0      Difficult doing work/chores Not difficult at all            03/20/2023    8:15 AM  Fall Risk   Falls in the past year? 0  Number falls in past yr: 0  Injury with Fall? 0  Risk for fall due to : No Fall Risks  Follow up Education provided    Patient Care Team: Blane Ohara, MD as PCP - General (Family Medicine)   Review of Systems  Constitutional:  Negative for appetite change, fatigue and fever.  HENT:  Negative for congestion, ear pain, sinus pressure and sore throat.   Eyes: Negative.   Respiratory:  Negative for cough, chest tightness,  shortness of breath and wheezing.   Cardiovascular:  Negative for chest pain and palpitations.  Gastrointestinal:  Negative for abdominal pain, constipation, diarrhea, nausea and vomiting.  Endocrine: Negative.   Genitourinary:  Negative for dysuria, frequency, hematuria and urgency.  Musculoskeletal:  Negative for arthralgias, back pain, joint swelling and myalgias.  Skin:  Negative for rash.  Allergic/Immunologic: Negative.   Neurological:  Negative for dizziness, weakness and headaches.  Psychiatric/Behavioral:  Negative for dysphoric mood. The patient is not nervous/anxious.     Current Outpatient Medications on File Prior to Visit  Medication Sig Dispense Refill   allopurinol (ZYLOPRIM) 100 MG tablet Take 1 tablet by mouth once daily 90 tablet 0   atorvastatin (LIPITOR) 20 MG tablet Take 1 tablet by mouth once daily 90 tablet 0   colchicine 0.6 MG tablet Take 1 tablet (0.6 mg total) by mouth 2 (two) times daily. 14 tablet 1   esomeprazole (NEXIUM) 40 MG capsule Take 1 capsule (40 mg total) by mouth 2 (two) times daily before a meal. 180 capsule 0   FARXIGA 10  MG TABS tablet Take 1 tablet (10 mg total) by mouth daily before breakfast. 30 tablet 2   gabapentin (NEURONTIN) 300 MG capsule Take 1 capsule by mouth twice daily 180 capsule 0   insulin degludec (TRESIBA FLEXTOUCH) 200 UNIT/ML FlexTouch Pen Inject 20 Units into the skin at bedtime. 3 mL 2   losartan (COZAAR) 100 MG tablet Take 1 tablet by mouth once daily 90 tablet 0   metFORMIN (GLUCOPHAGE) 1000 MG tablet TAKE 1 TABLET BY MOUTH TWICE DAILY WITH A MEAL 180 tablet 1   pioglitazone (ACTOS) 30 MG tablet Take 1 tablet (30 mg total) by mouth daily. 90 tablet 0   sodium polystyrene (KAYEXALATE) powder Take 15 g by mouth in the morning, at noon, and bedtime, for four days 454 g 0   tadalafil (CIALIS) 10 MG tablet Take 1 tablet (10 mg total) by mouth daily. 30 tablet 3   testosterone cypionate (DEPOTESTOSTERONE CYPIONATE) 200 MG/ML  injection Inject 1 mL (200 mg total) into the muscle every 14 (fourteen) days. (Patient not taking: Reported on 03/20/2023) 2 mL 5   timolol (TIMOPTIC) 0.25 % ophthalmic solution 1 drop 2 (two) times daily.     tiZANidine (ZANAFLEX) 4 MG tablet Take 1 tablet (4 mg total) by mouth at bedtime. 30 tablet 0   No current facility-administered medications on file prior to visit.   Past Medical History:  Diagnosis Date   Diabetes mellitus without complication (HCC)    GERD (gastroesophageal reflux disease)    Hyperlipemia    Hypertension    Nicotine dependence, cigarettes, uncomplicated    Onychomycosis of great toe 02/21/2021   Other male erectile dysfunction    Peptic ulcer 2000   Testicular hypofunction    Vitamin D deficiency    Past Surgical History:  Procedure Laterality Date   CATARACT EXTRACTION Left 2017    Family History  Problem Relation Age of Onset   Cancer Father        lung   Cancer Sister        lung   Cancer Brother        throat   Hypertension Other    Diabetes Other    Social History   Socioeconomic History   Marital status: Married    Spouse name: [name]   Number of children: Not on file   Years of education: Not on file   Highest education level: Not on file  Occupational History   Occupation: Marine scientist  Tobacco Use   Smoking status: Every Day    Current packs/day: 0.50    Average packs/day: 0.5 packs/day for 50.0 years (25.0 ttl pk-yrs)    Types: Cigarettes   Smokeless tobacco: Never   Tobacco comments:    1 ppd for > 30 years.  Vaping Use   Vaping status: Never Used  Substance and Sexual Activity   Alcohol use: Yes    Alcohol/week: 28.0 standard drinks of alcohol    Types: 28 Cans of beer per week    Comment: 4/daily   Drug use: No   Sexual activity: Not on file  Other Topics Concern   Not on file  Social History Narrative   Not on file   Social Drivers of Health   Financial Resource Strain: Low Risk  (12/25/2022)   Overall  Financial Resource Strain (CARDIA)    Difficulty of Paying Living Expenses: Not very hard  Food Insecurity: No Food Insecurity (12/25/2022)   Hunger Vital Sign    Worried About Running  Out of Food in the Last Year: Never true    Ran Out of Food in the Last Year: Never true  Transportation Needs: No Transportation Needs (12/25/2022)   PRAPARE - Administrator, Civil Service (Medical): No    Lack of Transportation (Non-Medical): No  Physical Activity: Sufficiently Active (12/25/2022)   Exercise Vital Sign    Days of Exercise per Week: 5 days    Minutes of Exercise per Session: 30 min  Stress: No Stress Concern Present (12/25/2022)   [employer] of Occupational Health - Occupational Stress Questionnaire    Feeling of Stress : Not at all  Social Connections: Moderately Integrated (12/25/2022)   Social Connection and Isolation Panel [NHANES]    Frequency of Communication with Friends and Family: Three times a week    Frequency of Social Gatherings with Friends and Family: Three times a week    Attends Religious Services: More than 4 times per year    Active Member of Clubs or Organizations: No    Attends Banker Meetings: Never    Marital Status: Married    Objective:  There were no vitals taken for this visit.     03/20/2023    8:09 AM 10/31/2022    1:40 PM 06/21/2022    7:56 AM  BP/Weight  Systolic BP 118 120 136  Diastolic BP 70 80 68  Wt. (Lbs) 214 206.2 209  BMI 26.75 kg/m2 25.77 kg/m2 26.12 kg/m2    Physical Exam  Diabetic Foot Exam - Simple   No data filed      Lab Results  Component Value Date   WBC 3.6 03/20/2023   HGB 14.1 03/20/2023   HCT 43.7 03/20/2023   PLT 187 03/20/2023   GLUCOSE 244 (H) 03/25/2023   CHOL 120 03/20/2023   TRIG 156 (H) 03/20/2023   HDL 46 03/20/2023   LDLCALC 48 03/20/2023   ALT 29 03/25/2023   AST 20 03/25/2023   NA 136 03/25/2023   K 5.7 (H) 03/25/2023   CL 99 03/25/2023   CREATININE 1.12 03/25/2023    BUN 16 03/25/2023   CO2 24 03/25/2023   TSH 1.280 06/21/2022   HGBA1C 8.5 (H) 03/20/2023   MICROALBUR 0 02/21/2021      Assessment & Plan:  Assessment and Plan       There are no diagnoses linked to this encounter.   No orders of the defined types were placed in this encounter.   No orders of the defined types were placed in this encounter.    Follow-up: No follow-ups on file.   I,Angela Taylor,acting as a Neurosurgeon for Renne Crigler, FNP.,have documented all relevant documentation on the behalf of Renne Crigler, FNP,as directed by  Renne Crigler, FNP while in the presence of Renne Crigler, FNP.   An After Visit Summary was printed and given to the patient.  Renne Crigler, FNP [facility] [phone]

## 2023-07-25 ENCOUNTER — Encounter: Payer: Self-pay | Admitting: Family Medicine

## 2023-07-25 ENCOUNTER — Ambulatory Visit: Payer: Medicare HMO | Admitting: Family Medicine

## 2023-07-25 VITALS — BP 132/74 | HR 87 | Temp 97.8°F | Resp 16 | Ht 75.0 in | Wt 206.0 lb

## 2023-07-25 DIAGNOSIS — E291 Testicular hypofunction: Secondary | ICD-10-CM

## 2023-07-25 DIAGNOSIS — E782 Mixed hyperlipidemia: Secondary | ICD-10-CM | POA: Diagnosis not present

## 2023-07-25 DIAGNOSIS — K219 Gastro-esophageal reflux disease without esophagitis: Secondary | ICD-10-CM | POA: Diagnosis not present

## 2023-07-25 DIAGNOSIS — E1159 Type 2 diabetes mellitus with other circulatory complications: Secondary | ICD-10-CM | POA: Diagnosis not present

## 2023-07-25 DIAGNOSIS — I152 Hypertension secondary to endocrine disorders: Secondary | ICD-10-CM

## 2023-07-25 DIAGNOSIS — E1142 Type 2 diabetes mellitus with diabetic polyneuropathy: Secondary | ICD-10-CM | POA: Diagnosis not present

## 2023-07-25 MED ORDER — GABAPENTIN 300 MG PO CAPS: 300.0000 mg | ORAL_CAPSULE | Freq: Two times a day (BID) | ORAL | 0 refills | Status: DC

## 2023-07-25 NOTE — Patient Instructions (Signed)
 Mediterranean Diet A Mediterranean diet is based on the traditions of countries on the [employer]. It focuses on eating more: Fruits and vegetables. Whole grains, beans, nuts, and seeds. Heart-healthy fats. These are fats that are good for your heart. It involves eating less: Dairy. Meat and eggs. Processed foods with added sugar, salt, and fat. This type of diet can help prevent certain conditions. It can also improve outcomes if you have a long-term (chronic) disease, such as kidney or heart disease. What are tips for following this plan? Reading food labels Check packaged foods for: The serving size. For foods such as rice and pasta, the serving size is the amount of cooked product, not dry. The total fat. Avoid foods with saturated fat or trans fat. Added sugars, such as corn syrup. Shopping  Try to have a balanced diet. Buy a variety of foods, such as: Fresh fruits and vegetables. You may be able to get these from local farmers markets. You can also buy them frozen. Grains, beans, nuts, and seeds. Some of these can be bought in bulk. Fresh seafood. Poultry and eggs. Low-fat dairy products. Buy whole ingredients instead of foods that have already been packaged. If you can't get fresh seafood, buy precooked frozen shrimp or canned fish, such as tuna, salmon, or sardines. Stock your pantry so you always have certain foods on hand, such as olive oil, canned tuna, canned tomatoes, rice, pasta, and beans. Cooking Cook foods with extra-virgin olive oil instead of using butter or other vegetable oils. Have meat as a side dish. Have vegetables or grains as your main dish. This means having meat in small portions or adding small amounts of meat to foods like pasta or stew. Use beans or vegetables instead of meat in common dishes like chili or lasagna. Try out different cooking methods. Try roasting, broiling, steaming, and sauting vegetables. Add frozen vegetables to soups, stews,  pasta, or rice. Add nuts or seeds for added healthy fats and plant protein at each meal. You can add these to yogurt, salads, or vegetable dishes. Marinate fish or vegetables using olive oil, lemon juice, garlic, and fresh herbs. Meal planning Plan to eat a vegetarian meal one day each week. Try to work up to two vegetarian meals, if possible. Eat seafood two or more times a week. Have healthy snacks on hand. These may include: Vegetable sticks with hummus. Greek yogurt. Fruit and nut trail mix. Eat balanced meals. These should include: Fruit: 2-3 servings a day. Vegetables: 4-5 servings a day. Low-fat dairy: 2 servings a day. Fish, poultry, or lean meat: 1 serving a day. Beans and legumes: 2 or more servings a week. Nuts and seeds: 1-2 servings a day. Whole grains: 6-8 servings a day. Extra-virgin olive oil: 3-4 servings a day. Limit red meat and sweets to just a few servings a month. Lifestyle  Try to cook and eat meals with your family. Drink enough fluid to keep your pee (urine) pale yellow. Be active every day. This includes: Aerobic exercise, which is exercise that causes your heart to beat faster. Examples include running and swimming. Leisure activities like gardening, walking, or housework. Get 7-8 hours of sleep each night. Drink red wine if your provider says you can. A glass of wine is 5 oz (150 mL). You may be allowed to have: Up to 1 glass a day if you're male and not pregnant. Up to 2 glasses a day if you're male. What foods should I eat? Fruits Apples. Apricots. Avocado.  Berries. Bananas. Cherries. Dates. Figs. Grapes. Lemons. Melon. Oranges. Peaches. Plums. Pomegranate. Vegetables Artichokes. Beets. Broccoli. Cabbage. Carrots. Eggplant. Green beans. Chard. Kale. Spinach. Onions. Leeks. Peas. Squash. Tomatoes. Peppers. Radishes. Grains Whole-grain pasta. Brown rice. Bulgur wheat. Polenta. Couscous. Whole-wheat bread. Orpah Cobb. Meats and other  proteins Beans. Almonds. Sunflower seeds. Pine nuts. Peanuts. Cod. Salmon. Scallops. Shrimp. Tuna. Tilapia. Clams. Oysters. Eggs. Chicken or [place] without skin. Dairy Low-fat milk. Cheese. Greek yogurt. Fats and oils Extra-virgin olive oil. Avocado oil. Grapeseed oil. Beverages Water. Red wine. Herbal tea. Sweets and desserts Greek yogurt with honey. Baked apples. Poached pears. Trail mix. Seasonings and condiments Basil. Cilantro. Coriander. Cumin. Mint. Parsley. Sage. Rosemary. Tarragon. Garlic. Oregano. Thyme. Pepper. Balsamic vinegar. Tahini. Hummus. Tomato sauce. Olives. Mushrooms. The items listed above may not be all the foods and drinks you can have. Talk to a dietitian to learn more. What foods should I limit? This is a list of foods that should be eaten rarely. Fruits Fruit canned in syrup. Vegetables Deep-fried potatoes, like [place] fries. Grains Packaged pasta or rice dishes. Cereal with added sugar. Snacks with added sugar. Meats and other proteins Beef. Pork. Lamb. Chicken or [place] with skin. Hot dogs. Tomasa Blase. Dairy Ice cream. Sour cream. Whole milk. Fats and oils Butter. Canola oil. Vegetable oil. Beef fat (tallow). Lard. Beverages Juice. Sugar-sweetened soft drinks. Beer. Liquor and spirits. Sweets and desserts Cookies. Cakes. Pies. Candy. Seasonings and condiments Mayonnaise. Pre-made sauces and marinades. The items listed above may not be all the foods and drinks you should limit. Talk to a dietitian to learn more. Where to find more information American Heart Association (AHA): heart.org This information is not intended to replace advice given to you by your health care provider. Make sure you discuss any questions you have with your health care provider. Document Revised: 07/14/2022 Document Reviewed: 07/14/2022 Elsevier Patient Education  2024 [employer].

## 2023-07-25 NOTE — Assessment & Plan Note (Signed)
 Hypertension Well-controlled with losartan 100 mg daily. Recent BP 132/74 mmHg. BP Readings from Last 3 Encounters:  07/25/23 132/74  03/20/23 118/70  10/31/22 120/80   - Continue losartan 100 mg daily. - Monitor blood pressure.  Type 2 Diabetes Mellitus Managed with metformin, Farxiga, Actos, and Tresiba. Glucose readings in 130s-140s, A1c 8.5%. Tresiba 25 units daily for glycemic control. Advised dietary monitoring, reducing rice, pasta, potatoes. Provided Mediterranean diet information. - Continue metformin, Farxiga, Actos, and Tresiba. - Monitor blood glucose levels. - Provide Mediterranean diet information. - Continue to work on eating a healthy diet and exercise.  - Labs drawn today.

## 2023-07-25 NOTE — Assessment & Plan Note (Signed)
 Control: not at goal A1C - 8.5% Recommend check sugars fasting daily. Recommend check feet daily. Recommend annual eye exams. Medicines: Continue metformin 1000 mg one twice daily, Increase farxiga 10 mg daily, gabapentin 300 mg twice daily, and actos 15 mg daily. Tresiba 25 units once daily in the morning Continue to work on eating a healthy diet and exercise.  Labs drawn today.

## 2023-07-25 NOTE — Assessment & Plan Note (Signed)
 Currently not on any medication. Labs drawn, Await labs/testing for assessment and recommendations

## 2023-07-25 NOTE — Assessment & Plan Note (Signed)
 Managed with Nexium 40 mg by mouth BID. No symptoms reported. - Continue Nexium 40 mg twice a day.

## 2023-07-25 NOTE — Assessment & Plan Note (Signed)
 Well controlled.  Managed with Lipitor 20 mg. No side effects. Advised dietary modifications to manage cholesterol. - Continue Lipitor 20 mg daily. - Provide dietary modification information. - Labs drawn today.

## 2023-07-26 ENCOUNTER — Other Ambulatory Visit: Payer: Self-pay | Admitting: Family Medicine

## 2023-07-26 DIAGNOSIS — E1159 Type 2 diabetes mellitus with other circulatory complications: Secondary | ICD-10-CM

## 2023-07-26 DIAGNOSIS — E1142 Type 2 diabetes mellitus with diabetic polyneuropathy: Secondary | ICD-10-CM

## 2023-07-28 LAB — CBC WITH DIFFERENTIAL/PLATELET
Basophils Absolute: 0 10*3/uL (ref 0.0–0.2)
Basos: 1 %
EOS (ABSOLUTE): 0.1 10*3/uL (ref 0.0–0.4)
Eos: 3 %
Hematocrit: 43.5 % (ref 37.5–51.0)
Hemoglobin: 14.5 g/dL (ref 13.0–17.7)
Immature Grans (Abs): 0 10*3/uL (ref 0.0–0.1)
Immature Granulocytes: 0 %
Lymphocytes Absolute: 1.5 10*3/uL (ref 0.7–3.1)
Lymphs: 35 %
MCH: 31.7 pg (ref 26.6–33.0)
MCHC: 33.3 g/dL (ref 31.5–35.7)
MCV: 95 fL (ref 79–97)
Monocytes Absolute: 0.4 10*3/uL (ref 0.1–0.9)
Monocytes: 10 %
Neutrophils Absolute: 2.1 10*3/uL (ref 1.4–7.0)
Neutrophils: 51 %
Platelets: 195 10*3/uL (ref 150–450)
RBC: 4.57 x10E6/uL (ref 4.14–5.80)
RDW: 12.8 % (ref 11.6–15.4)
WBC: 4.1 10*3/uL (ref 3.4–10.8)

## 2023-07-28 LAB — CMP14+EGFR
ALT: 19 IU/L (ref 0–44)
AST: 19 IU/L (ref 0–40)
Albumin: 4.3 g/dL (ref 3.8–4.8)
Alkaline Phosphatase: 81 IU/L (ref 44–121)
BUN/Creatinine Ratio: 14 (ref 10–24)
BUN: 16 mg/dL (ref 8–27)
Bilirubin Total: 0.3 mg/dL (ref 0.0–1.2)
CO2: 23 mmol/L (ref 20–29)
Calcium: 9.9 mg/dL (ref 8.6–10.2)
Chloride: 104 mmol/L (ref 96–106)
Creatinine, Ser: 1.17 mg/dL (ref 0.76–1.27)
Globulin, Total: 2.3 g/dL (ref 1.5–4.5)
Glucose: 128 mg/dL — ABNORMAL HIGH (ref 70–99)
Potassium: 5 mmol/L (ref 3.5–5.2)
Sodium: 143 mmol/L (ref 134–144)
Total Protein: 6.6 g/dL (ref 6.0–8.5)
eGFR: 67 mL/min/{1.73_m2} (ref >59)

## 2023-07-28 LAB — MICROALBUMIN / CREATININE URINE RATIO
Creatinine, Urine: 41.9 mg/dL
Microalb/Creat Ratio: 30 mg/g{creat} — ABNORMAL HIGH (ref 0–29)
Microalbumin, Urine: 12.4 ug/mL

## 2023-07-28 LAB — LIPID PANEL
Chol/HDL Ratio: 2.2 ratio (ref 0.0–5.0)
Cholesterol, Total: 112 mg/dL (ref 100–199)
HDL: 52 mg/dL (ref >39)
LDL Chol Calc (NIH): 46 mg/dL (ref 0–99)
Triglycerides: 68 mg/dL (ref 0–149)
VLDL Cholesterol Cal: 14 mg/dL (ref 5–40)

## 2023-07-28 LAB — HEMOGLOBIN A1C
Est. average glucose Bld gHb Est-mCnc: 186 mg/dL
Hgb A1c MFr Bld: 8.1 % — ABNORMAL HIGH (ref 4.8–5.6)

## 2023-07-28 LAB — TSH: TSH: 1.42 u[IU]/mL (ref 0.450–4.500)

## 2023-07-28 LAB — TESTOSTERONE: Testosterone: 332 ng/dL (ref 264–916)

## 2023-09-12 DIAGNOSIS — B351 Tinea unguium: Secondary | ICD-10-CM | POA: Diagnosis not present

## 2023-09-12 DIAGNOSIS — Z7984 Long term (current) use of oral hypoglycemic drugs: Secondary | ICD-10-CM | POA: Diagnosis not present

## 2023-09-12 DIAGNOSIS — E1142 Type 2 diabetes mellitus with diabetic polyneuropathy: Secondary | ICD-10-CM | POA: Diagnosis not present

## 2023-09-28 ENCOUNTER — Other Ambulatory Visit: Payer: Self-pay | Admitting: Family Medicine

## 2023-09-28 DIAGNOSIS — E1142 Type 2 diabetes mellitus with diabetic polyneuropathy: Secondary | ICD-10-CM

## 2023-10-28 ENCOUNTER — Other Ambulatory Visit: Payer: Self-pay | Admitting: Family Medicine

## 2023-10-28 DIAGNOSIS — E1159 Type 2 diabetes mellitus with other circulatory complications: Secondary | ICD-10-CM

## 2023-10-28 DIAGNOSIS — E1142 Type 2 diabetes mellitus with diabetic polyneuropathy: Secondary | ICD-10-CM

## 2023-10-31 ENCOUNTER — Encounter: Payer: Self-pay | Admitting: Family Medicine

## 2023-10-31 ENCOUNTER — Ambulatory Visit (INDEPENDENT_AMBULATORY_CARE_PROVIDER_SITE_OTHER): Admitting: Family Medicine

## 2023-10-31 VITALS — BP 134/72 | HR 81 | Temp 98.0°F | Ht 75.0 in | Wt 210.0 lb

## 2023-10-31 DIAGNOSIS — I152 Hypertension secondary to endocrine disorders: Secondary | ICD-10-CM | POA: Diagnosis not present

## 2023-10-31 DIAGNOSIS — E782 Mixed hyperlipidemia: Secondary | ICD-10-CM

## 2023-10-31 DIAGNOSIS — Z1211 Encounter for screening for malignant neoplasm of colon: Secondary | ICD-10-CM

## 2023-10-31 DIAGNOSIS — Z87891 Personal history of nicotine dependence: Secondary | ICD-10-CM | POA: Diagnosis not present

## 2023-10-31 DIAGNOSIS — E1142 Type 2 diabetes mellitus with diabetic polyneuropathy: Secondary | ICD-10-CM | POA: Diagnosis not present

## 2023-10-31 DIAGNOSIS — K219 Gastro-esophageal reflux disease without esophagitis: Secondary | ICD-10-CM

## 2023-10-31 DIAGNOSIS — E1159 Type 2 diabetes mellitus with other circulatory complications: Secondary | ICD-10-CM | POA: Diagnosis not present

## 2023-10-31 DIAGNOSIS — Z23 Encounter for immunization: Secondary | ICD-10-CM | POA: Diagnosis not present

## 2023-10-31 MED ORDER — NICOTINE 7 MG/24HR TD PT24: 7.0000 mg | MEDICATED_PATCH | Freq: Every day | TRANSDERMAL | 0 refills | Status: AC

## 2023-10-31 MED ORDER — NICOTINE 14 MG/24HR TD PT24: 14.0000 mg | MEDICATED_PATCH | Freq: Every day | TRANSDERMAL | 0 refills | Status: AC

## 2023-10-31 MED ORDER — GABAPENTIN 300 MG PO CAPS: 300.0000 mg | ORAL_CAPSULE | Freq: Three times a day (TID) | ORAL | 3 refills | Status: AC

## 2023-10-31 NOTE — Patient Instructions (Signed)
 Increase gabapentin  to 300 mg three times a day.   Nicoderm patches sent.  14 mcg patch once daily x 1 month 7 mcg once daily x 2-4 weeks.

## 2023-10-31 NOTE — Progress Notes (Signed)
 Subjective:  Patient ID: Nathaniel Silvan Sr., male    DOB: 1950/10/04  Age: 73 y.o. MRN: [id number]  Chief Complaint  Patient presents with   Medical Management of Chronic Issues    HPI: Diabetes:  Complications: neuropathy Glucose checking: daily Glucose logs: 125 - 140 Hypoglycemia: no  Most recent A1C: 8.1 Current medications: metformin  1000 mg one twice daily, farxiga  10 mg daily, Actos  15 mg daily, and tresiba  25 units once a day in the morning. Continue gabapentin  300 mg three times a day. Last Eye Exam: 02/2023. Glaucoma: eye drops. Foot checks: daily   Hyperlipidemia: Current medications: lipitor 20 mg daily.    Hypertension: Current medications:  Losartan  100 mg daily    GERD: nexium  40 mg daily.    Hypogonadism: Has not been taking testosterone .  Gout: on allopurinol  100 mg daily. Uses colchicine  as needed for gout flare.    Diet: healthy, tries to eat well.   Exercise: walk a lot at work     03/20/2023    8:15 AM 12/25/2022    9:32 AM 03/15/2022    8:03 AM 12/21/2021    8:02 AM 05/29/2021    8:18 AM  Depression screen PHQ 2/9  Decreased Interest 0 0 0 0 0  Down, Depressed, Hopeless 0 0 0 0 0  PHQ - 2 Score 0 0 0 0 0  Altered sleeping 0      Tired, decreased energy 0      Change in appetite 0      Feeling bad or failure about yourself  0      Trouble concentrating 0      Moving slowly or fidgety/restless 0      Suicidal thoughts 0      PHQ-9 Score 0      Difficult doing work/chores Not difficult at all            03/20/2023    8:15 AM  Fall Risk   Falls in the past year? 0  Number falls in past yr: 0  Injury with Fall? 0  Risk for fall due to : No Fall Risks  Follow up Education provided    Patient Care Team: Sherre Clapper, MD as PCP - General (Family Medicine)   Review of Systems  Constitutional:  Negative for chills, diaphoresis, fatigue and fever.  HENT:  Negative for congestion, ear pain and sore throat.   Respiratory:  Negative for  cough and shortness of breath.   Cardiovascular:  Negative for chest pain and leg swelling.  Gastrointestinal:  Negative for abdominal pain, constipation, diarrhea, nausea and vomiting.  Genitourinary:  Negative for dysuria and urgency.  Musculoskeletal:  Negative for arthralgias and myalgias.  Neurological:  Negative for dizziness and headaches.  Psychiatric/Behavioral:  Negative for dysphoric mood.     Current Outpatient Medications on File Prior to Visit  Medication Sig Dispense Refill   albuterol (VENTOLIN HFA) 108 (90 Base) MCG/ACT inhaler Inhale 2 puffs into the lungs every 4 (four) hours as needed for wheezing or shortness of breath. As needed     allopurinol  (ZYLOPRIM ) 100 MG tablet Take 1 tablet by mouth once daily 90 tablet 0   atorvastatin  (LIPITOR) 20 MG tablet Take 1 tablet by mouth once daily 90 tablet 0   colchicine  0.6 MG tablet Take 1 tablet (0.6 mg total) by mouth 2 (two) times daily. 14 tablet 1   esomeprazole  (NEXIUM ) 40 MG capsule Take 1 capsule (40 mg total) by mouth 2 (two) times  daily before a meal. 180 capsule 0   FARXIGA  10 MG TABS tablet Take 1 tablet (10 mg total) by mouth daily before breakfast. 30 tablet 2   insulin degludec  (TRESIBA  FLEXTOUCH) 200 UNIT/ML FlexTouch Pen Inject 20 Units into the skin at bedtime. 3 mL 2   losartan  (COZAAR ) 100 MG tablet Take 1 tablet by mouth once daily 90 tablet 0   metFORMIN  (GLUCOPHAGE ) 1000 MG tablet TAKE 1 TABLET BY MOUTH TWICE DAILY WITH A MEAL 180 tablet 0   pioglitazone  (ACTOS ) 30 MG tablet Take 1 tablet (30 mg total) by mouth daily. 90 tablet 0   tadalafil  (CIALIS ) 10 MG tablet Take 1 tablet (10 mg total) by mouth daily. 30 tablet 3   timolol (TIMOPTIC) 0.25 % ophthalmic solution 1 drop 2 (two) times daily.     No current facility-administered medications on file prior to visit.   Past Medical History:  Diagnosis Date   Diabetes mellitus without complication (HCC)    GERD (gastroesophageal reflux disease)     Hyperlipemia    Hypertension    Nicotine  dependence, cigarettes, uncomplicated    Onychomycosis of great toe 02/21/2021   Other male erectile dysfunction    Peptic ulcer 2000   Testicular hypofunction    Vitamin D deficiency    Past Surgical History:  Procedure Laterality Date   CATARACT EXTRACTION Left 2017    Family History  Problem Relation Age of Onset   Cancer Father        lung   Cancer Sister        lung   Cancer Brother        throat   Hypertension Other    Diabetes Other    Social History   Socioeconomic History   Marital status: Married    Spouse name: Cyntha   Number of children: Not on file   Years of education: Not on file   Highest education level: Not on file  Occupational History   Occupation: Marine scientist  Tobacco Use   Smoking status: Every Day    Current packs/day: 0.50    Average packs/day: 0.5 packs/day for 50.0 years (25.0 ttl pk-yrs)    Types: Cigarettes   Smokeless tobacco: Never   Tobacco comments:    1 ppd for > 30 years.  Vaping Use   Vaping status: Never Used  Substance and Sexual Activity   Alcohol use: Yes    Alcohol/week: 28.0 standard drinks of alcohol    Types: 28 Cans of beer per week    Comment: 4/daily   Drug use: No   Sexual activity: Not on file  Other Topics Concern   Not on file  Social History Narrative   Not on file   Social Drivers of Health   Financial Resource Strain: Low Risk  (12/25/2022)   Overall Financial Resource Strain (CARDIA)    Difficulty of Paying Living Expenses: Not very hard  Food Insecurity: No Food Insecurity (10/31/2023)   Hunger Vital Sign    Worried About Running Out of Food in the Last Year: Never true    Ran Out of Food in the Last Year: Never true  Transportation Needs: No Transportation Needs (10/31/2023)   PRAPARE - Administrator, Civil Service (Medical): No    Lack of Transportation (Non-Medical): No  Physical Activity: Sufficiently Active (12/25/2022)   Exercise  Vital Sign    Days of Exercise per Week: 5 days    Minutes of Exercise per Session: 30 min  Stress: No Stress Concern Present (12/25/2022)   [employer] of Occupational Health - Occupational Stress Questionnaire    Feeling of Stress : Not at all  Social Connections: Moderately Integrated (12/25/2022)   Social Connection and Isolation Panel    Frequency of Communication with Friends and Family: Three times a week    Frequency of Social Gatherings with Friends and Family: Three times a week    Attends Religious Services: More than 4 times per year    Active Member of Clubs or Organizations: No    Attends Banker Meetings: Never    Marital Status: Married    Objective:  BP 134/72   Pulse 81   Temp 98 F (36.7 C)   Ht 6' 3 (1.905 m)   Wt 210 lb (95.3 kg)   SpO2 98%   BMI 26.25 kg/m      10/31/2023    7:37 AM 07/25/2023    7:40 AM 03/20/2023    8:09 AM  BP/Weight  Systolic BP 134 132 118  Diastolic BP 72 74 70  Wt. (Lbs) 210 206 214  BMI 26.25 kg/m2 25.75 kg/m2 26.75 kg/m2    Physical Exam Vitals reviewed.  Constitutional:      Appearance: Normal appearance.  Neck:     Vascular: No carotid bruit.  Cardiovascular:     Rate and Rhythm: Normal rate and regular rhythm.     Pulses: Normal pulses.     Heart sounds: Normal heart sounds.  Pulmonary:     Effort: Pulmonary effort is normal.     Breath sounds: Normal breath sounds. No wheezing, rhonchi or rales.  Abdominal:     General: Bowel sounds are normal.     Palpations: Abdomen is soft.     Tenderness: There is no abdominal tenderness.  Neurological:     Mental Status: He is alert and oriented to person, place, and time.  Psychiatric:        Mood and Affect: Mood normal.        Behavior: Behavior normal.      Diabetic foot exam was performed with the following findings:   No deformities, ulcerations, or other skin breakdown Intact posterior tibialis and dorsalis pedis pulses Decreased  sensation BL feet.       Lab Results  Component Value Date   WBC 4.5 10/31/2023   HGB 15.3 10/31/2023   HCT 47.2 10/31/2023   PLT 175 10/31/2023   GLUCOSE 150 (H) 10/31/2023   CHOL 144 10/31/2023   TRIG 103 10/31/2023   HDL 54 10/31/2023   LDLCALC 71 10/31/2023   ALT 22 10/31/2023   AST 18 10/31/2023   NA 137 10/31/2023   K 5.5 (H) 10/31/2023   CL 100 10/31/2023   CREATININE 1.35 (H) 10/31/2023   BUN 23 10/31/2023   CO2 23 10/31/2023   TSH 1.420 07/25/2023   HGBA1C 7.7 (H) 10/31/2023   MICROALBUR 0 02/21/2021      Assessment & Plan:  Hypertension associated with diabetes (HCC) Assessment & Plan: Control: Uncontrolled Recommend check sugars fasting daily. Recommend check feet daily. Recommend annual eye exams. Medicines: metformin  1000 mg one twice daily, farxiga  10 mg daily, Actos  15 mg daily, and tresiba  25 units once a day in the morning. Continue to work on eating a healthy diet and exercise.  Labs drawn today.     Orders: -     Hemoglobin A1c -     CMP14+EGFR  Mixed hyperlipidemia Assessment & Plan: Well controlled.  Managed with Lipitor 20 mg. No side effects. Advised dietary modifications to manage cholesterol. - Continue Lipitor 20 mg daily. - Provide dietary modification information. - Labs drawn today.    Orders: -     Lipid panel -     CBC with Differential/Platelet  Diabetic polyneuropathy associated with type 2 diabetes mellitus (HCC) Assessment & Plan: The current medical regimen is effective;  continue present plan and medications.  -Gabapentin    Orders: -     Gabapentin ; Take 1 capsule (300 mg total) by mouth 3 (three) times daily.  Dispense: 270 capsule; Refill: 3 -     Microalbumin / creatinine urine ratio  History of tobacco use disorder Assessment & Plan: Sent Nicotine  patch Ordering CT lung screening.  Orders: -     Nicotine ; Place 1 patch (14 mg total) onto the skin daily.  Dispense: 28 patch; Refill: 0 -     Nicotine ;  Place 1 patch (7 mg total) onto the skin daily.  Dispense: 28 patch; Refill: 0 -     CT CHEST LUNG CANCER SCREENING LOW DOSE WO CONTRAST  Gastroesophageal reflux disease without esophagitis Assessment & Plan: Managed with Nexium  40 mg by mouth BID. No symptoms reported. - Continue Nexium  40 mg twice a day.   Encounter for immunization -     Pneumococcal conjugate vaccine 20-valent  Screen for colon cancer -     Ambulatory referral to Gastroenterology     Meds ordered this encounter  Medications   gabapentin  (NEURONTIN ) 300 MG capsule    Sig: Take 1 capsule (300 mg total) by mouth 3 (three) times daily.    Dispense:  270 capsule    Refill:  3   nicotine  (NICODERM CQ  - DOSED IN MG/24 HOURS) 14 mg/24hr patch    Sig: Place 1 patch (14 mg total) onto the skin daily.    Dispense:  28 patch    Refill:  0   nicotine  (NICODERM CQ  - DOSED IN MG/24 HR) 7 mg/24hr patch    Sig: Place 1 patch (7 mg total) onto the skin daily.    Dispense:  28 patch    Refill:  0    Orders Placed This Encounter  Procedures   CT CHEST LUNG CANCER SCREENING LOW DOSE WO CONTRAST   Pneumococcal conjugate vaccine 20-valent   Hemoglobin A1c   Lipid panel   CBC with Differential/Platelet   CMP14+EGFR   Microalbumin / creatinine urine ratio   Ambulatory referral to Gastroenterology     Follow-up: Return in about 3 months (around 01/31/2024).  I,Marla I Leal-Borjas,acting as a scribe for Abigail Free, MD.,have documented all relevant documentation on the behalf of Abigail Free, MD,as directed by  Abigail Free, MD while in the presence of Abigail Free, MD.    An After Visit Summary was printed and given to the patient.  I attest that I have reviewed this visit and agree with the plan scribed by my staff.   Abigail Free, MD [facility] [phone]

## 2023-11-01 LAB — CMP14+EGFR
ALT: 22 IU/L (ref 0–44)
AST: 18 IU/L (ref 0–40)
Albumin: 4.4 g/dL (ref 3.8–4.8)
Alkaline Phosphatase: 85 IU/L (ref 44–121)
BUN/Creatinine Ratio: 17 (ref 10–24)
BUN: 23 mg/dL (ref 8–27)
Bilirubin Total: 0.4 mg/dL (ref 0.0–1.2)
CO2: 23 mmol/L (ref 20–29)
Calcium: 10.2 mg/dL (ref 8.6–10.2)
Chloride: 100 mmol/L (ref 96–106)
Creatinine, Ser: 1.35 mg/dL — ABNORMAL HIGH (ref 0.76–1.27)
Globulin, Total: 2.4 g/dL (ref 1.5–4.5)
Glucose: 150 mg/dL — ABNORMAL HIGH (ref 70–99)
Potassium: 5.5 mmol/L — ABNORMAL HIGH (ref 3.5–5.2)
Sodium: 137 mmol/L (ref 134–144)
Total Protein: 6.8 g/dL (ref 6.0–8.5)
eGFR: 56 mL/min/1.73 — ABNORMAL LOW (ref >59)

## 2023-11-01 LAB — CBC WITH DIFFERENTIAL/PLATELET
Basophils Absolute: 0 x10E3/uL (ref 0.0–0.2)
Basos: 0 %
EOS (ABSOLUTE): 0.2 x10E3/uL (ref 0.0–0.4)
Eos: 5 %
Hematocrit: 47.2 % (ref 37.5–51.0)
Hemoglobin: 15.3 g/dL (ref 13.0–17.7)
Immature Grans (Abs): 0 x10E3/uL (ref 0.0–0.1)
Immature Granulocytes: 0 %
Lymphocytes Absolute: 1.6 x10E3/uL (ref 0.7–3.1)
Lymphs: 36 %
MCH: 32.4 pg (ref 26.6–33.0)
MCHC: 32.4 g/dL (ref 31.5–35.7)
MCV: 100 fL — ABNORMAL HIGH (ref 79–97)
Monocytes Absolute: 0.5 x10E3/uL (ref 0.1–0.9)
Monocytes: 11 %
Neutrophils Absolute: 2.2 x10E3/uL (ref 1.4–7.0)
Neutrophils: 48 %
Platelets: 175 x10E3/uL (ref 150–450)
RBC: 4.72 x10E6/uL (ref 4.14–5.80)
RDW: 12.3 % (ref 11.6–15.4)
WBC: 4.5 x10E3/uL (ref 3.4–10.8)

## 2023-11-01 LAB — HEMOGLOBIN A1C
Est. average glucose Bld gHb Est-mCnc: 174 mg/dL
Hgb A1c MFr Bld: 7.7 % — ABNORMAL HIGH (ref 4.8–5.6)

## 2023-11-01 LAB — LIPID PANEL
Chol/HDL Ratio: 2.7 ratio (ref 0.0–5.0)
Cholesterol, Total: 144 mg/dL (ref 100–199)
HDL: 54 mg/dL (ref >39)
LDL Chol Calc (NIH): 71 mg/dL (ref 0–99)
Triglycerides: 103 mg/dL (ref 0–149)
VLDL Cholesterol Cal: 19 mg/dL (ref 5–40)

## 2023-11-02 ENCOUNTER — Ambulatory Visit: Payer: Self-pay | Admitting: Family Medicine

## 2023-11-02 DIAGNOSIS — Z87891 Personal history of nicotine dependence: Secondary | ICD-10-CM | POA: Insufficient documentation

## 2023-11-02 DIAGNOSIS — E875 Hyperkalemia: Secondary | ICD-10-CM

## 2023-11-02 NOTE — Assessment & Plan Note (Signed)
 The current medical regimen is effective;  continue present plan and medications.  -Gabapentin 

## 2023-11-02 NOTE — Assessment & Plan Note (Signed)
 Sent Nicotine  patch Ordering CT lung screening.

## 2023-11-02 NOTE — Assessment & Plan Note (Signed)
 Control: Uncontrolled Recommend check sugars fasting daily. Recommend check feet daily. Recommend annual eye exams. Medicines: metformin  1000 mg one twice daily, farxiga  10 mg daily, Actos  15 mg daily, and tresiba  25 units once a day in the morning. Continue to work on eating a healthy diet and exercise.  Labs drawn today.

## 2023-11-02 NOTE — Assessment & Plan Note (Signed)
 Managed with Nexium 40 mg by mouth BID. No symptoms reported. - Continue Nexium 40 mg twice a day.

## 2023-11-02 NOTE — Assessment & Plan Note (Signed)
 Well controlled.  Managed with Lipitor 20 mg. No side effects. Advised dietary modifications to manage cholesterol. - Continue Lipitor 20 mg daily. - Provide dietary modification information. - Labs drawn today.

## 2023-11-04 LAB — MICROALBUMIN / CREATININE URINE RATIO
Creatinine, Urine: 38.9 mg/dL
Microalb/Creat Ratio: 21 mg/g{creat} (ref 0–29)
Microalbumin, Urine: 8.2 ug/mL

## 2023-11-05 ENCOUNTER — Other Ambulatory Visit

## 2023-11-05 DIAGNOSIS — E875 Hyperkalemia: Secondary | ICD-10-CM | POA: Diagnosis not present

## 2023-11-05 LAB — COMPREHENSIVE METABOLIC PANEL WITH GFR
ALT: 29 IU/L (ref 0–44)
AST: 18 IU/L (ref 0–40)
Albumin: 4.4 g/dL (ref 3.8–4.8)
Alkaline Phosphatase: 93 IU/L (ref 44–121)
BUN/Creatinine Ratio: 19 (ref 10–24)
BUN: 22 mg/dL (ref 8–27)
Bilirubin Total: 0.4 mg/dL (ref 0.0–1.2)
CO2: 24 mmol/L (ref 20–29)
Calcium: 10.3 mg/dL — ABNORMAL HIGH (ref 8.6–10.2)
Chloride: 98 mmol/L (ref 96–106)
Creatinine, Ser: 1.15 mg/dL (ref 0.76–1.27)
Globulin, Total: 2.6 g/dL (ref 1.5–4.5)
Glucose: 187 mg/dL — ABNORMAL HIGH (ref 70–99)
Potassium: 5.3 mmol/L — ABNORMAL HIGH (ref 3.5–5.2)
Sodium: 138 mmol/L (ref 134–144)
Total Protein: 7 g/dL (ref 6.0–8.5)
eGFR: 68 mL/min/1.73 (ref >59)

## 2023-11-06 ENCOUNTER — Ambulatory Visit: Payer: Self-pay | Admitting: Family Medicine

## 2023-11-14 ENCOUNTER — Other Ambulatory Visit: Payer: Self-pay | Admitting: Family Medicine

## 2023-11-14 DIAGNOSIS — M1A0610 Idiopathic chronic gout, right knee, without tophus (tophi): Secondary | ICD-10-CM

## 2023-11-18 DIAGNOSIS — E119 Type 2 diabetes mellitus without complications: Secondary | ICD-10-CM | POA: Diagnosis not present

## 2023-11-18 DIAGNOSIS — Z961 Presence of intraocular lens: Secondary | ICD-10-CM | POA: Diagnosis not present

## 2023-11-18 LAB — HM DIABETES EYE EXAM

## 2023-11-25 ENCOUNTER — Encounter: Payer: Self-pay | Admitting: Family Medicine

## 2023-12-30 ENCOUNTER — Other Ambulatory Visit: Payer: Self-pay | Admitting: Family Medicine

## 2023-12-30 DIAGNOSIS — E1142 Type 2 diabetes mellitus with diabetic polyneuropathy: Secondary | ICD-10-CM

## 2024-01-11 ENCOUNTER — Other Ambulatory Visit: Payer: Self-pay | Admitting: Family Medicine

## 2024-01-11 DIAGNOSIS — E782 Mixed hyperlipidemia: Secondary | ICD-10-CM

## 2024-01-26 ENCOUNTER — Other Ambulatory Visit: Payer: Self-pay | Admitting: Family Medicine

## 2024-01-26 DIAGNOSIS — E1142 Type 2 diabetes mellitus with diabetic polyneuropathy: Secondary | ICD-10-CM

## 2024-01-26 DIAGNOSIS — I152 Hypertension secondary to endocrine disorders: Secondary | ICD-10-CM

## 2024-02-13 ENCOUNTER — Encounter: Payer: Self-pay | Admitting: Family Medicine

## 2024-02-13 ENCOUNTER — Ambulatory Visit: Admitting: Family Medicine

## 2024-02-13 VITALS — BP 138/80 | HR 83 | Temp 98.0°F | Resp 18 | Ht 75.0 in | Wt 215.2 lb

## 2024-02-13 DIAGNOSIS — E782 Mixed hyperlipidemia: Secondary | ICD-10-CM | POA: Diagnosis not present

## 2024-02-13 DIAGNOSIS — Z87891 Personal history of nicotine dependence: Secondary | ICD-10-CM | POA: Diagnosis not present

## 2024-02-13 DIAGNOSIS — I152 Hypertension secondary to endocrine disorders: Secondary | ICD-10-CM

## 2024-02-13 DIAGNOSIS — E1159 Type 2 diabetes mellitus with other circulatory complications: Secondary | ICD-10-CM | POA: Diagnosis not present

## 2024-02-13 DIAGNOSIS — Z23 Encounter for immunization: Secondary | ICD-10-CM

## 2024-02-13 DIAGNOSIS — K219 Gastro-esophageal reflux disease without esophagitis: Secondary | ICD-10-CM | POA: Diagnosis not present

## 2024-02-13 DIAGNOSIS — E1142 Type 2 diabetes mellitus with diabetic polyneuropathy: Secondary | ICD-10-CM | POA: Diagnosis not present

## 2024-02-13 LAB — COMPREHENSIVE METABOLIC PANEL WITH GFR
ALT: 26 IU/L (ref 0–44)
AST: 17 IU/L (ref 0–40)
Albumin: 4.3 g/dL (ref 3.8–4.8)
Alkaline Phosphatase: 91 IU/L (ref 47–123)
BUN/Creatinine Ratio: 17 (ref 10–24)
BUN: 19 mg/dL (ref 8–27)
Bilirubin Total: 0.4 mg/dL (ref 0.0–1.2)
CO2: 26 mmol/L (ref 20–29)
Calcium: 9.9 mg/dL (ref 8.6–10.2)
Chloride: 99 mmol/L (ref 96–106)
Creatinine, Ser: 1.12 mg/dL (ref 0.76–1.27)
Globulin, Total: 2.3 g/dL (ref 1.5–4.5)
Glucose: 161 mg/dL — ABNORMAL HIGH (ref 70–99)
Potassium: 5.3 mmol/L — ABNORMAL HIGH (ref 3.5–5.2)
Sodium: 138 mmol/L (ref 134–144)
Total Protein: 6.6 g/dL (ref 6.0–8.5)
eGFR: 70 mL/min/1.73 (ref >59)

## 2024-02-13 LAB — POCT GLYCOSYLATED HEMOGLOBIN (HGB A1C): Hemoglobin A1C: 8.6 % — AB (ref 4.0–5.6)

## 2024-02-13 MED ORDER — TRESIBA FLEXTOUCH 200 UNIT/ML ~~LOC~~ SOPN: 30.0000 [IU] | PEN_INJECTOR | Freq: Every evening | SUBCUTANEOUS | 2 refills | Status: AC

## 2024-02-13 NOTE — Patient Instructions (Addendum)
 Diabetes: increase insulin to 26 U daily.  Increase insulin by 2 U every 3 days until sugars less than 140.  Check sugar twice daily before breakfast and supper.   Ordering lung cancer screening ct scan.  Recommend quit smoking.

## 2024-02-13 NOTE — Assessment & Plan Note (Signed)
Continue nexium 40 mg twice daily

## 2024-02-13 NOTE — Progress Notes (Signed)
 Subjective:  Patient ID: Nathaniel Silvan Sr., male    DOB: [birth year]  Age: 73 y.o. MRN: [id number]  Chief Complaint  Patient presents with   Medical Management of Chronic Issues    HPI: Discussed the use of AI scribe software for clinical note transcription with the patient, who gave verbal consent to proceed.  History of Present Illness  Diabetes:  Complications: neuropathy Glucose checking: daily Glucose logs: 130 - 140s. Checks fasting only.  Hypoglycemia: no  Most recent A1C: 7.7 Current medications: metformin  1000 mg one twice daily, farxiga  10 mg daily, Actos  30 mg daily, and tresiba  20 units once a day in the morning. Continue gabapentin  300 mg three times a day. Last Eye Exam: 11/18/2023 Foot checks: daily   Hyperlipidemia: Current medications: lipitor 20 mg daily.    Hypertension: Current medications:  Losartan  100 mg daily    GERD: nexium  40 mg twice daily   Gout: on allopurinol  100 mg daily. Uses colchicine  as needed for gout flare. Has not needed in a long time.    Diet: healthy, tries to eat well.   Exercise: walk a lot at work  No desire to quit smoking.       03/20/2023    8:15 AM 12/25/2022    9:32 AM 03/15/2022    8:03 AM 12/21/2021    8:02 AM 05/29/2021    8:18 AM  Depression screen PHQ 2/9  Decreased Interest 0 0 0 0 0  Down, Depressed, Hopeless 0 0 0 0 0  PHQ - 2 Score 0 0 0 0 0  Altered sleeping 0      Tired, decreased energy 0      Change in appetite 0      Feeling bad or failure about yourself  0      Trouble concentrating 0      Moving slowly or fidgety/restless 0      Suicidal thoughts 0      PHQ-9 Score 0      Difficult doing work/chores Not difficult at all            03/20/2023    8:15 AM  Fall Risk   Falls in the past year? 0  Number falls in past yr: 0  Injury with Fall? 0  Risk for fall due to : No Fall Risks  Follow up Education provided    Patient Care Team: Sherre Clapper, MD as PCP - General (Family Medicine)   Review  of Systems  Constitutional:  Negative for chills, fatigue and fever.  HENT:  Negative for congestion, ear pain and sore throat.   Respiratory:  Negative for cough and shortness of breath.   Cardiovascular:  Negative for chest pain.  Gastrointestinal:  Negative for abdominal pain, constipation, diarrhea, nausea and vomiting.  Endocrine: Negative for polydipsia, polyphagia and polyuria.  Genitourinary:  Negative for dysuria and frequency.  Musculoskeletal:  Negative for arthralgias and myalgias.  Neurological:  Negative for dizziness and headaches.  Psychiatric/Behavioral:  Negative for dysphoric mood.        No dysphoria    Current Outpatient Medications on File Prior to Visit  Medication Sig Dispense Refill   albuterol (VENTOLIN HFA) 108 (90 Base) MCG/ACT inhaler Inhale 2 puffs into the lungs every 4 (four) hours as needed for wheezing or shortness of breath. As needed     allopurinol  (ZYLOPRIM ) 100 MG tablet Take 1 tablet by mouth once daily 90 tablet 3   atorvastatin  (LIPITOR) 20 MG tablet Take 1  tablet by mouth once daily 90 tablet 0   colchicine  0.6 MG tablet Take 1 tablet (0.6 mg total) by mouth 2 (two) times daily. 14 tablet 1   esomeprazole  (NEXIUM ) 40 MG capsule Take 1 capsule (40 mg total) by mouth 2 (two) times daily before a meal. 180 capsule 0   FARXIGA  10 MG TABS tablet Take 1 tablet (10 mg total) by mouth daily before breakfast. 30 tablet 2   gabapentin  (NEURONTIN ) 300 MG capsule Take 1 capsule (300 mg total) by mouth 3 (three) times daily. 270 capsule 3   losartan  (COZAAR ) 100 MG tablet Take 1 tablet by mouth once daily 90 tablet 0   metFORMIN  (GLUCOPHAGE ) 1000 MG tablet TAKE 1 TABLET BY MOUTH TWICE DAILY WITH A MEAL 180 tablet 0   nicotine  (NICODERM CQ  - DOSED IN MG/24 HOURS) 14 mg/24hr patch Place 1 patch (14 mg total) onto the skin daily. 28 patch 0   nicotine  (NICODERM CQ  - DOSED IN MG/24 HR) 7 mg/24hr patch Place 1 patch (7 mg total) onto the skin daily. 28 patch 0    pioglitazone  (ACTOS ) 30 MG tablet Take 1 tablet (30 mg total) by mouth daily. 90 tablet 0   tadalafil  (CIALIS ) 10 MG tablet Take 1 tablet (10 mg total) by mouth daily. 30 tablet 3   timolol (TIMOPTIC) 0.25 % ophthalmic solution 1 drop 2 (two) times daily.     No current facility-administered medications on file prior to visit.   Past Medical History:  Diagnosis Date   Diabetes mellitus without complication (HCC)    GERD (gastroesophageal reflux disease)    Hyperlipemia    Hypertension    Nicotine  dependence, cigarettes, uncomplicated    Onychomycosis of great toe 02/21/2021   Other male erectile dysfunction    Peptic ulcer 2000   Testicular hypofunction    Vitamin D deficiency    Past Surgical History:  Procedure Laterality Date   CATARACT EXTRACTION Left 2017    Family History  Problem Relation Age of Onset   Cancer Father        lung   Cancer Sister        lung   Cancer Brother        throat   Hypertension Other    Diabetes Other    Social History   Socioeconomic History   Marital status: Married    Spouse name: Cyntha   Number of children: Not on file   Years of education: Not on file   Highest education level: Not on file  Occupational History   Occupation: marine scientist  Tobacco Use   Smoking status: Every Day    Current packs/day: 0.50    Average packs/day: 0.5 packs/day for 50.0 years (25.0 ttl pk-yrs)    Types: Cigarettes   Smokeless tobacco: Never   Tobacco comments:    1 ppd for > 30 years.  Vaping Use   Vaping status: Never Used  Substance and Sexual Activity   Alcohol use: Yes    Alcohol/week: 28.0 standard drinks of alcohol    Types: 28 Cans of beer per week    Comment: 4/daily   Drug use: No   Sexual activity: Not on file  Other Topics Concern   Not on file  Social History Narrative   Not on file   Social Drivers of Health   Financial Resource Strain: Low Risk  (12/25/2022)   Overall Financial Resource Strain (CARDIA)     Difficulty of Paying Living Expenses: Not very  hard  Food Insecurity: No Food Insecurity (10/31/2023)   Hunger Vital Sign    Worried About Running Out of Food in the Last Year: Never true    Ran Out of Food in the Last Year: Never true  Transportation Needs: No Transportation Needs (10/31/2023)   PRAPARE - Administrator, Civil Service (Medical): No    Lack of Transportation (Non-Medical): No  Physical Activity: Sufficiently Active (12/25/2022)   Exercise Vital Sign    Days of Exercise per Week: 5 days    Minutes of Exercise per Session: 30 min  Stress: No Stress Concern Present (12/25/2022)   [employer] of Occupational Health - Occupational Stress Questionnaire    Feeling of Stress : Not at all  Social Connections: Moderately Integrated (12/25/2022)   Social Connection and Isolation Panel    Frequency of Communication with Friends and Family: Three times a week    Frequency of Social Gatherings with Friends and Family: Three times a week    Attends Religious Services: More than 4 times per year    Active Member of Clubs or Organizations: No    Attends Banker Meetings: Never    Marital Status: Married    Objective:  BP 138/80   Pulse 83   Temp 98 F (36.7 C) (Temporal)   Resp 18   Ht 6' 3 (1.905 m)   Wt 215 lb 3.2 oz (97.6 kg)   SpO2 98%   BMI 26.90 kg/m      02/13/2024    7:41 AM 10/31/2023    7:37 AM 07/25/2023    7:40 AM  BP/Weight  Systolic BP 138 134 132  Diastolic BP 80 72 74  Wt. (Lbs) 215.2 210 206  BMI 26.9 kg/m2 26.25 kg/m2 25.75 kg/m2    Physical Exam Vitals reviewed.  Constitutional:      Appearance: Normal appearance.  Neck:     Vascular: No carotid bruit.  Cardiovascular:     Rate and Rhythm: Normal rate and regular rhythm.     Pulses: Normal pulses.     Heart sounds: Normal heart sounds.  Pulmonary:     Effort: Pulmonary effort is normal.     Breath sounds: Normal breath sounds. No wheezing, rhonchi or rales.   Abdominal:     General: Bowel sounds are normal.     Palpations: Abdomen is soft.     Tenderness: There is no abdominal tenderness.  Neurological:     Mental Status: He is alert and oriented to person, place, and time.  Psychiatric:        Mood and Affect: Mood normal.        Behavior: Behavior normal.      Diabetic foot exam was performed with the following findings:   No deformities, ulcerations, or other skin breakdown Normal sensation of 10g monofilament Intact posterior tibialis and dorsalis pedis pulses      Lab Results  Component Value Date   WBC 4.5 10/31/2023   HGB 15.3 10/31/2023   HCT 47.2 10/31/2023   PLT 175 10/31/2023   GLUCOSE 187 (H) 11/05/2023   CHOL 144 10/31/2023   TRIG 103 10/31/2023   HDL 54 10/31/2023   LDLCALC 71 10/31/2023   ALT 29 11/05/2023   AST 18 11/05/2023   NA 138 11/05/2023   K 5.3 (H) 11/05/2023   CL 98 11/05/2023   CREATININE 1.15 11/05/2023   BUN 22 11/05/2023   CO2 24 11/05/2023   TSH 1.420 07/25/2023  HGBA1C 7.7 (H) 10/31/2023    Results for orders placed or performed in visit on 11/25/23  HM DIABETES EYE EXAM   Collection Time: 11/18/23 12:00 AM  Result Value Ref Range   HM Diabetic Eye Exam No Retinopathy No Retinopathy  .  Assessment & Plan:   Assessment & Plan Diabetic polyneuropathy associated with type 2 diabetes mellitus (HCC) Control: worsened Recommend check sugars twice daily  Recommend check feet daily. Recommend annual eye exams. Medicines:increase insulin to 26 U daily.  Increase insulin by 2 U every 3 days until sugars less than 140.  Check sugar twice daily before breakfast and supper.  Continue actos  30 mg daily, farxiga  10 mg daily, and metformin  1000 mg twice daily.  Continue to work on eating a healthy diet and exercise.  Labs drawn today.    Orders:   POCT glycosylated hemoglobin (Hb A1C)   insulin degludec  (TRESIBA  FLEXTOUCH) 200 UNIT/ML FlexTouch Pen; Inject 30 Units into the skin at  bedtime.   Comprehensive metabolic panel with GFR  History of tobacco abuse  Orders:   CT CHEST LUNG CA SCREEN LOW DOSE W/O CM; Future  Hypertension associated with diabetes (HCC) Well controlled.  No changes to medicines. Continue losartan  100 mg daily.  Continue to work on eating a healthy diet and exercise.  Labs drawn today.       Mixed hyperlipidemia At goal. Continue lipitor 20 mg daily.  Continue to work on eating a healthy diet and exercise.         Gastroesophageal reflux disease without esophagitis Continue nexium  40 mg twice daily.      Encounter for immunization  Orders:   Flu vaccine HIGH DOSE PF(Fluzone Trivalent)    Body mass index is 26.9 kg/m.   Meds ordered this encounter  Medications   insulin degludec  (TRESIBA  FLEXTOUCH) 200 UNIT/ML FlexTouch Pen    Sig: Inject 30 Units into the skin at bedtime.    Dispense:  6 mL    Refill:  2    Orders Placed This Encounter  Procedures   CT CHEST LUNG CA SCREEN LOW DOSE W/O CM   Flu vaccine HIGH DOSE PF(Fluzone Trivalent)   Comprehensive metabolic panel with GFR   POCT glycosylated hemoglobin (Hb A1C)       Follow-up: Return in about 3 months (around 05/15/2024) for chronic follow up, awv with kim.  An After Visit Summary was printed and given to the patient.  Abigail Free, MD [facility] [phone]

## 2024-02-13 NOTE — Assessment & Plan Note (Signed)
 At goal. Continue lipitor 20 mg daily.  Continue to work on eating a healthy diet and exercise.

## 2024-02-13 NOTE — Assessment & Plan Note (Signed)
Well controlled.  No changes to medicines. Continue losartan 100 mg daily.  Continue to work on eating a healthy diet and exercise.  Labs drawn today.  

## 2024-02-13 NOTE — Assessment & Plan Note (Signed)
 Control: worsened Recommend check sugars twice daily  Recommend check feet daily. Recommend annual eye exams. Medicines:increase insulin to 26 U daily.  Increase insulin by 2 U every 3 days until sugars less than 140.  Check sugar twice daily before breakfast and supper.  Continue actos  30 mg daily, farxiga  10 mg daily, and metformin  1000 mg twice daily.  Continue to work on eating a healthy diet and exercise.  Labs drawn today.    Orders:   POCT glycosylated hemoglobin (Hb A1C)   insulin degludec  (TRESIBA  FLEXTOUCH) 200 UNIT/ML FlexTouch Pen; Inject 30 Units into the skin at bedtime.   Comprehensive metabolic panel with GFR

## 2024-02-14 ENCOUNTER — Ambulatory Visit: Payer: Self-pay | Admitting: Family Medicine

## 2024-02-26 ENCOUNTER — Ambulatory Visit

## 2024-03-01 ENCOUNTER — Ambulatory Visit (HOSPITAL_BASED_OUTPATIENT_CLINIC_OR_DEPARTMENT_OTHER)

## 2024-03-01 DIAGNOSIS — H40013 Open angle with borderline findings, low risk, bilateral: Secondary | ICD-10-CM | POA: Diagnosis not present

## 2024-03-29 ENCOUNTER — Other Ambulatory Visit: Payer: Self-pay | Admitting: Family Medicine

## 2024-03-29 DIAGNOSIS — E1142 Type 2 diabetes mellitus with diabetic polyneuropathy: Secondary | ICD-10-CM

## 2024-04-11 ENCOUNTER — Other Ambulatory Visit: Payer: Self-pay | Admitting: Family Medicine

## 2024-04-11 DIAGNOSIS — E782 Mixed hyperlipidemia: Secondary | ICD-10-CM

## 2024-04-20 ENCOUNTER — Ambulatory Visit

## 2024-04-26 ENCOUNTER — Other Ambulatory Visit: Payer: Self-pay | Admitting: Family Medicine

## 2024-04-26 DIAGNOSIS — E1159 Type 2 diabetes mellitus with other circulatory complications: Secondary | ICD-10-CM

## 2024-04-26 DIAGNOSIS — E1142 Type 2 diabetes mellitus with diabetic polyneuropathy: Secondary | ICD-10-CM

## 2024-05-21 ENCOUNTER — Ambulatory Visit: Admitting: Family Medicine

## 2024-05-24 ENCOUNTER — Ambulatory Visit: Admitting: Family Medicine

## 2024-05-25 ENCOUNTER — Ambulatory Visit: Admitting: Family Medicine

## 2024-05-25 DIAGNOSIS — E782 Mixed hyperlipidemia: Secondary | ICD-10-CM

## 2024-05-25 DIAGNOSIS — I152 Hypertension secondary to endocrine disorders: Secondary | ICD-10-CM

## 2024-06-01 ENCOUNTER — Ambulatory Visit
